# Patient Record
Sex: Female | Born: 1935 | Race: Black or African American | Hispanic: No | Smoking: Never smoker
Health system: Southern US, Community
[De-identification: ages and names within clinical notes are randomized; demographics above are authoritative.]

## PROBLEM LIST (undated history)

## (undated) DIAGNOSIS — I10 Essential (primary) hypertension: Secondary | ICD-10-CM

## (undated) DIAGNOSIS — C169 Malignant neoplasm of stomach, unspecified: Secondary | ICD-10-CM

## (undated) DIAGNOSIS — I639 Cerebral infarction, unspecified: Secondary | ICD-10-CM

## (undated) DIAGNOSIS — I4891 Unspecified atrial fibrillation: Secondary | ICD-10-CM

## (undated) DIAGNOSIS — E785 Hyperlipidemia, unspecified: Secondary | ICD-10-CM

## (undated) DIAGNOSIS — I2510 Atherosclerotic heart disease of native coronary artery without angina pectoris: Secondary | ICD-10-CM

## (undated) DIAGNOSIS — N183 Chronic kidney disease, stage 3 unspecified: Secondary | ICD-10-CM

---

## 2019-08-02 ENCOUNTER — Encounter (HOSPITAL_COMMUNITY)
Admission: EM | Disposition: A | Discharge status: Rehabilitation Facility | Payer: Self-pay | Source: Home / Self Care | Attending: Neurology

## 2019-08-02 ENCOUNTER — Emergency Department (HOSPITAL_COMMUNITY): Payer: MEDICARE | Admitting: Certified Registered Nurse Anesthetist

## 2019-08-02 ENCOUNTER — Inpatient Hospital Stay (HOSPITAL_COMMUNITY): Payer: MEDICARE

## 2019-08-02 ENCOUNTER — Emergency Department (HOSPITAL_COMMUNITY): Payer: MEDICARE

## 2019-08-02 ENCOUNTER — Inpatient Hospital Stay (HOSPITAL_COMMUNITY)
Admission: EM | Admit: 2019-08-02 | Discharge: 2019-08-07 | DRG: 024 | Disposition: A | Discharge status: Rehabilitation Facility | Payer: MEDICARE | Attending: Neurology | Admitting: Neurology

## 2019-08-02 ENCOUNTER — Encounter (HOSPITAL_COMMUNITY): Payer: Self-pay

## 2019-08-02 ENCOUNTER — Other Ambulatory Visit: Payer: Self-pay

## 2019-08-02 DIAGNOSIS — R471 Dysarthria and anarthria: Secondary | ICD-10-CM | POA: Diagnosis present

## 2019-08-02 DIAGNOSIS — R29705 NIHSS score 5: Secondary | ICD-10-CM | POA: Diagnosis present

## 2019-08-02 DIAGNOSIS — Z79899 Other long term (current) drug therapy: Secondary | ICD-10-CM

## 2019-08-02 DIAGNOSIS — R270 Ataxia, unspecified: Secondary | ICD-10-CM | POA: Diagnosis present

## 2019-08-02 DIAGNOSIS — I444 Left anterior fascicular block: Secondary | ICD-10-CM | POA: Diagnosis present

## 2019-08-02 DIAGNOSIS — Z8042 Family history of malignant neoplasm of prostate: Secondary | ICD-10-CM

## 2019-08-02 DIAGNOSIS — I63411 Cerebral infarction due to embolism of right middle cerebral artery: Secondary | ICD-10-CM | POA: Diagnosis present

## 2019-08-02 DIAGNOSIS — R4701 Aphasia: Secondary | ICD-10-CM | POA: Diagnosis present

## 2019-08-02 DIAGNOSIS — I480 Paroxysmal atrial fibrillation: Secondary | ICD-10-CM | POA: Diagnosis present

## 2019-08-02 DIAGNOSIS — K5900 Constipation, unspecified: Secondary | ICD-10-CM

## 2019-08-02 DIAGNOSIS — R414 Neurologic neglect syndrome: Secondary | ICD-10-CM | POA: Diagnosis present

## 2019-08-02 DIAGNOSIS — Z6828 Body mass index (BMI) 28.0-28.9, adult: Secondary | ICD-10-CM | POA: Diagnosis not present

## 2019-08-02 DIAGNOSIS — I63311 Cerebral infarction due to thrombosis of right middle cerebral artery: Secondary | ICD-10-CM

## 2019-08-02 DIAGNOSIS — I161 Hypertensive emergency: Secondary | ICD-10-CM | POA: Diagnosis not present

## 2019-08-02 DIAGNOSIS — I129 Hypertensive chronic kidney disease with stage 1 through stage 4 chronic kidney disease, or unspecified chronic kidney disease: Secondary | ICD-10-CM | POA: Diagnosis present

## 2019-08-02 DIAGNOSIS — E8809 Other disorders of plasma-protein metabolism, not elsewhere classified: Secondary | ICD-10-CM | POA: Diagnosis present

## 2019-08-02 DIAGNOSIS — I69398 Other sequelae of cerebral infarction: Secondary | ICD-10-CM | POA: Diagnosis present

## 2019-08-02 DIAGNOSIS — I639 Cerebral infarction, unspecified: Secondary | ICD-10-CM | POA: Diagnosis present

## 2019-08-02 DIAGNOSIS — I4891 Unspecified atrial fibrillation: Secondary | ICD-10-CM | POA: Diagnosis present

## 2019-08-02 DIAGNOSIS — R0989 Other specified symptoms and signs involving the circulatory and respiratory systems: Secondary | ICD-10-CM | POA: Diagnosis not present

## 2019-08-02 DIAGNOSIS — Z85028 Personal history of other malignant neoplasm of stomach: Secondary | ICD-10-CM | POA: Diagnosis not present

## 2019-08-02 DIAGNOSIS — R4702 Dysphasia: Secondary | ICD-10-CM | POA: Diagnosis present

## 2019-08-02 DIAGNOSIS — Z20822 Contact with and (suspected) exposure to covid-19: Secondary | ICD-10-CM | POA: Diagnosis present

## 2019-08-02 DIAGNOSIS — I2510 Atherosclerotic heart disease of native coronary artery without angina pectoris: Secondary | ICD-10-CM | POA: Diagnosis present

## 2019-08-02 DIAGNOSIS — I10 Essential (primary) hypertension: Secondary | ICD-10-CM | POA: Diagnosis not present

## 2019-08-02 DIAGNOSIS — Z7901 Long term (current) use of anticoagulants: Secondary | ICD-10-CM | POA: Diagnosis not present

## 2019-08-02 DIAGNOSIS — H5340 Unspecified visual field defects: Secondary | ICD-10-CM | POA: Diagnosis present

## 2019-08-02 DIAGNOSIS — N183 Chronic kidney disease, stage 3 unspecified: Secondary | ICD-10-CM | POA: Diagnosis present

## 2019-08-02 DIAGNOSIS — Z888 Allergy status to other drugs, medicaments and biological substances status: Secondary | ICD-10-CM | POA: Diagnosis not present

## 2019-08-02 DIAGNOSIS — Z886 Allergy status to analgesic agent status: Secondary | ICD-10-CM | POA: Diagnosis not present

## 2019-08-02 DIAGNOSIS — E785 Hyperlipidemia, unspecified: Secondary | ICD-10-CM | POA: Diagnosis present

## 2019-08-02 DIAGNOSIS — E7800 Pure hypercholesterolemia, unspecified: Secondary | ICD-10-CM | POA: Diagnosis not present

## 2019-08-02 DIAGNOSIS — R29810 Facial weakness: Secondary | ICD-10-CM | POA: Diagnosis present

## 2019-08-02 DIAGNOSIS — Z7902 Long term (current) use of antithrombotics/antiplatelets: Secondary | ICD-10-CM | POA: Diagnosis not present

## 2019-08-02 DIAGNOSIS — N1832 Chronic kidney disease, stage 3b: Secondary | ICD-10-CM | POA: Diagnosis present

## 2019-08-02 DIAGNOSIS — R2689 Other abnormalities of gait and mobility: Secondary | ICD-10-CM | POA: Diagnosis present

## 2019-08-02 DIAGNOSIS — Z8673 Personal history of transient ischemic attack (TIA), and cerebral infarction without residual deficits: Secondary | ICD-10-CM

## 2019-08-02 DIAGNOSIS — E440 Moderate protein-calorie malnutrition: Secondary | ICD-10-CM | POA: Diagnosis present

## 2019-08-02 DIAGNOSIS — D72829 Elevated white blood cell count, unspecified: Secondary | ICD-10-CM | POA: Diagnosis present

## 2019-08-02 DIAGNOSIS — R413 Other amnesia: Secondary | ICD-10-CM | POA: Diagnosis present

## 2019-08-02 DIAGNOSIS — I4892 Unspecified atrial flutter: Secondary | ICD-10-CM | POA: Diagnosis present

## 2019-08-02 DIAGNOSIS — Z885 Allergy status to narcotic agent status: Secondary | ICD-10-CM | POA: Diagnosis not present

## 2019-08-02 DIAGNOSIS — I6389 Other cerebral infarction: Secondary | ICD-10-CM | POA: Diagnosis not present

## 2019-08-02 DIAGNOSIS — E46 Unspecified protein-calorie malnutrition: Secondary | ICD-10-CM | POA: Diagnosis not present

## 2019-08-02 DIAGNOSIS — I63511 Cerebral infarction due to unspecified occlusion or stenosis of right middle cerebral artery: Secondary | ICD-10-CM | POA: Diagnosis not present

## 2019-08-02 DIAGNOSIS — D62 Acute posthemorrhagic anemia: Secondary | ICD-10-CM | POA: Diagnosis present

## 2019-08-02 DIAGNOSIS — K5901 Slow transit constipation: Secondary | ICD-10-CM | POA: Diagnosis not present

## 2019-08-02 HISTORY — PX: IR CT HEAD LTD: IMG2386

## 2019-08-02 HISTORY — DX: Malignant neoplasm of stomach, unspecified: C16.9

## 2019-08-02 HISTORY — PX: IR PERCUTANEOUS ART THROMBECTOMY/INFUSION INTRACRANIAL INC DIAG ANGIO: IMG6087

## 2019-08-02 HISTORY — DX: Essential (primary) hypertension: I10

## 2019-08-02 HISTORY — DX: Chronic kidney disease, stage 3 unspecified: N18.30

## 2019-08-02 HISTORY — PX: RADIOLOGY WITH ANESTHESIA: SHX6223

## 2019-08-02 HISTORY — DX: Cerebral infarction, unspecified: I63.9

## 2019-08-02 HISTORY — DX: Hyperlipidemia, unspecified: E78.5

## 2019-08-02 HISTORY — DX: Unspecified atrial fibrillation: I48.91

## 2019-08-02 HISTORY — DX: Atherosclerotic heart disease of native coronary artery without angina pectoris: I25.10

## 2019-08-02 LAB — I-STAT CHEM 8, ED
BUN: 8 mg/dL (ref 8–23)
Calcium, Ion: 1.18 mmol/L (ref 1.15–1.40)
Chloride: 101 mmol/L (ref 98–111)
Creatinine, Ser: 1.2 mg/dL — ABNORMAL HIGH (ref 0.44–1.00)
Glucose, Bld: 120 mg/dL — ABNORMAL HIGH (ref 70–99)
HCT: 37 % (ref 36.0–46.0)
Hemoglobin: 12.6 g/dL (ref 12.0–15.0)
Potassium: 3.8 mmol/L (ref 3.5–5.1)
Sodium: 142 mmol/L (ref 135–145)
TCO2: 28 mmol/L (ref 22–32)

## 2019-08-02 LAB — DIFFERENTIAL
Abs Immature Granulocytes: 0.02 10*3/uL (ref 0.00–0.07)
Basophils Absolute: 0.1 10*3/uL (ref 0.0–0.1)
Basophils Relative: 1 %
Eosinophils Absolute: 0.1 10*3/uL (ref 0.0–0.5)
Eosinophils Relative: 1 %
Immature Granulocytes: 0 %
Lymphocytes Relative: 22 %
Lymphs Abs: 1.5 10*3/uL (ref 0.7–4.0)
Monocytes Absolute: 0.7 10*3/uL (ref 0.1–1.0)
Monocytes Relative: 11 %
Neutro Abs: 4.4 10*3/uL (ref 1.7–7.7)
Neutrophils Relative %: 65 %

## 2019-08-02 LAB — URINALYSIS, ROUTINE W REFLEX MICROSCOPIC
Bilirubin Urine: NEGATIVE
Glucose, UA: NEGATIVE mg/dL
Hgb urine dipstick: NEGATIVE
Ketones, ur: NEGATIVE mg/dL
Leukocytes,Ua: NEGATIVE
Nitrite: NEGATIVE
Protein, ur: NEGATIVE mg/dL
Specific Gravity, Urine: 1.009 (ref 1.005–1.030)
pH: 8 (ref 5.0–8.0)

## 2019-08-02 LAB — CBC
HCT: 38.7 % (ref 36.0–46.0)
Hemoglobin: 12.3 g/dL (ref 12.0–15.0)
MCH: 28.6 pg (ref 26.0–34.0)
MCHC: 31.8 g/dL (ref 30.0–36.0)
MCV: 90 fL (ref 80.0–100.0)
Platelets: 249 10*3/uL (ref 150–400)
RBC: 4.3 MIL/uL (ref 3.87–5.11)
RDW: 14 % (ref 11.5–15.5)
WBC: 6.7 10*3/uL (ref 4.0–10.5)
nRBC: 0 % (ref 0.0–0.2)

## 2019-08-02 LAB — COMPREHENSIVE METABOLIC PANEL
ALT: 18 U/L (ref 0–44)
AST: 28 U/L (ref 15–41)
Albumin: 4.1 g/dL (ref 3.5–5.0)
Alkaline Phosphatase: 85 U/L (ref 38–126)
Anion gap: 11 (ref 5–15)
BUN: 9 mg/dL (ref 8–23)
CO2: 26 mmol/L (ref 22–32)
Calcium: 9.8 mg/dL (ref 8.9–10.3)
Chloride: 104 mmol/L (ref 98–111)
Creatinine, Ser: 1.2 mg/dL — ABNORMAL HIGH (ref 0.44–1.00)
GFR calc Af Amer: 48 mL/min — ABNORMAL LOW (ref >60)
GFR calc non Af Amer: 41 mL/min — ABNORMAL LOW (ref >60)
Glucose, Bld: 126 mg/dL — ABNORMAL HIGH (ref 70–99)
Potassium: 4.2 mmol/L (ref 3.5–5.1)
Sodium: 141 mmol/L (ref 135–145)
Total Bilirubin: 0.5 mg/dL (ref 0.3–1.2)
Total Protein: 7.4 g/dL (ref 6.5–8.1)

## 2019-08-02 LAB — PROTIME-INR
INR: 1 (ref 0.8–1.2)
Prothrombin Time: 13.1 seconds (ref 11.4–15.2)

## 2019-08-02 LAB — CBG MONITORING, ED: Glucose-Capillary: 125 mg/dL — ABNORMAL HIGH (ref 70–99)

## 2019-08-02 LAB — APTT: aPTT: 30 seconds (ref 24–36)

## 2019-08-02 LAB — SARS CORONAVIRUS 2 BY RT PCR (HOSPITAL ORDER, PERFORMED IN ~~LOC~~ HOSPITAL LAB): SARS Coronavirus 2: NEGATIVE

## 2019-08-02 IMAGING — CT CT HEAD CODE STROKE
3 series · 16 of 47 positions shown, 19 images · non-contrast
Comparison: None.

CLINICAL DATA: Code stroke. Worsening confusion. Recent stroke
treated at outside institution.

EXAM:
CT HEAD WITHOUT CONTRAST
TECHNIQUE: Contiguous axial images were obtained from the base of the skull
through the vertex without intravenous contrast.

[Series 2: head wo · axial · 0.47mm/px · z∈[-177,-52]mm · 10 of 30 slices shown, 13 images]
[im 3/30  brain]
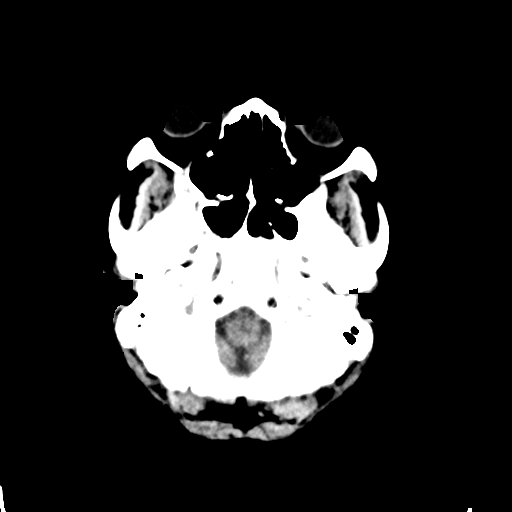
[im 3/30  bone]
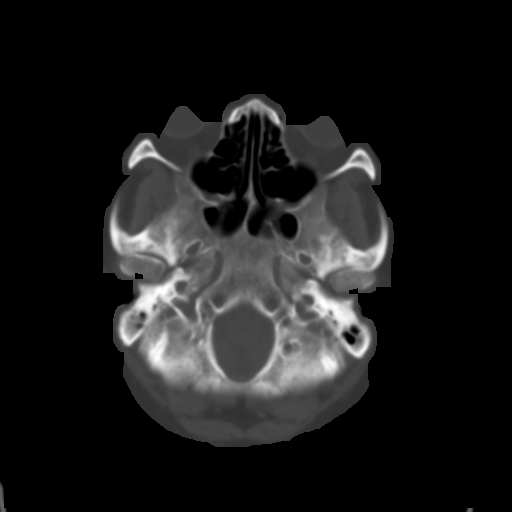
[im 6/30  brain]
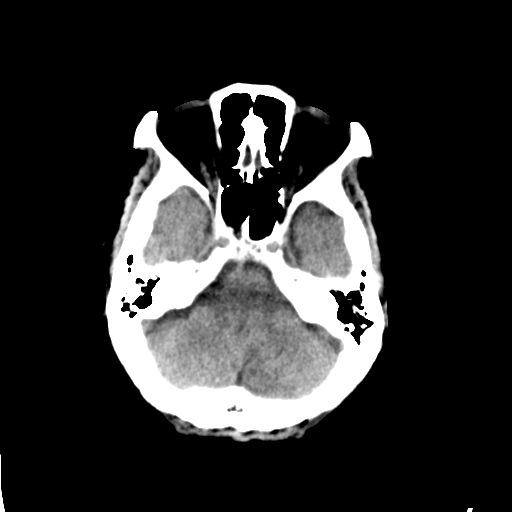
[im 9/30  brain]
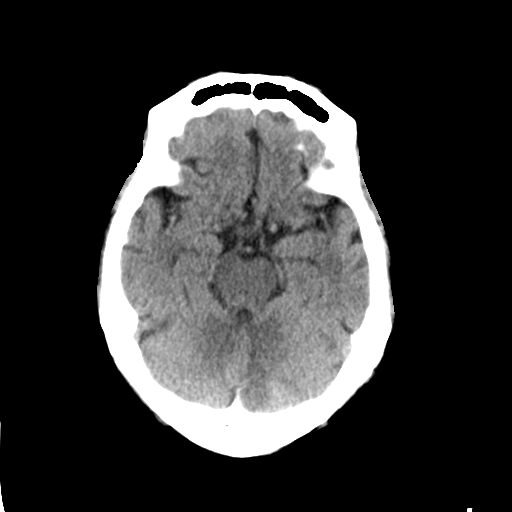
[im 11/30  brain]
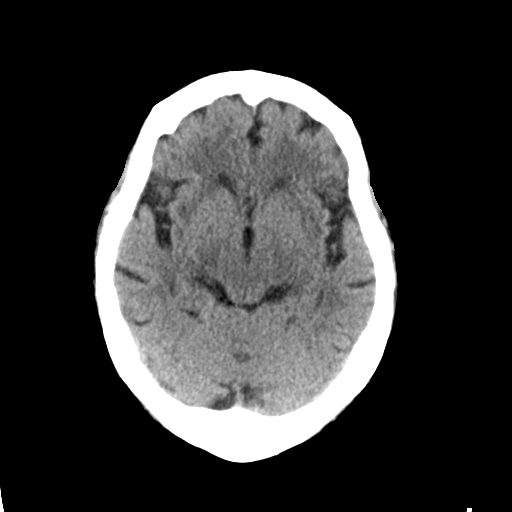
[im 14/30  brain]
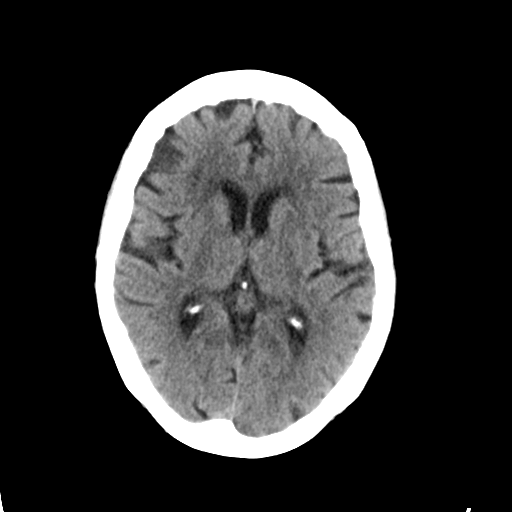
[im 14/30  bone]
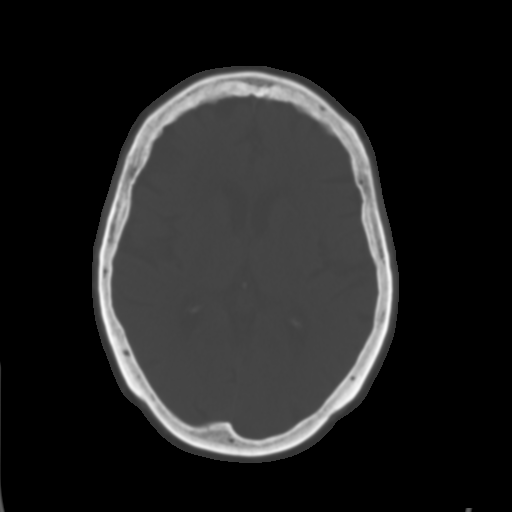
[im 17/30  brain]
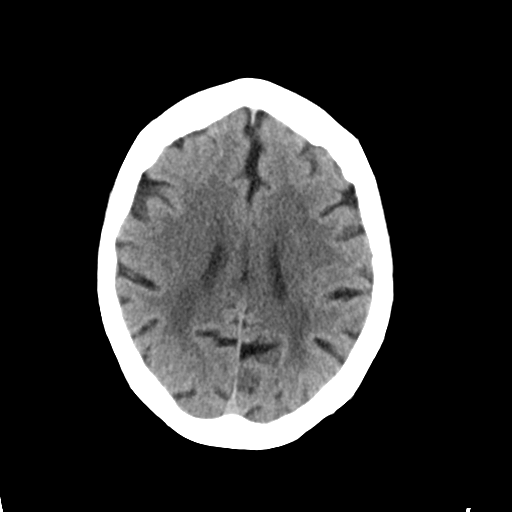
[im 20/30  brain]
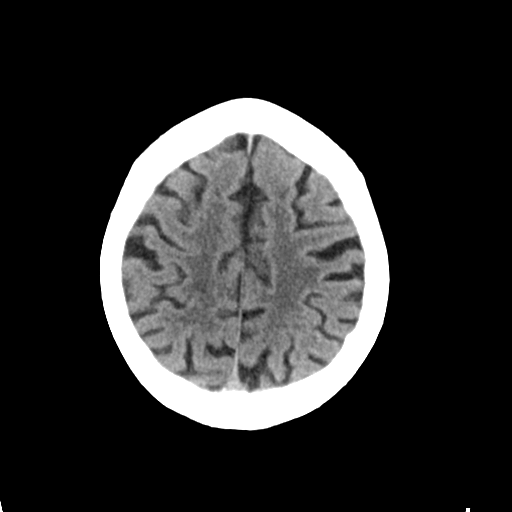
[im 23/30  brain]
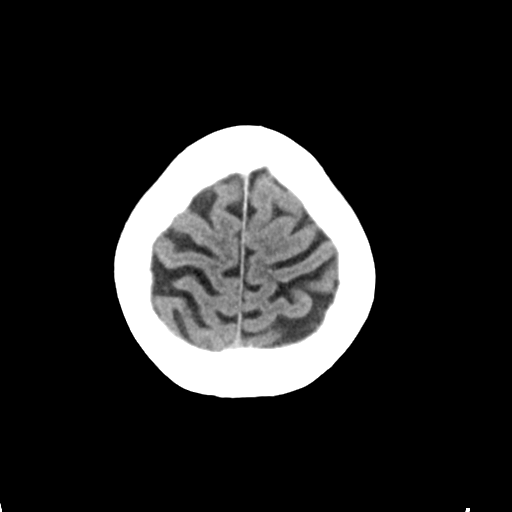
[im 25/30  brain]
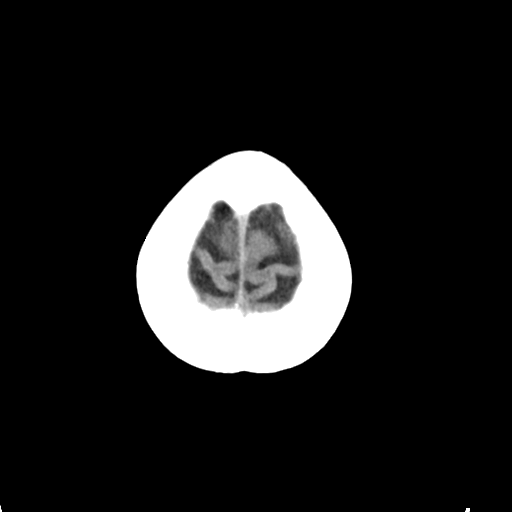
[im 25/30  bone]
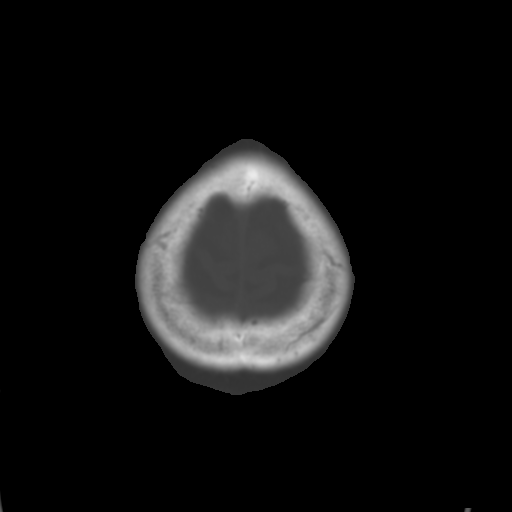
[im 28/30  brain]
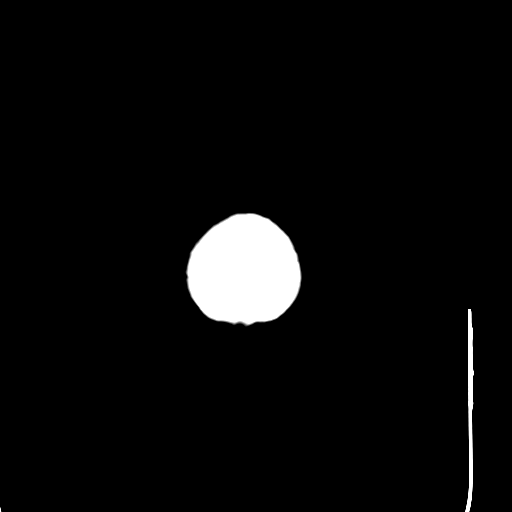

[Series 5: coronal soft tissue · coronal · 0.31mm/px · 3 of 67 slices shown]
[im 23/67  brain]
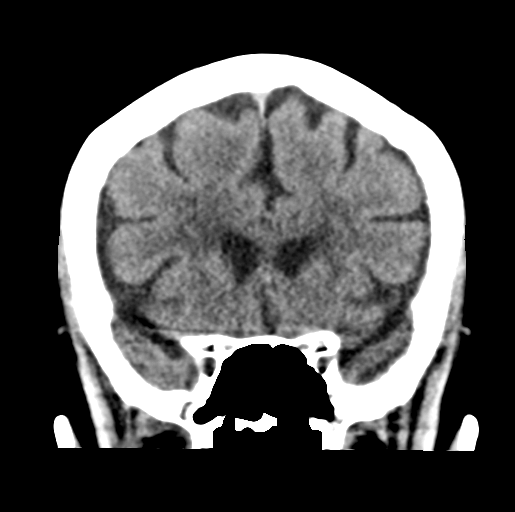
[im 30/67  brain]
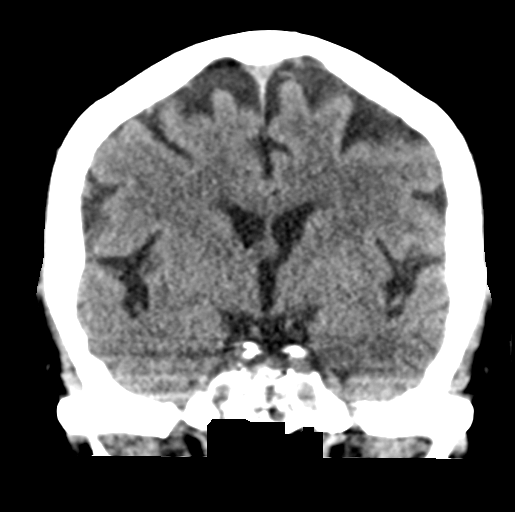
[im 37/67  brain]
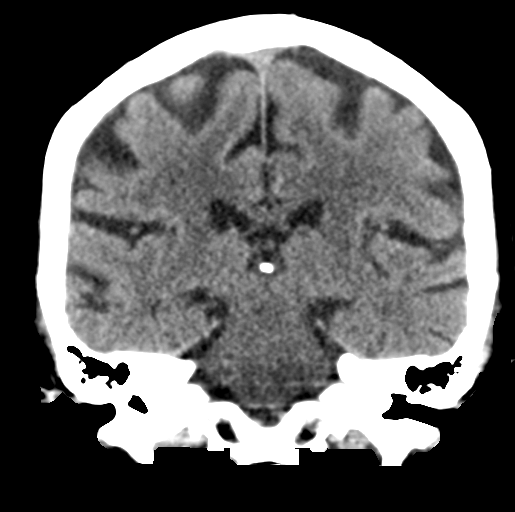

[Series 6: sagittal soft tissue · sagittal · 0.32mm/px · 3 of 54 slices shown]
[im 18/54  brain]
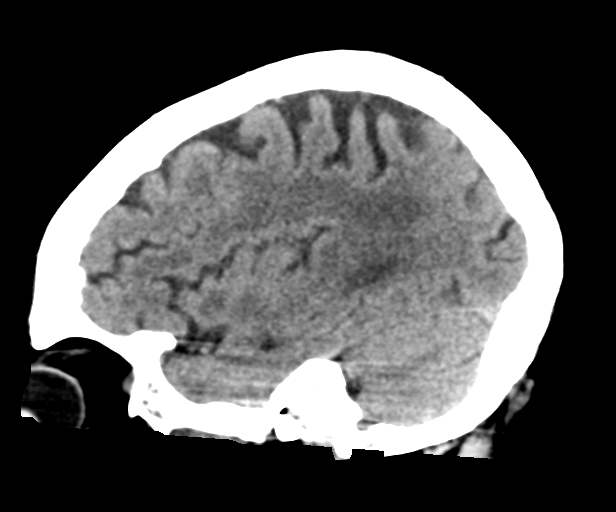
[im 27/54  brain]
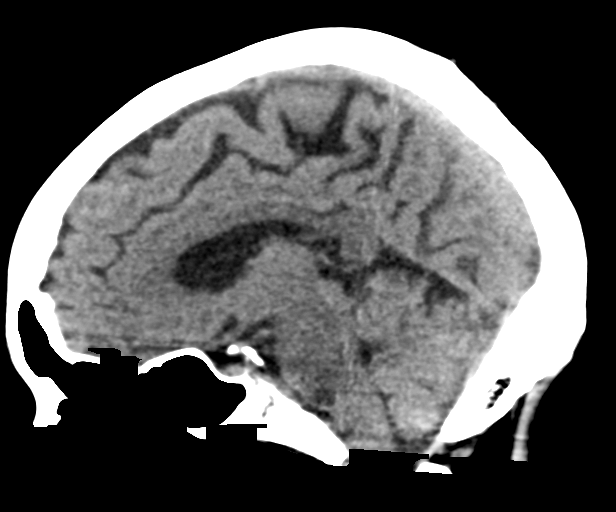
[im 36/54  brain]
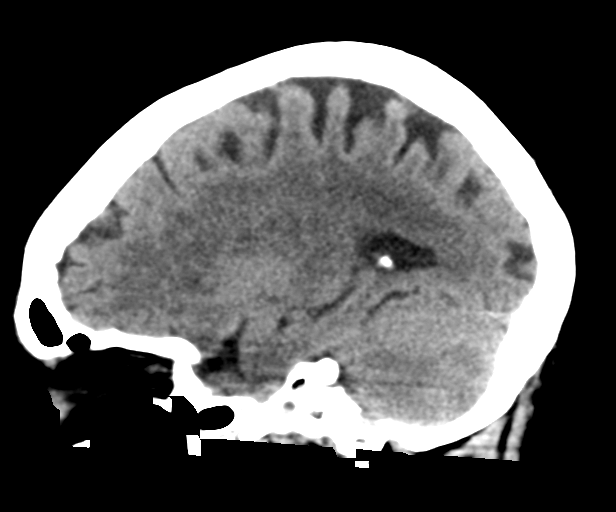

[16 of 47 positions shown; findings below may reference images not displayed]

FINDINGS: Brain: Age related volume loss. Chronic small-vessel ischemic
changes of the cerebral hemispheric white matter. Probable
low-density in the left cerebellum that could be an acute or
subacute infarction. No sign of hemorrhage, mass, hydrocephalus or
extra-axial collection.

Vascular: There is atherosclerotic calcification of the major
vessels at the base of the brain.

Skull: Negative

Sinuses/Orbits: Clear/normal

Other: None

ASPECTS (Alberta Stroke Program Early CT Score)

- Ganglionic level infarction (caudate, lentiform nuclei, internal
capsule, insula, M1-M3 cortex): 7

- Supraganglionic infarction (M4-M6 cortex): 3

Total score (0-10 with 10 being normal): 10
IMPRESSION: 1. Chronic small-vessel ischemic changes of the cerebral hemispheric
white matter. Probable acute or subacute infarction in the left
cerebellar hemisphere. No mass effect or hemorrhage.
2. These results were called by telephone at the time of
interpretation on [DATE] at [DATE] to provider Dr. CONDO, who
verbally acknowledged these results.
3. ASPECTS is

## 2019-08-02 IMAGING — CT CT ANGIO NECK
2 of 7 series · 8 of 33 positions shown · IV contrast (omnipaque)
Comparison: Head CT earlier same day

CLINICAL DATA: Confusion.  Abnormal head CT.

EXAM:
CT ANGIOGRAPHY HEAD AND NECK
TECHNIQUE: Multidetector CT imaging of the head and neck was performed using
the standard protocol during bolus administration of intravenous
contrast. Multiplanar CT image reconstructions and MIPs were
obtained to evaluate the vascular anatomy. Carotid stenosis
measurements (when applicable) are obtained utilizing NASCET
criteria, using the distal internal carotid diameter as the
denominator.
CONTRAST:  100mL OMNIPAQUE IOHEXOL 350 MG/ML SOLN

[Series 5: cta head neck · axial · 0.48mm/px · z∈[-240,-122]mm · 2 of 177 slices shown]
[im 59/177  soft-tissue]
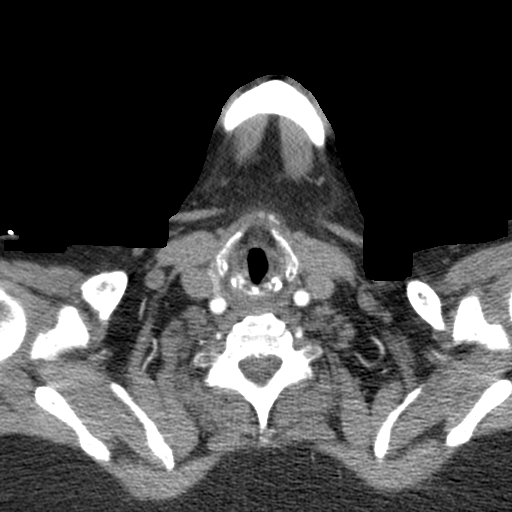
[im 118/177  soft-tissue]
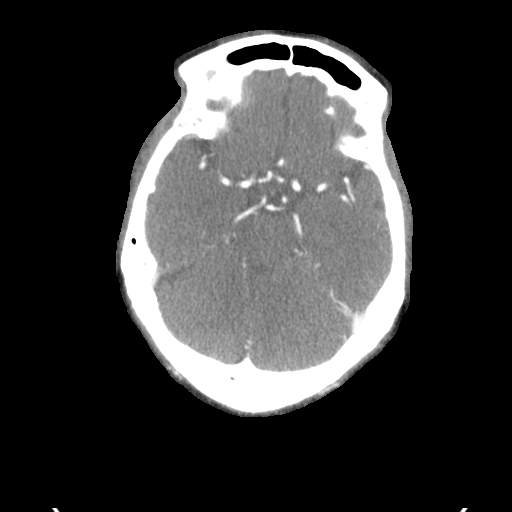

[Series 7: ax thin · axial · 0.40mm/px · z∈[-305,-55]mm · 6 of 351 slices shown]
[im 51/351  soft-tissue]
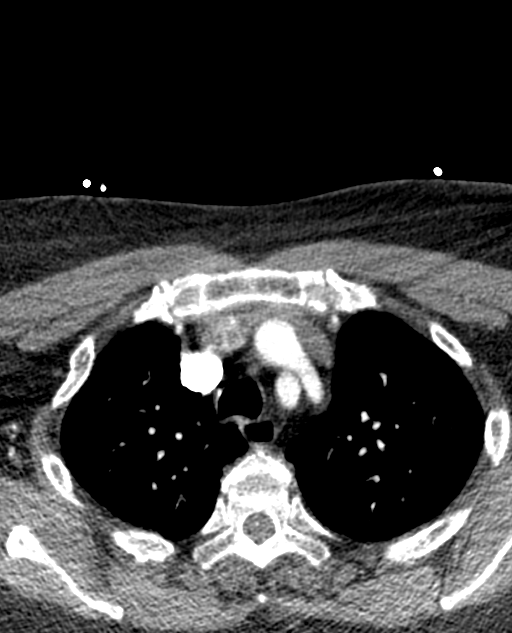
[im 101/351  bone]
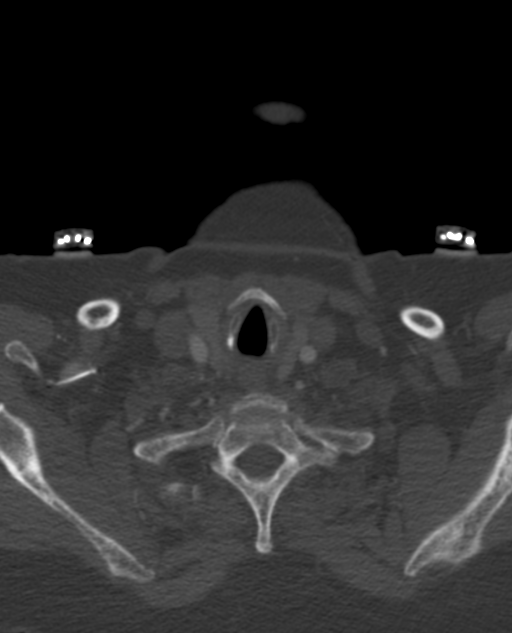
[im 151/351  soft-tissue]
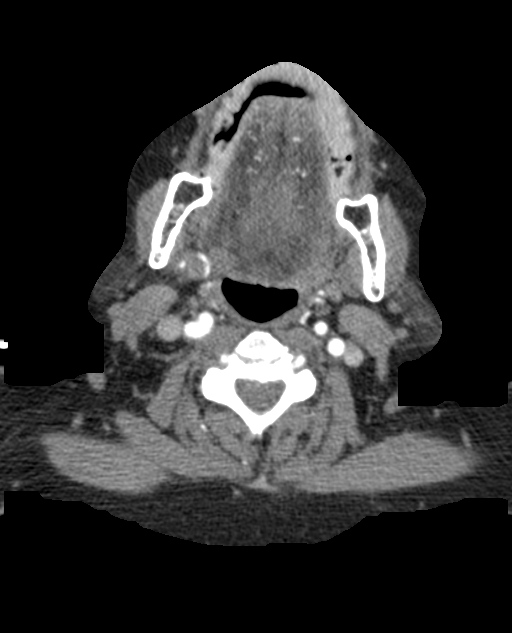
[im 201/351  bone]
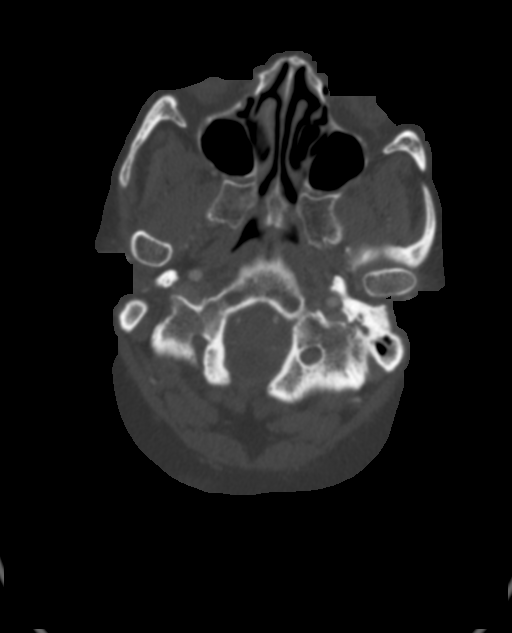
[im 251/351  soft-tissue]
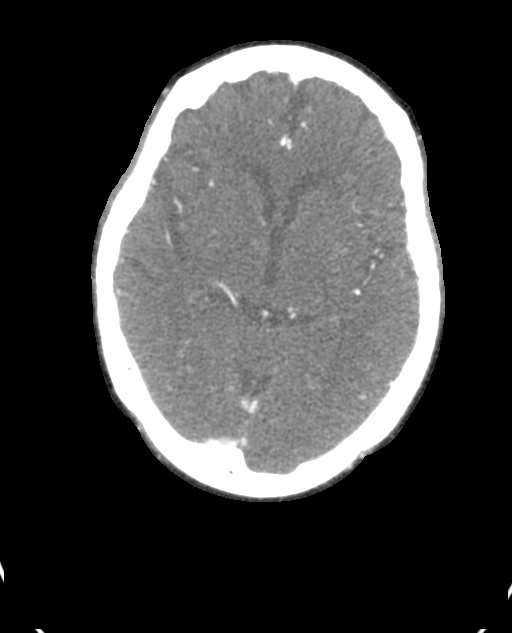
[im 301/351  bone]
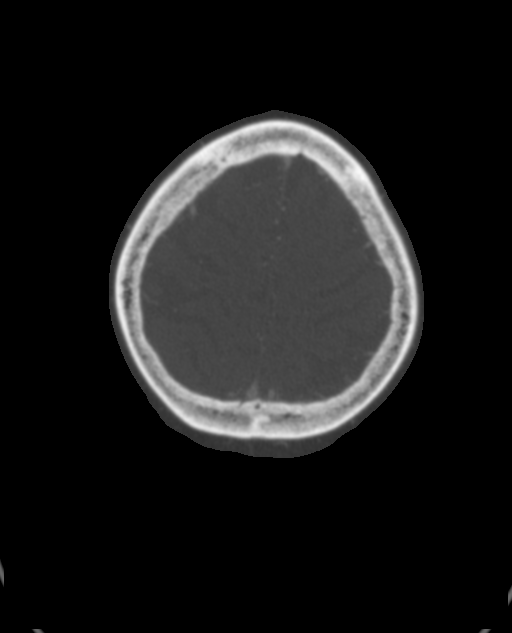

[8 of 33 positions shown; findings below may reference images not displayed]

FINDINGS: CTA NECK FINDINGS

Aortic arch: Aortic atherosclerosis. No aneurysm or dissection.
Branching pattern is normal without origin stenosis.

Right carotid system: Common carotid artery widely patent to the
bifurcation. Carotid bifurcation is normal without atherosclerotic
disease or stenosis. Cervical ICA is widely patent.

Left carotid system: Common carotid artery widely patent to the
bifurcation. Carotid bifurcation is normal. Cervical ICA is normal.

Vertebral arteries: Both vertebral artery origins are widely patent.
The left is dominant. Both vertebral arteries are patent through the
cervical region to the foramen magnum.

Skeleton: Ordinary cervical spondylosis.

Other neck: No mass or lymphadenopathy.

Upper chest: Normal

Review of the MIP images confirms the above findings

CTA HEAD FINDINGS

Anterior circulation: Both internal carotid arteries widely patent
through the skull base and siphon regions. The anterior and middle
cerebral vessels are patent proximally. There is occlusion of a
proximal right M2 branch vessel. No other anterior circulation
finding. No aneurysm or vascular malformation.

Posterior circulation: Both vertebral arteries are patent through
the foramen magnum to the basilar. Patent posterior communicating
arteries on each side. No basilar stenosis. Superior cerebellar and
posterior cerebral arteries show flow. Primary anterior circulation
supply of the posterior cerebral arteries.

Venous sinuses: Patent and normal.

Anatomic variants: None significant.

Review of the MIP images confirms the above findings
IMPRESSION: Occlusion of a right proximal M2 branch. No other abnormal anterior
circulation finding.

Posterior circulation intracranial branch vessels are intact.

No carotid bifurcation disease.  No vertebral artery origin disease.

Minimal aortic atherosclerosis.

These results were called by telephone at the time of interpretation
on [DATE] at [DATE] to Dr. HESSAM,who verbally acknowledged these
results.

## 2019-08-02 IMAGING — XA IR PERCUTANEOUS ART THORMBECTOMY/INFUSION INTRACRANIAL INCLUDE D
8 series · 12 of 24 positions shown · non-contrast
Comparison: CT/CT angiogram of the head and neck [DATE]

INDICATION: 87-year-old female with past medical history significant for recent
cerebellar stroke, atrial fibrillation, hypertension and GI cancer.
She presented to an outside hospital with confusion and aphasia,
NIHSS 5. Head CT showed no acute right MCA territory infarct with
known left cerebellar stroke. CT angiogram of the head and neck
showed a right M2/MCA occlusion. She was taken to our service for an
emergency diagnostic cerebral angiogram and mechanical thrombectomy.

EXAM:
Diagnostic cerebral angiogram and mechanical thrombectomy.
TECHNIQUE: Informed written consent was obtained from the patient's daughter
after a thorough discussion of the procedural risks, benefits and
alternatives. All questions were addressed. Maximal Sterile Barrier
Technique was utilized including caps, mask, sterile gowns, sterile
gloves, sterile drape, hand hygiene and skin antiseptic. A timeout
was performed prior to the initiation of the procedure.

[Series 1: cerebral care 2 · 2 acquisitions, 1 frame shown (1 of 3)]
[im 1/2]
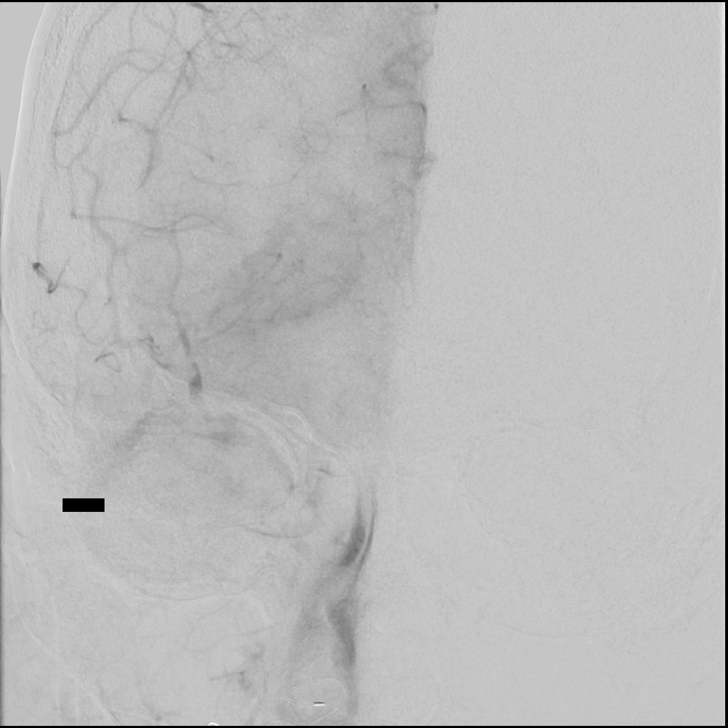

[Series 2: single · 1 of 2 slices shown]
[im 1/2]
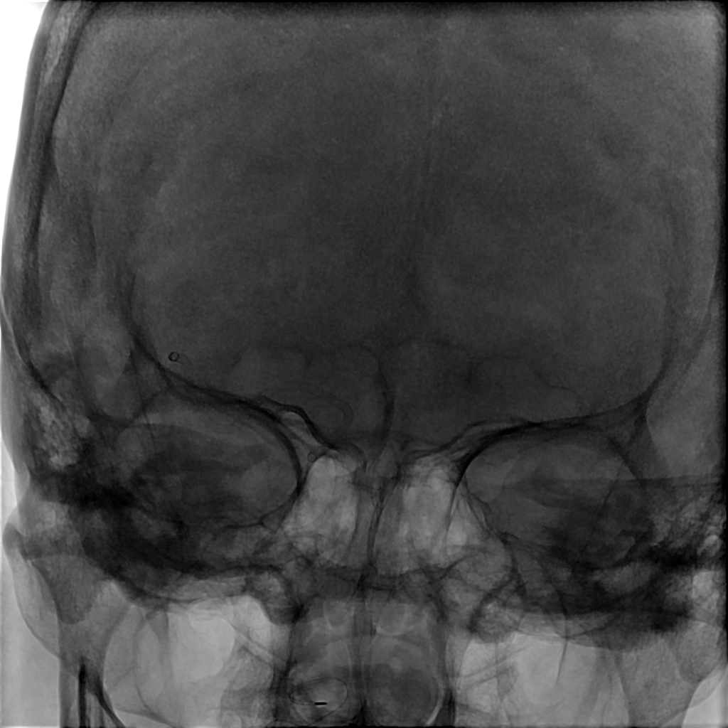

[Series 3: cerebral care 2 · 2 acquisitions, 1 frame shown (2 of 3)]
[im 1/2]
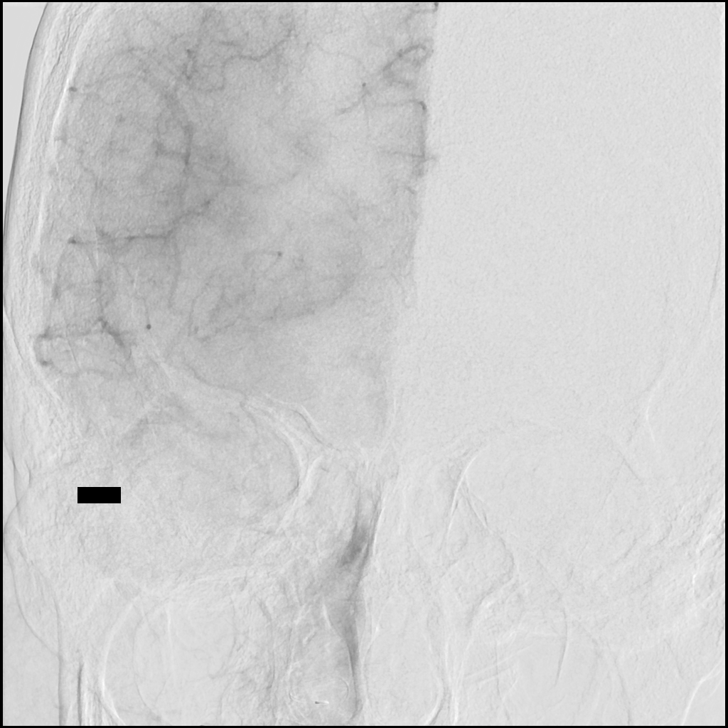

[Series 4: cerebral care 2 · 2 acquisitions, 1 frame shown (3 of 3)]
[im 1/2]
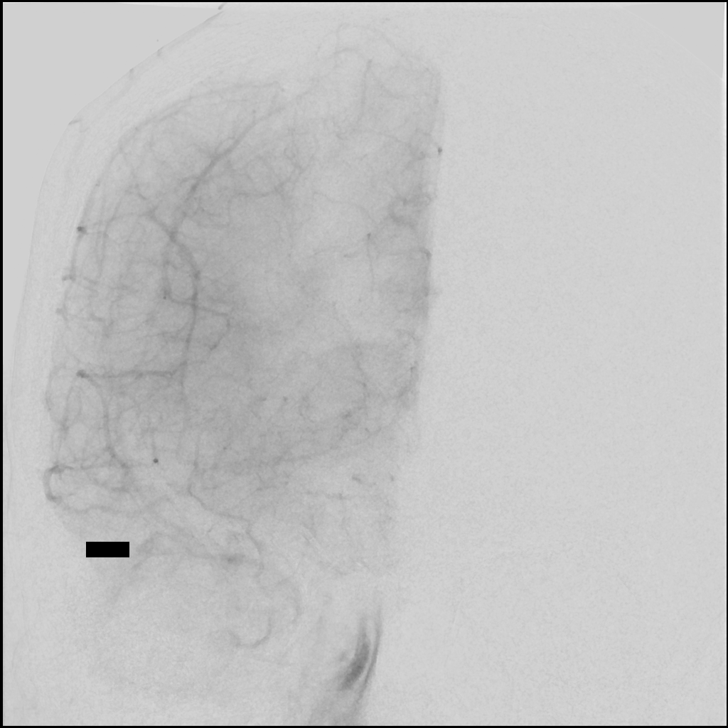

[Series 5: <mpr range> · axial · 5.0mm · 0.37mm/px · z∈[-244,-121]mm · 4 of 33 slices shown]
[im 3/33]
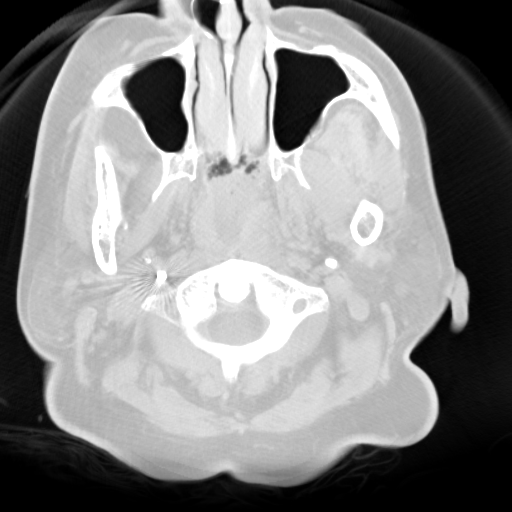
[im 13/33]
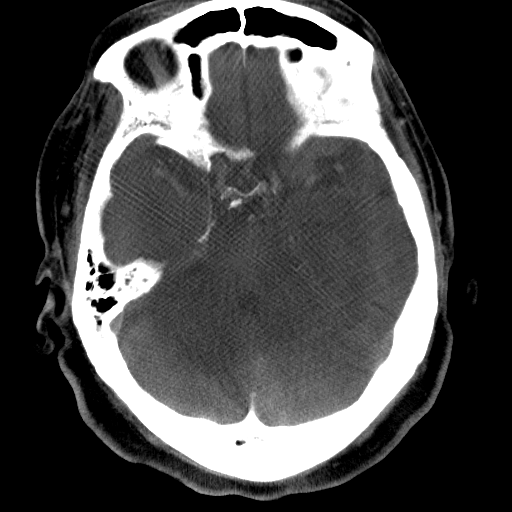
[im 20/33]
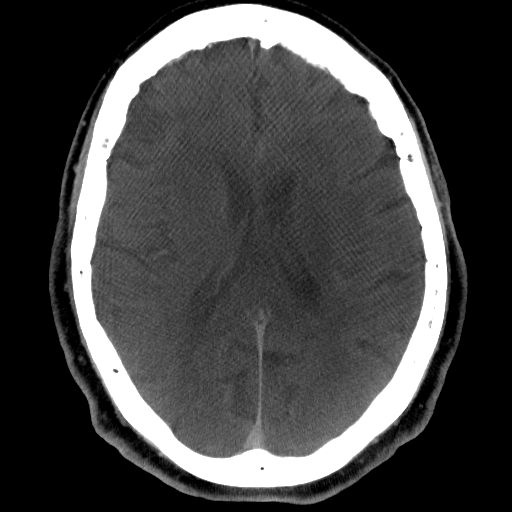
[im 28/33]
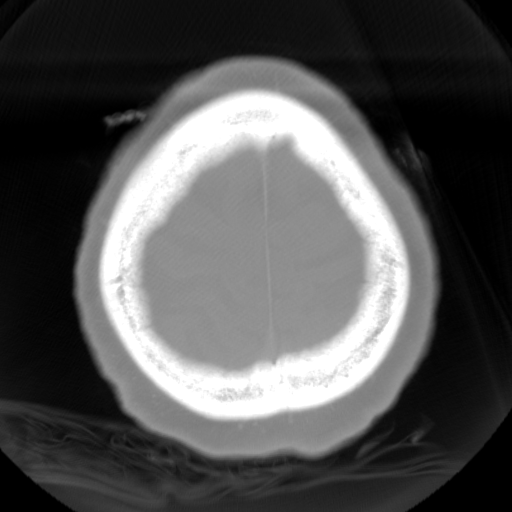

[Series 5: (id) bleed · 1 of 496 frames shown]
[frame 1/496]
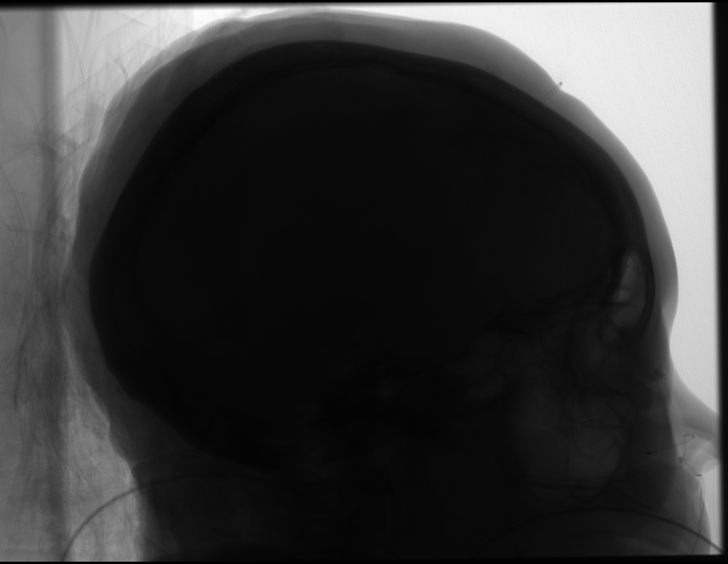

[Series 6: fl neuro n · 2 acquisitions, 2 frames shown]
[im 1/2]
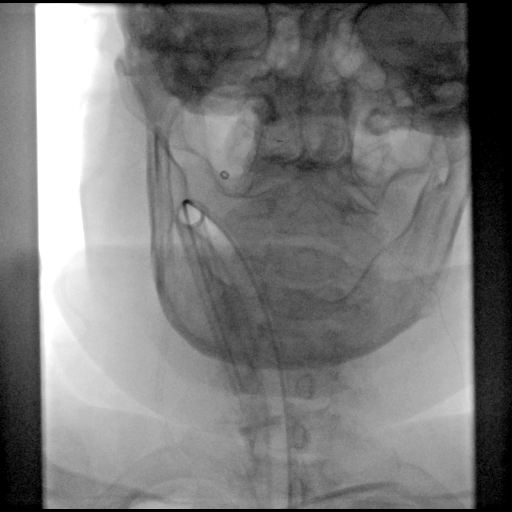
[im 1/2]
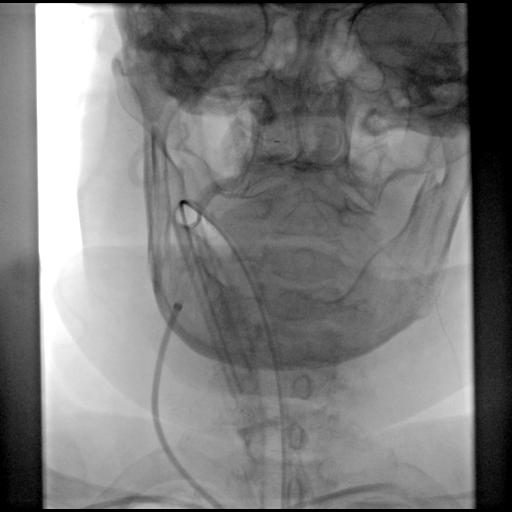

[Series 300: dr. (person_name) · 1 of 6 slices shown]
[im 6/6]
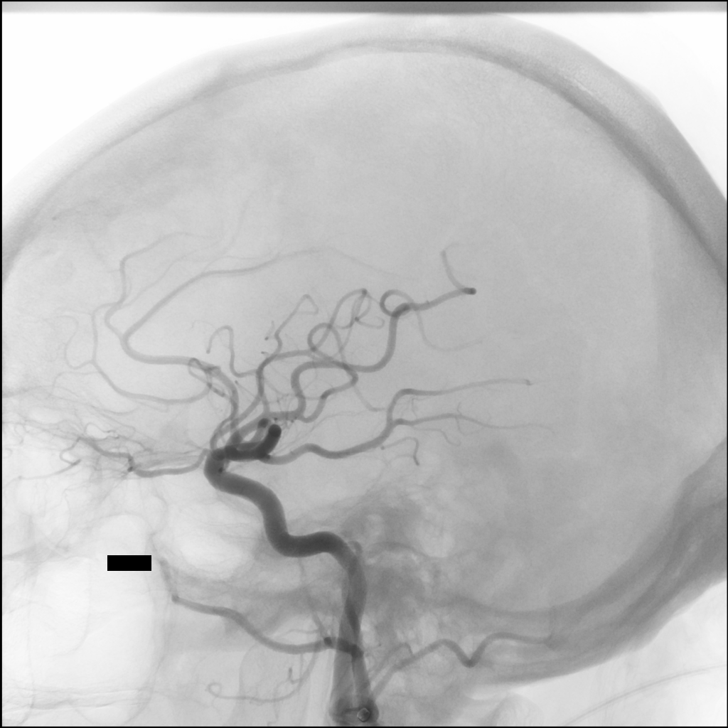

[12 of 24 positions shown; findings below may reference images not displayed]

MEDICATIONS:
Ancef 2 gm IV. The antibiotic was administered within 1 hour of the
procedure

ANESTHESIA/SEDATION:
The procedure was performed in the general anesthesia.

FLUOROSCOPY TIME:  Fluoroscopy Time: 11 minutes (360.9 mGy).

COMPLICATIONS:
None immediate.
Real-time ultrasound guidance was utilized for vascular access
including the acquisition of a permanent ultrasound image
documenting patency of the accessed vessel.

Using the modified Seldinger technique and a micropuncture kit,
access was gained to the right radial artery at the wrist and a 7
French sheath was placed. Slow intra arterial infusion of 300 mcg
nitroglicerin diluted in patient's own blood was performed. No
significant fluctuation in patient's blood pressure seen. Then, a
right radial artery roadmap was obtained via sheath side port.
Normal brachial artery branching pattern seen. No significant
anatomical variation. The right radial artery caliber is adequate
for vascular access.

Then, a 6 CARMA 2 catheter was navigated over a
inch a Terumo Glidewire into the right subclavian artery and then
placed into the right common carotid artery. The wire was removed
and a roadmap of the head and neck was obtained. Then, the catheter
was exchanged over the wire for an infinity long sheath which was
placed the cervical segment of the right ICA. Frontal and lateral
angiograms of the head were obtained.
FINDINGS: A subocclusive clot is seen within the proximal right M2/MCA
posterior division branch with an occlusive clot seen in in the mid
M2 segment extending to the M3.

PROCEDURE:
Under biplane roadmap, coaxial navigation of a Zoom 55 aspiration
catheter over a synchro support microguidewire into the right M2/MCA
posterior division branch. The wire was removed and aspiration
catheter was connected to an aspiration pump. Continuous aspiration
was performed for 4 minutes. The aspiration catheter was then
retracted under continuous aspiration. The guiding catheter was
aspirated and brisk flow was noted.

Right ICA angiogram was obtained with magnified frontal and lateral
view of the head showing complete recanalization of the right M2/MCA
([9F]). Frontal and lateral angiograms with views of the entire
head showed no evidence of thromboembolic complication.

Flat panel CT of the head was obtained and post processed in a
separate workstation with concurrent attending physician
supervision. Selected images were sent to PACS. No hemorrhagic
complication noted. Hyperdensity seen in the medial aspect of the
left inferior cerebellar hemisphere, in the location of known
stroke, likely representing contrast staining.

The catheter was subsequently withdrawn.
IMPRESSION: 1. Successful mechanical thrombectomy for treatment of a right
M2/MCA posterior division branch occlusion via right radial
approach. A total of 1 pass with direct contact aspiration with
complete recanalization ([9F]).
2. No embolus to new or distal territory.
3. No hemorrhagic complication.

PLAN:
1. ICU level of care.
2. SBP 120-140 mmHg.
3. Follow-up imaging within 24 hours.

## 2019-08-02 SURGERY — IR WITH ANESTHESIA
Anesthesia: General

## 2019-08-02 MED ORDER — TIROFIBAN HCL IN NACL 5-0.9 MG/100ML-% IV SOLN
INTRAVENOUS | Status: AC
  Filled 2019-08-02: qty 100

## 2019-08-02 MED ORDER — IOHEXOL 240 MG/ML SOLN
INTRAMUSCULAR | Status: AC
  Filled 2019-08-02: qty 200

## 2019-08-02 MED ORDER — ACETAMINOPHEN 650 MG RE SUPP: 650.0000 mg | RECTAL | Status: DC | PRN

## 2019-08-02 MED ORDER — ACETAMINOPHEN 160 MG/5ML PO SOLN: 650.0000 mg | ORAL | Status: DC | PRN

## 2019-08-02 MED ORDER — ASPIRIN 81 MG PO CHEW
CHEWABLE_TABLET | ORAL | Status: AC
  Filled 2019-08-02: qty 1

## 2019-08-02 MED ORDER — IOHEXOL 350 MG/ML SOLN
100.0000 mL | Freq: Once | INTRAVENOUS | Status: AC | PRN
  Administered 2019-08-02: 100 mL via INTRAVENOUS

## 2019-08-02 MED ORDER — LACTATED RINGERS IV SOLN: INTRAVENOUS | Status: DC | PRN

## 2019-08-02 MED ORDER — ONDANSETRON HCL 4 MG/2ML IJ SOLN: 4.0000 mg | Freq: Four times a day (QID) | INTRAMUSCULAR | Status: DC | PRN

## 2019-08-02 MED ORDER — PHENOL 1.4 % MT LIQD: 1.0000 | OROMUCOSAL | Status: DC | PRN

## 2019-08-02 MED ORDER — ACETAMINOPHEN 325 MG PO TABS
650.0000 mg | ORAL_TABLET | ORAL | Status: DC | PRN
  Administered 2019-08-04: 650 mg via ORAL
  Filled 2019-08-02: qty 2

## 2019-08-02 MED ORDER — SODIUM CHLORIDE 0.9% FLUSH: 3.0000 mL | Freq: Once | INTRAVENOUS | Status: DC

## 2019-08-02 MED ORDER — FUROSEMIDE 20 MG PO TABS: 20.0000 mg | ORAL_TABLET | Freq: Every day | ORAL | Status: DC

## 2019-08-02 MED ORDER — EPTIFIBATIDE 20 MG/10ML IV SOLN
INTRAVENOUS | Status: AC
  Filled 2019-08-02: qty 10

## 2019-08-02 MED ORDER — ROCURONIUM BROMIDE 100 MG/10ML IV SOLN
INTRAVENOUS | Status: DC | PRN
  Administered 2019-08-02: 50 mg via INTRAVENOUS

## 2019-08-02 MED ORDER — SODIUM CHLORIDE (PF) 0.9 % IJ SOLN
INTRAMUSCULAR | Status: AC
  Filled 2019-08-02: qty 50

## 2019-08-02 MED ORDER — ATORVASTATIN CALCIUM 40 MG PO TABS
40.0000 mg | ORAL_TABLET | Freq: Every day | ORAL | Status: DC
  Administered 2019-08-02 – 2019-08-06 (×5): 40 mg via ORAL
  Filled 2019-08-02 (×5): qty 1

## 2019-08-02 MED ORDER — VERAPAMIL HCL 2.5 MG/ML IV SOLN
INTRAVENOUS | Status: AC
  Filled 2019-08-02: qty 2

## 2019-08-02 MED ORDER — METOPROLOL TARTRATE 50 MG PO TABS
50.0000 mg | ORAL_TABLET | Freq: Two times a day (BID) | ORAL | Status: DC
  Administered 2019-08-02: 50 mg via ORAL
  Filled 2019-08-02: qty 1

## 2019-08-02 MED ORDER — CLEVIDIPINE BUTYRATE 0.5 MG/ML IV EMUL
INTRAVENOUS | Status: DC | PRN
  Administered 2019-08-02: 1 mg/h via INTRAVENOUS

## 2019-08-02 MED ORDER — FENTANYL CITRATE (PF) 100 MCG/2ML IJ SOLN
INTRAMUSCULAR | Status: DC | PRN
  Administered 2019-08-02 (×2): 50 ug via INTRAVENOUS

## 2019-08-02 MED ORDER — SUCCINYLCHOLINE CHLORIDE 20 MG/ML IJ SOLN
INTRAMUSCULAR | Status: DC | PRN
Start: 2019-08-02 — End: 2019-08-02
  Administered 2019-08-02: 100 mg via INTRAVENOUS

## 2019-08-02 MED ORDER — PROPOFOL 10 MG/ML IV BOLUS
INTRAVENOUS | Status: DC | PRN
  Administered 2019-08-02: 80 mg via INTRAVENOUS

## 2019-08-02 MED ORDER — STROKE: EARLY STAGES OF RECOVERY BOOK
Freq: Once | Status: AC
  Filled 2019-08-02 (×2): qty 1

## 2019-08-02 MED ORDER — SUGAMMADEX SODIUM 200 MG/2ML IV SOLN
INTRAVENOUS | Status: DC | PRN
  Administered 2019-08-02: 200 mg via INTRAVENOUS

## 2019-08-02 MED ORDER — LIDOCAINE 2% (20 MG/ML) 5 ML SYRINGE
INTRAMUSCULAR | Status: DC | PRN
  Administered 2019-08-02: 40 mg via INTRAVENOUS

## 2019-08-02 MED ORDER — ONDANSETRON HCL 4 MG/2ML IJ SOLN
INTRAMUSCULAR | Status: DC | PRN
  Administered 2019-08-02: 4 mg via INTRAVENOUS

## 2019-08-02 MED ORDER — SENNOSIDES-DOCUSATE SODIUM 8.6-50 MG PO TABS
1.0000 | ORAL_TABLET | Freq: Every evening | ORAL | Status: DC | PRN
  Administered 2019-08-04: 1 via ORAL

## 2019-08-02 MED ORDER — VERAPAMIL HCL 80 MG PO TABS
160.0000 mg | ORAL_TABLET | Freq: Two times a day (BID) | ORAL | Status: DC
  Administered 2019-08-02 – 2019-08-07 (×10): 160 mg via ORAL
  Filled 2019-08-02 (×12): qty 2

## 2019-08-02 MED ORDER — CLEVIDIPINE BUTYRATE 0.5 MG/ML IV EMUL
0.0000 mg/h | INTRAVENOUS | Status: DC
  Administered 2019-08-03: 5 mg/h via INTRAVENOUS
  Administered 2019-08-03: 17 mg/h via INTRAVENOUS
  Administered 2019-08-03: 10 mg/h via INTRAVENOUS
  Administered 2019-08-03: 9 mg/h via INTRAVENOUS
  Administered 2019-08-03: 15 mg/h via INTRAVENOUS
  Administered 2019-08-03: 5 mg/h via INTRAVENOUS
  Administered 2019-08-03: 4 mg/h via INTRAVENOUS
  Administered 2019-08-03: 11 mg/h via INTRAVENOUS
  Filled 2019-08-02: qty 50
  Filled 2019-08-02 (×3): qty 100
  Filled 2019-08-02 (×2): qty 50

## 2019-08-02 MED ORDER — CHLORHEXIDINE GLUCONATE CLOTH 2 % EX PADS
6.0000 | MEDICATED_PAD | Freq: Every day | CUTANEOUS | Status: DC
  Administered 2019-08-02 – 2019-08-06 (×4): 6 via TOPICAL

## 2019-08-02 MED ORDER — CLOPIDOGREL BISULFATE 300 MG PO TABS
ORAL_TABLET | ORAL | Status: AC
  Filled 2019-08-02: qty 1

## 2019-08-02 MED ORDER — NITROGLYCERIN 1 MG/10 ML FOR IR/CATH LAB
INTRA_ARTERIAL | Status: AC
  Administered 2019-08-02: 300 ug via INTRA_ARTERIAL
  Administered 2019-08-02: 100 ug via INTRA_ARTERIAL
  Administered 2019-08-02: 500 ug via INTRA_ARTERIAL
  Filled 2019-08-02: qty 10

## 2019-08-02 MED ORDER — LISINOPRIL 20 MG PO TABS: 20.0000 mg | ORAL_TABLET | Freq: Every day | ORAL | Status: DC

## 2019-08-02 MED ORDER — IOHEXOL 240 MG/ML SOLN
150.0000 mL | Freq: Once | INTRAMUSCULAR | Status: AC | PRN
  Administered 2019-08-02: 45 mL via INTRA_ARTERIAL

## 2019-08-02 MED ORDER — CEFAZOLIN SODIUM-DEXTROSE 2-4 GM/100ML-% IV SOLN
INTRAVENOUS | Status: AC
  Filled 2019-08-02: qty 100

## 2019-08-02 MED ORDER — DEXAMETHASONE SODIUM PHOSPHATE 10 MG/ML IJ SOLN
INTRAMUSCULAR | Status: DC | PRN
Start: 2019-08-02 — End: 2019-08-02
  Administered 2019-08-02: 4 mg via INTRAVENOUS

## 2019-08-02 MED ORDER — LIDOCAINE HCL (PF) 1 % IJ SOLN
INTRAMUSCULAR | Status: AC
  Filled 2019-08-02: qty 5

## 2019-08-02 MED ORDER — CEFAZOLIN SODIUM-DEXTROSE 2-3 GM-%(50ML) IV SOLR
INTRAVENOUS | Status: DC | PRN
  Administered 2019-08-02: 2 g via INTRAVENOUS

## 2019-08-02 MED ORDER — SODIUM CHLORIDE 0.9 % IV SOLN: INTRAVENOUS | Status: AC

## 2019-08-02 MED ORDER — CLEVIDIPINE BUTYRATE 0.5 MG/ML IV EMUL
INTRAVENOUS | Status: AC
  Administered 2019-08-02: 13 mg/h via INTRAVENOUS
  Filled 2019-08-02: qty 50

## 2019-08-02 MED ORDER — TICAGRELOR 90 MG PO TABS
ORAL_TABLET | ORAL | Status: AC
  Filled 2019-08-02: qty 2

## 2019-08-02 NOTE — ED Provider Notes (Signed)
[facility] DEPT Provider Note   CSN: [id number] Arrival date & time: 08/02/19  1543     History Chief Complaint  Patient presents with  . Code Stroke     Jodi [name] is a 86 y.o. female. I was not informed of the patient's arrival, and realized at 4:15 PM that she needed a physician evaluation.  As I entered the room she was being evaluated by the Neuro hospitalist by video conference.  At this time the patient is sitting, listening, and in no apparent distress.  Her daughter is talking to the consultant on the monitor. HPI Patient came by private vehicle for evaluation of possible stroke.  Daughter reports patient has been confused.  Earlier today the patient had gone to [place] to see her PCP and had an uneventful visit there.  After returning to [place], they went shopping at [facility] and decided to take the patient to her home.  While at home, the daughter was preparing the patient to take her evening medicines when she noticed that the patient was confused.  Therefore she decided to bring the patient to the ED.  There have been no other illnesses in the last few days.  The patient lives alone and the daughter checks on her frequently.  Patient saw her PCP this morning at 10:30 AM, for a checkup.  At that time she noticed that she had had persistent dizziness since her stroke, but that is improving.  It is noted the patient uses a walker for ambulation aid.  She was recently started on hydralazine for hypertension, apparently at the time of the stroke.    History reviewed. No pertinent past medical history.  Patient Active Problem List   Diagnosis Date Noted  . Stroke ([place]) 08/02/2019       OB History   No obstetric history on file.     No family history on file.  Social History   Tobacco Use  . Smoking status: Not on file  Substance Use Topics  . Alcohol use: Not on file  . Drug use: Not on file    Home Medications Prior to  Admission medications   Medication Sig Start Date End Date Taking? Authorizing Provider  albuterol (VENTOLIN HFA) 108 (90 Base) MCG/ACT inhaler Inhale 2 puffs into the lungs every 4 (four) hours as needed for wheezing or shortness of breath.  07/13/19  Yes [provider]  atorvastatin (LIPITOR) 40 MG tablet Take 40 mg by mouth at bedtime. 07/27/19  Yes [provider]  Cholecalciferol (VITAMIN D) 50 MCG (2000 UT) CAPS Take 1 capsule by mouth daily.   Yes [provider]  clopidogrel (PLAVIX) 75 MG tablet Take 75 mg by mouth daily. 07/27/19  Yes [provider]  furosemide (LASIX) 20 MG tablet Take 20 mg by mouth daily. 07/04/19  Yes [provider]  hydrALAZINE (APRESOLINE) 25 MG tablet Take 25 mg by mouth 2 (two) times daily. 06/18/19  Yes [provider]  lisinopril (ZESTRIL) 20 MG tablet Take 20 mg by mouth daily. 07/13/19  Yes [provider]  meclizine (ANTIVERT) 12.5 MG tablet Take 12.5 mg by mouth 3 (three) times daily as needed for dizziness.  07/27/19  Yes [provider]  metoprolol tartrate (LOPRESSOR) 50 MG tablet Take 50 mg by mouth 2 (two) times daily. 07/04/19  Yes [provider]  senna (SENOKOT) 8.6 MG tablet Take 1 tablet by mouth daily as needed for constipation.   Yes [provider]  triamcinolone (KENALOG) 0.1 % paste Use as directed 1 application in the mouth or throat 2 (two) times daily.   Yes [provider]  verapamil (CALAN) 80 MG tablet Take 160 mg by mouth 2 (two) times daily.  08/02/19  Yes [provider]    Allergies    Aciphex [rabeprazole], Aspirin, Hydralazine, and Morphine and related  Review of Systems   Review of Systems  All other systems reviewed and are negative.   Physical Exam Updated Vital Signs BP (!) 174/77 (BP Location: Right Arm)   Pulse 63   Temp 98.4 F (36.9 C) (Oral)   Resp 18   Wt 73.4 kg   SpO2 97%   Physical Exam Vitals and  nursing note reviewed.  Constitutional:      General: She is not in acute distress.    Appearance: She is well-developed. She is not ill-appearing, toxic-appearing or diaphoretic.  HENT:     Head: Normocephalic and atraumatic.     Mouth/Throat:     Mouth: Mucous membranes are moist.     Pharynx: No oropharyngeal exudate.  Eyes:     Conjunctiva/sclera: Conjunctivae normal.     Pupils: Pupils are equal, round, and reactive to light.  Neck:     Trachea: Phonation normal.  Cardiovascular:     Rate and Rhythm: Normal rate and regular rhythm.  Pulmonary:     Effort: Pulmonary effort is normal.     Breath sounds: Normal breath sounds.  Chest:     Chest wall: No tenderness.  Abdominal:     General: There is no distension.     Palpations: Abdomen is soft.     Tenderness: There is no abdominal tenderness. There is no guarding.  Musculoskeletal:        General: Normal range of motion.     Cervical back: Normal range of motion and neck supple.  Skin:    General: Skin is warm and dry.  Neurological:     Mental Status: She is alert.     Motor: No abnormal muscle tone.     Comments: No dysarthria or aphasia.  Possible left-sided neglect which is mild and correctable.  External ocular muscles move laterally, bilaterally, normally.  She is unable to specify what she ate today or what her activities were prior to coming here.  She is conversant.  Psychiatric:        Mood and Affect: Mood normal.        Behavior: Behavior normal.     Comments: Alert and responsive.  She is confused.     ED Results / Procedures / Treatments   Labs (all labs ordered are listed, but only abnormal results are displayed) Labs Reviewed  COMPREHENSIVE METABOLIC PANEL - Abnormal; Notable for the following components:      Result Value   Glucose, Bld 126 (*)    Creatinine, Ser 1.20 (*)    GFR calc non Af Amer 41 (*)    GFR calc Af Amer 48 (*)    All other components within normal limits  URINALYSIS, ROUTINE W  REFLEX MICROSCOPIC - Abnormal; Notable for the following components:   Color, Urine STRAW (*)    All other components within normal limits  CBG MONITORING, ED - Abnormal; Notable for the following components:   Glucose-Capillary 125 (*)    All other components within normal limits  I-STAT CHEM 8, ED - Abnormal; Notable for the following components:   Creatinine, Ser 1.20 (*)    Glucose,  Bld 120 (*)    All other components within normal limits  SARS CORONAVIRUS 2 BY RT PCR [facility] ORDER, [place] LAB)  PROTIME-INR  APTT  CBC  DIFFERENTIAL  CBG MONITORING, ED    EKG EKG Interpretation  Date/Time:  Thursday August 02 2019 15:58:06 EDT Ventricular Rate:  63 PR Interval:    QRS Duration: 91 QT Interval:  438 QTC Calculation: 449 R Axis:   -56 Text Interpretation: Sinus or ectopic atrial rhythm Prolonged PR interval Left anterior fascicular block Abnormal R-wave progression, early transition No old tracing to compare Confirmed by Daleen Bo [phone]) on 08/02/2019 4:26:23 PM   Radiology CT Angio Head W or Wo Contrast  Result Date: 08/02/2019 CLINICAL DATA:  Confusion.  Abnormal head CT. EXAM: CT ANGIOGRAPHY HEAD AND NECK TECHNIQUE: Multidetector CT imaging of the head and neck was performed using the standard protocol during bolus administration of intravenous contrast. Multiplanar CT image reconstructions and MIPs were obtained to evaluate the vascular anatomy. Carotid stenosis measurements (when applicable) are obtained utilizing NASCET criteria, using the distal internal carotid diameter as the denominator. CONTRAST:  119mL OMNIPAQUE IOHEXOL 350 MG/ML SOLN COMPARISON:  Head CT earlier same day FINDINGS: CTA NECK FINDINGS Aortic arch: Aortic atherosclerosis. No aneurysm or dissection. Branching pattern is normal without origin stenosis. Right carotid system: Common carotid artery widely patent to the bifurcation. Carotid bifurcation is normal without  atherosclerotic disease or stenosis. Cervical ICA is widely patent. Left carotid system: Common carotid artery widely patent to the bifurcation. Carotid bifurcation is normal. Cervical ICA is normal. Vertebral arteries: Both vertebral artery origins are widely patent. The left is dominant. Both vertebral arteries are patent through the cervical region to the foramen magnum. Skeleton: Ordinary cervical spondylosis. Other neck: No mass or lymphadenopathy. Upper chest: Normal Review of the MIP images confirms the above findings CTA HEAD FINDINGS Anterior circulation: Both internal carotid arteries widely patent through the skull base and siphon regions. The anterior and middle cerebral vessels are patent proximally. There is occlusion of a proximal right M2 branch vessel. No other anterior circulation finding. No aneurysm or vascular malformation. Posterior circulation: Both vertebral arteries are patent through the foramen magnum to the basilar. Patent posterior communicating arteries on each side. No basilar stenosis. Superior cerebellar and posterior cerebral arteries show flow. Primary anterior circulation supply of the posterior cerebral arteries. Venous sinuses: Patent and normal. Anatomic variants: None significant. Review of the MIP images confirms the above findings IMPRESSION: Occlusion of a right proximal M2 branch. No other abnormal anterior circulation finding. Posterior circulation intracranial branch vessels are intact. No carotid bifurcation disease.  No vertebral artery origin disease. Minimal aortic atherosclerosis. These results were called by telephone at the time of interpretation on 08/02/2019 at 6:24 pm to Dr. Marcy Salvo verbally acknowledged these results. Electronically Signed   By: Nelson Chimes M.D.   On: 08/02/2019 18:28   CT Angio Neck W and/or Wo Contrast  Result Date: 08/02/2019 CLINICAL DATA:  Confusion.  Abnormal head CT. EXAM: CT ANGIOGRAPHY HEAD AND NECK TECHNIQUE: Multidetector CT  imaging of the head and neck was performed using the standard protocol during bolus administration of intravenous contrast. Multiplanar CT image reconstructions and MIPs were obtained to evaluate the vascular anatomy. Carotid stenosis measurements (when applicable) are obtained utilizing NASCET criteria, using the distal internal carotid diameter as the denominator. CONTRAST:  128mL OMNIPAQUE IOHEXOL 350 MG/ML SOLN COMPARISON:  Head CT earlier same day FINDINGS: CTA NECK FINDINGS Aortic arch: Aortic atherosclerosis.  No aneurysm or dissection. Branching pattern is normal without origin stenosis. Right carotid system: Common carotid artery widely patent to the bifurcation. Carotid bifurcation is normal without atherosclerotic disease or stenosis. Cervical ICA is widely patent. Left carotid system: Common carotid artery widely patent to the bifurcation. Carotid bifurcation is normal. Cervical ICA is normal. Vertebral arteries: Both vertebral artery origins are widely patent. The left is dominant. Both vertebral arteries are patent through the cervical region to the foramen magnum. Skeleton: Ordinary cervical spondylosis. Other neck: No mass or lymphadenopathy. Upper chest: Normal Review of the MIP images confirms the above findings CTA HEAD FINDINGS Anterior circulation: Both internal carotid arteries widely patent through the skull base and siphon regions. The anterior and middle cerebral vessels are patent proximally. There is occlusion of a proximal right M2 branch vessel. No other anterior circulation finding. No aneurysm or vascular malformation. Posterior circulation: Both vertebral arteries are patent through the foramen magnum to the basilar. Patent posterior communicating arteries on each side. No basilar stenosis. Superior cerebellar and posterior cerebral arteries show flow. Primary anterior circulation supply of the posterior cerebral arteries. Venous sinuses: Patent and normal. Anatomic variants: None  significant. Review of the MIP images confirms the above findings IMPRESSION: Occlusion of a right proximal M2 branch. No other abnormal anterior circulation finding. Posterior circulation intracranial branch vessels are intact. No carotid bifurcation disease.  No vertebral artery origin disease. Minimal aortic atherosclerosis. These results were called by telephone at the time of interpretation on 08/02/2019 at 6:24 pm to Dr. Marcy Salvo verbally acknowledged these results. Electronically Signed   By: Nelson Chimes M.D.   On: 08/02/2019 18:28   CT HEAD CODE STROKE WO CONTRAST  Result Date: 08/02/2019 CLINICAL DATA:  Code stroke. Worsening confusion. Recent stroke treated at outside institution. EXAM: CT HEAD WITHOUT CONTRAST TECHNIQUE: Contiguous axial images were obtained from the base of the skull through the vertex without intravenous contrast. COMPARISON:  None. FINDINGS: Brain: Age related volume loss. Chronic small-vessel ischemic changes of the cerebral hemispheric white matter. Probable low-density in the left cerebellum that could be an acute or subacute infarction. No sign of hemorrhage, mass, hydrocephalus or extra-axial collection. Vascular: There is atherosclerotic calcification of the major vessels at the base of the brain. Skull: Negative Sinuses/Orbits: Clear/normal Other: None ASPECTS ([place] Stroke Program Early CT Score) - Ganglionic level infarction (caudate, lentiform nuclei, internal capsule, insula, M1-M3 cortex): 7 - Supraganglionic infarction (M4-M6 cortex): 3 Total score (0-10 with 10 being normal): 10 IMPRESSION: 1. Chronic small-vessel ischemic changes of the cerebral hemispheric white matter. Probable acute or subacute infarction in the left cerebellar hemisphere. No mass effect or hemorrhage. 2. These results were called by telephone at the time of interpretation on 08/02/2019 at 4:17 pm to provider Dr. Melina Copa, who verbally acknowledged these results. 3. ASPECTS is Electronically Signed    By: Nelson Chimes M.D.   On: 08/02/2019 16:19    Procedures .Critical Care Performed by: Daleen Bo, MD Authorized by: Daleen Bo, MD   Critical care provider statement:    Critical care time (minutes):  75   Critical care start time:  08/02/2019 4:00 PM   Critical care end time:  08/02/2019 7:49 PM   Critical care time was exclusive of:  Separately billable procedures and treating other patients   Critical care was necessary to treat or prevent imminent or life-threatening deterioration of the following conditions:  CNS failure or compromise   Critical care was time spent personally by me on the following  activities:  Blood draw for specimens, development of treatment plan with patient or surrogate, discussions with consultants, evaluation of patient's response to treatment, examination of patient, obtaining history from patient or surrogate, ordering and performing treatments and interventions, ordering and review of laboratory studies, pulse oximetry, re-evaluation of patient's condition, review of old charts and ordering and review of radiographic studies   (including critical care time)  Medications Ordered in ED Medications  sodium chloride flush (NS) 0.9 % injection 3 mL (has no administration in time range)  sodium chloride (PF) 0.9 % injection (has no administration in time range)  iohexol (OMNIPAQUE) 350 MG/ML injection 100 mL (100 mLs Intravenous Contrast Given 08/02/19 1759)    ED Course  I have reviewed the triage vital signs and the nursing notes.  Pertinent labs & imaging results that were available during my care of the patient were reviewed by me and considered in my medical decision making (see chart for details).  Clinical Course as of Aug 02 1947  Thu Aug 02, 2019  1621 Report from radiology, no clear acute stroke.  There is a left cerebellar abnormality consistent with her prior stroke, diagnosed on 12/22/2019.   [EW]  1610 After evaluation by teleneurology,  the consultant ordered a CT angiogram looking for large vessel occlusion.   [EW]  1657 Normal  Protime-INR [EW]  1839 Normal  CBC [EW]  9604 Normal except creatinine high, glucose high  I-stat chem 8, ED(!) [EW]  1839 Elevated  CBG monitoring, ED(!) [EW]  1839 Normal  APTT [EW]  1839 Normal  Differential [EW]  5409 Normal except glucose high, creatinine high, GFR low  Comprehensive metabolic panel(!) [EW]  8119 Per radiologist interpretation, acute occlusion of right M2 branch.  CT Angio Head W or Wo Contrast [EW]  1840 I had multiple conversations with our neuro hospitalist, Dr. Rory Percy.  He has arranged for intra-arterial thrombectomy, with the interventional radiologist.  Patient's daughter is agreeable and is currently giving consent by telephone.  Plan emergent transfer via [facility].   [EW]    Clinical Course User Index [EW] Daleen Bo, MD   MDM Rules/Calculators/A&P                           Patient Vitals for the past 24 hrs:  BP Temp Temp src Pulse Resp SpO2 Weight  08/02/19 1621 -- -- -- -- -- -- 73.4 kg  08/02/19 1602 (!) 174/77 98.4 F (36.9 C) Oral 63 18 97 % --    7:21 PM Reevaluation with update and discussion. After initial assessment and treatment, an updated evaluation reveals no change in clinical status.  Patient's daughter updated on findings and plan.Daleen Bo   Medical Decision Making:  This patient is presenting for evaluation of confusion with neglect, which high require a range of treatment options, and is a complaint that involves a high risk of morbidity and mortality. The differential diagnoses include acute CVA, encephalopathy, residual effects from prior stroke. I decided to review old records, and in summary elderly patient who had a recent cerebellar stroke, presenting now with no symptoms of confusion and neglect.  I obtain additional historical information from her daughter at the bedside.  Clinical Laboratory Tests Ordered,  included CBC, Metabolic panel, Urinalysis and Coagulation parameters, Covid testing. Review indicates initial labs reassuring, with exception of mild renal insufficiency.  Creatinine 1.2. Radiologic Tests Ordered, included CT head without contrast no acute stroke, visualized prior left cerebellar stroke.  CT angiogram neck and head indicates right M2 occlusion likely acute.  I independently Visualized: CT images images, which show findings as above  Cardiac Monitor Tracing which shows normal sinus rhythm    Critical Interventions-clinical evaluation, laboratory testing, coordination consultation with telemetry neuro hospitalist.  Observation.  Discussion with local neuro hospitalist for arrangement of intervention/thrombectomy.  After These Interventions, the Patient was reevaluated and was found with acute intracranial arterial occlusion, causing confusion and neglect, requiring thrombectomy  CRITICAL CARE-yes Performed by: Daleen Bo  Nursing Notes Reviewed/ Care Coordinated Applicable Imaging Reviewed Interpretation of Laboratory Data incorporated into ED treatment  Plan-transfer to [facility] interventional radiology suite.    Final Clinical Impression(s) / ED Diagnoses Final diagnoses:  Cerebral infarction due to other mechanism [facility])    Rx / DC Orders ED Discharge Orders    None       Daleen Bo, MD 08/02/19 2309

## 2019-08-02 NOTE — Consult Note (Addendum)
[facility] Tele[facility]urology Consult Services   Date of Service:   08/02/2019 16:08:09  Impression:     .  I63.00 - Cerebrovascular accident (CVA) due to thrombosis of precerebral artery ([place])  Comments/Sign-Out: On clinical examination patient has some confusion and possible aphasia. Her exam is limited because of her mental status changes. She is not a candidate for alteplase because of recent stroke last week. Plain CT is negative. We will do a CTA to make sure she does not have any evidence of large vessel occlusion. If the CTA, CTP are negative, she would need admission for MRI of the head and metabolic work-up along with neurology follow-up.  Metrics: Last Known Well: 08/02/2019 15:00:00 [facility] Notification Time: 08/02/2019 16:08:08 Arrival Time: 08/02/2019 15:43:00 Stamp Time: 08/02/2019 16:08:09 Time First Login Attempt: 08/02/2019 16:12:28 Symptoms: confusion NIHSS Start Assessment Time: 08/02/2019 16:20:13 Patient is not a candidate for Alteplase/Activase. Alteplase Medical Decision: 08/02/2019 16:28:52 Patient was not deemed candidate for Alteplase/Activase thrombolytics because of following reasons: Stroke in last 3 months.  CT head showed no acute hemorrhage or acute core infarct.  ED Physician notified of diagnostic impression and management plan on 08/02/2019 16:33:16  Advanced Imaging: CTA Head and Neck Completed.  LVO:No, will update the note after official report of CTA comes back, if abnormal   Our recommendations are outlined below.  Recommendations:     .  Activate Stroke Protocol Admission/Order Set     .  Stroke/Telemetry Floor     .  Neuro Checks     .  Bedside Swallow Eval     .  DVT Prophylaxis     .  IV Fluids, Normal Saline     .  Head of Bed 30 Degrees     .  Euglycemia and Avoid Hyperthermia (PRN Acetaminophen)     .  Antiplatelet Therapy Recommended  Routine Consultation with [facility] for Follow up  Care  Sign Out:     .  Discussed with Emergency Department Provider    ------------------------------------------------------------------------------  History of Present Illness: Patient is a 86 year old Female.  Patient was brought by EMS for symptoms of confusion  86 year old female with history of a recent stroke, came to the hospital because of some confusion. She had a stroke last week and was admitted to [facility]. She was discharged last week. The stroke was cerebellar. She was put on Plavix and Lipitor after the stroke. She was with her daughter this afternoon when she suddenly developed some confusion and disorientation and was brought to the hospital. Patient is not sure exactly what is going on   Past Medical History:     . Hypertension     . Hyperlipidemia     . Coronary Artery Disease     . Stroke     . There is NO history of Diabetes Mellitus     . There is NO history of Atrial Fibrillation  Anticoagulant use:  No  Antiplatelet use: Plavix    Examination: BP(174/77), Pulse(62), Blood Glucose(n/a) 1A: Level of Consciousness - Alert; keenly responsive + 0 1B: Ask Month and Age - 1 Question Right + 1 1C: Blink Eyes & Squeeze Hands - Performs Both Tasks + 0 2: Test Horizontal Extraocular Movements - Normal + 0 3: Test Visual Fields - Partial Hemianopia + 1 4: Test Facial Palsy (Use Grimace if Obtunded) - Normal symmetry + 0 5A: Test Left Arm Motor Drift - No Drift for 10 Seconds + 0 5B: Test  Right Arm Motor Drift - No Drift for 10 Seconds + 0 6A: Test Left Leg Motor Drift - Drift, but doesn't hit bed + 1 6B: Test Right Leg Motor Drift - No Drift for 5 Seconds + 0 7: Test Limb Ataxia (FNF/Heel-Shin) - No Ataxia + 0 8: Test Sensation - Normal; No sensory loss + 0 9: Test Language/Aphasia - Mild-Moderate Aphasia: Some Obvious Changes, Without Significant Limitation + 1 10: Test Dysarthria - Normal + 0 11: Test Extinction/Inattention -  Visual/tactile/auditory/spatial/personal inattention + 1  NIHSS Score: 5  Pre-Morbid Modified Ranking Scale: 2 Points = Slight disability; unable to carry out all previous activities, but able to look after own affairs without assistance   Patient/Family was informed the Neurology Consult would occur via TeleHealth consult by way of interactive audio and video telecommunications and consented to receiving care in this manner.   Patient is being evaluated for possible acute neurologic impairment and high probability of imminent or life-threatening deterioration. I spent total of 40 minutes providing care to this patient, including time for face to face visit via telemedicine, review of medical records, imaging studies and discussion of findings with providers, the patient and/or family.   Dr Faustino Congress   [facility] [phone]  Case [id number]  Addendum: CTA showed a right M2 occlusion. ED was called by Radiology and they consulted Dr Rory Percy. Patient has been accepted for thrombectomy and transferred for this via [facility].

## 2019-08-02 NOTE — Plan of Care (Addendum)
Phone call from [facility] re: a code stroke at [facility] by Dr. Eulis Foster at [facility] [place] regarding this patient. Presented as code stroke, seen by telemedicine neurology for confusion and possible aphasia. Limited exam due to mental status according to the note. NIHSS 5 Recommended CTA head and neck which was done later after the consultation was completed. CT angio head and neck shows a right M2 occlusion. Dr. Eulis Foster called me for further recommendations. I spoke with the patient's daughter-Ms. Mohammed over the phone and discussed the option of thrombectomy. Patient's modified Rankin score-2 Recent cerebellar stroke on June 6 with a discharged from [facility] on June 11 precluded TPA by telemedicine neurology. Daughter agreed to thrombectomy. IR code stroke activated via [place]. Patient will be received by the on-call neurologist at the ER bridge and taken directly to thrombectomy. She is less than 6 hours from last seen normal hence CT perfusion not recommended. Aspects 10. Dr. Ladean Raya aware and agreed to take thepatient and activate IR code. Dr. Cheral Marker, oncoming neurohospitalist also aware  Daughter-Jameela Mora Appl [phone]  Risks and benefits including that of bleed explained to the daughter and she agreed verbally to the procedure. Consent obtained over phone from daughter prior to pt arrival to [facility] with Charge RN Copeland witnessing.  -- Amie Portland, MD [facility] Pager: [phone] If 7pm to 7am, please call on call as listed on AMION.

## 2019-08-02 NOTE — ED Notes (Signed)
Pt transported to CT ?

## 2019-08-02 NOTE — ED Triage Notes (Signed)
Patient reports to the ED with complaint of stroke like symptoms. Patient reportedly was acting normal all day with her daughter and then became confused and using inappropriate words and not acting appropriately.

## 2019-08-02 NOTE — Transfer of Care (Signed)
Immediate Anesthesia Transfer of Care Note  Patient: Jodi [name]  Procedure(s) Performed: IR WITH ANESTHESIA (N/A )  Patient Location: ICU  Anesthesia Type:General  Level of Consciousness: awake, alert  and patient cooperative  Airway & Oxygen Therapy: Patient Spontanous Breathing and Patient connected to face mask oxygen  Post-op Assessment: Report given to RN and Post -op Vital signs reviewed and stable  Post vital signs: Reviewed and stable  Last Vitals:  Vitals Value Taken Time  BP 153/77 08/02/19 2134  Temp    Pulse 87 08/02/19 2145  Resp 16 08/02/19 2145  SpO2 100 % 08/02/19 2145  Vitals shown include unvalidated device data.  Last Pain:  Vitals:   08/02/19 1618  TempSrc:   PainSc: 0-No pain         Complications: No complications documented.

## 2019-08-02 NOTE — Procedures (Signed)
INTERVENTIONAL NEURORADIOLOGY BRIEF POSTPROCEDURE NOTE  DIAGNOSTIC CEREBRAL ANGIOGRAM AND MECHANICAL THROMBECTOMY  Attending: Dr. Pedro Earls  Assistant: None  Diagnosis: Rigth M2/MCA occlusion  Access site: Right radial artery  Access closure: Inflatable band  Anesthesia: General  Medication used: refer to anesthesia documentation for sedation medications.  Complications: None  Estimated blood loss: 30 mL  Specimen: None  Findings: Mid right M2/MCA posterior division branch occlusion. Mechanical thrombectomy performed with direct contact aspiration. Total one pass, TICI3. No embolus to new territory. No SAH on post procedural flat panel CT. Hyperdensity in the left cerebellar hemisphere at the site of prior stroke, may be related to contrast staining.   The patient tolerated the procedure well without incident or complication and is in stable condition.

## 2019-08-02 NOTE — H&P (Addendum)
Admission H&P    Chief Complaint: Acute onset of confusion and dysphasia  HPI: Jodi [name] is an 86 y.o. female with a recent cerebellar stroke on June 6, discharged from [facility] on June 11, started on Plavix and Lipitor, who presented as code stroke to the [facility] ED this afternoon. She had reportedly been acting normal all day with her daughter and then became confused, using inappropriate words and not acting normally. After arrival to the [facility] ED, she was seen by Teleneurology for confusion and possible aphasia. Exam was limited due to mental status according to the note. Her NIHSS was 5. CT head showed no acute hypodensity or hemorrhage. She was not a candidate for tPA due to her recent stroke. Teleneurology recommended CTA head and neck which revealed an acute right M2 occlusion. Dr. Rory Percy spoke with the patient's daughter, Ms. Mohammed, over the phone and discussed the option of thrombectomy. It was noted that the patient's modified Rankin score is 2. Daughter agreed to thrombectomy after risks/benefits were discussed. She was emergently transported to the [facility] ED to be signed in and then emergently transported to [place].  Per her daughter, the patient has no metal implants, pacemaker or other implantable electronic devices.    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 Aciphex [Rabeprazole]     Hives  . Aspirin     Anaphylaxis  . Hydralazine     Nausea  . Morphine And Related     Emesis    Medications Prior to Admission  Medication Sig Dispense Refill  . albuterol (VENTOLIN HFA) 108 (90 Base) MCG/ACT inhaler  Inhale 2 puffs into the lungs every 4 (four) hours as needed for wheezing or shortness of breath.     Marland Kitchen atorvastatin (LIPITOR) 40 MG tablet Take 40 mg by mouth at bedtime.    . Cholecalciferol (VITAMIN D) 50 MCG (2000 UT) CAPS Take 1 capsule by mouth daily.    . clopidogrel (PLAVIX) 75 MG tablet Take 75 mg by mouth daily.    . furosemide (LASIX) 20 MG tablet Take 20 mg by mouth daily.    . hydrALAZINE (APRESOLINE) 25 MG tablet Take 25 mg by mouth 2 (two) times daily.    Marland Kitchen lisinopril (ZESTRIL) 20 MG tablet Take 20 mg by mouth daily.    . meclizine (ANTIVERT) 12.5 MG tablet Take 12.5 mg by mouth 3 (three) times daily as needed for dizziness.     . metoprolol tartrate (LOPRESSOR) 50 MG tablet Take 50 mg by mouth 2 (two) times daily.    Marland Kitchen senna (SENOKOT) 8.6 MG tablet Take 1 tablet by mouth daily as needed for constipation.    . triamcinolone (KENALOG) 0.1 % paste Use as directed 1 application in the mouth or throat 2 (two) times daily.    . verapamil (CALAN) 80 MG tablet Take 160 mg by mouth 2 (two) times daily.       ROS: Deferred in the context of acuity of presentation and anosognosia.   Physical Examination: Blood pressure (!) 174/77, pulse 63, temperature 98.4 F (36.9 C), temperature source Oral, resp. rate 18, weight 73.4 kg, SpO2 97 %.  HEENT-  [place]/AT  Lungs - Respirations unlabored Extremities - Warm and well-perfused  Neurologic Examination: Mental Status: Awake with mildly decreased level of alertness. Anosognosia with mild left sided neglect. Speech non-dysarthric and fluent. Able to follow simple commands. Has difficulty with more complex questions. Oriented to self, and partially oriented to geographical location and time, but not to the current situation and does not know that she is in the hospital, nor that she is speaking with a doctor.  Cranial Nerves: II:  Left visual field cut. PERRL.  III,IV, VI: No ptosis. EOM are full, but has rightward gaze preference and difficulty  crossing the midline to left while tracking.  V,VII: No facial droop. Temp sensation equal bilaterally.  VIII: hearing intact to voice IX,X: No hypophonia XI: Head preferentially rotated to the right XII: No lingual dysarthria Motor: 5/5 RUE 4+/5 LUE with decreased spontaneous movement Withdraws BLE to plantar stimulation.  Sensory: Temp intact BUE. Reacts to plantar stimulation BLE Deep Tendon Reflexes:  Grossly unremarkable Plantars: Right: downgoing   Left: downgoing Cerebellar: No gross ataxia noted Gait: Unable to assess  Results for orders placed or performed during the hospital encounter of 08/02/19 (from the past 48 hour(s))  CBG monitoring, ED     Status: Abnormal   Collection Time: 08/02/19  3:57 PM  Result Value Ref Range   Glucose-Capillary 125 (H) 70 - 99 mg/dL    Comment: Glucose reference range applies only to samples taken after fasting for at least 8 hours.   Comment 1 Notify RN   Comprehensive metabolic panel     Status: Abnormal   Collection Time: 08/02/19  4:02 PM  Result Value Ref Range   Sodium 141 135 - 145 mmol/L   Potassium 4.2 3.5 - 5.1 mmol/L   Chloride 104 98 - 111 mmol/L   CO2 26 22 - 32 mmol/L   Glucose, Bld 126 (H) 70 - 99 mg/dL    Comment: Glucose reference range applies only to samples taken after fasting for at least 8 hours.   BUN 9 8 - 23 mg/dL   Creatinine, Ser 1.20 (H) 0.44 - 1.00 mg/dL   Calcium 9.8 8.9 - 10.3 mg/dL   Total Protein 7.4 6.5 - 8.1 g/dL   Albumin 4.1 3.5 - 5.0 g/dL   AST 28 15 - 41 U/L   ALT 18 0 - 44 U/L   Alkaline Phosphatase 85 38 - 126 U/L   Total Bilirubin 0.5 0.3 - 1.2 mg/dL   GFR calc non Af Amer 41 (L) >60 mL/min   GFR calc Af Amer 48 (L) >60 mL/min   Anion gap 11 5 - 15    Comment: Performed at [facility], [place] [address], [place], [place] [address]  Ginger Carne 8, ED     Status: Abnormal   Collection Time: 08/02/19  4:40 PM  Result Value Ref Range   Sodium 142 135 - 145 mmol/L    Potassium 3.8 3.5 - 5.1 mmol/L   Chloride 101 98 - 111 mmol/L   BUN 8 8 - 23 mg/dL   Creatinine, Ser 1.20 (H) 0.44 - 1.00 mg/dL   Glucose, Bld 120 (H) 70 - 99 mg/dL    Comment: Glucose reference range applies only to samples taken after fasting for at least 8 hours.   Calcium, Ion 1.18 1.15 - 1.40 mmol/L   TCO2 28 22 - 32 mmol/L   Hemoglobin 12.6 12.0 - 15.0 g/dL   HCT 37.0 36 - 46 %  Protime-INR  Status: None   Collection Time: 08/02/19  5:30 PM  Result Value Ref Range   Prothrombin Time 13.1 11.4 - 15.2 seconds   INR 1.0 0.8 - 1.2    Comment: (NOTE) INR goal varies based on device and disease states. Performed at [facility], [place] [address], [place], [place] [address]   APTT     Status: None   Collection Time: 08/02/19  5:30 PM  Result Value Ref Range   aPTT 30 24 - 36 seconds    Comment: Performed at [facility], [place] [address], [place], [place] [address]  CBC     Status: None   Collection Time: 08/02/19  5:30 PM  Result Value Ref Range   WBC 6.7 4.0 - 10.5 K/uL   RBC 4.30 3.87 - 5.11 MIL/uL   Hemoglobin 12.3 12.0 - 15.0 g/dL   HCT 38.7 36 - 46 %   MCV 90.0 80.0 - 100.0 fL   MCH 28.6 26.0 - 34.0 pg   MCHC 31.8 30.0 - 36.0 g/dL   RDW 14.0 11.5 - 15.5 %   Platelets 249 150 - 400 K/uL   nRBC 0.0 0.0 - 0.2 %    Comment: Performed at [facility], [place] [address], [place], [place] [address]  Differential     Status: None   Collection Time: 08/02/19  5:30 PM  Result Value Ref Range   Neutrophils Relative % 65 %   Neutro Abs 4.4 1.7 - 7.7 K/uL   Lymphocytes Relative 22 %   Lymphs Abs 1.5 0.7 - 4.0 K/uL   Monocytes Relative 11 %   Monocytes Absolute 0.7 0 - 1 K/uL   Eosinophils Relative 1 %   Eosinophils Absolute 0.1 0 - 0 K/uL   Basophils Relative 1 %   Basophils Absolute 0.1 0 - 0 K/uL   Immature Granulocytes 0 %   Abs Immature Granulocytes 0.02 0.00 - 0.07 K/uL    Comment: Performed at [facility], [place] [address], [place], [place] [address]  Urinalysis, Routine w reflex microscopic     Status: Abnormal   Collection Time: 08/02/19  6:18 PM  Result Value Ref Range   Color, Urine STRAW (A) YELLOW   APPearance CLEAR CLEAR   Specific Gravity, Urine 1.009 1.005 - 1.030   pH 8.0 5.0 - 8.0   Glucose, UA NEGATIVE NEGATIVE mg/dL   Hgb urine dipstick NEGATIVE NEGATIVE   Bilirubin Urine NEGATIVE NEGATIVE   Ketones, ur NEGATIVE NEGATIVE mg/dL   Protein, ur NEGATIVE NEGATIVE mg/dL   Nitrite NEGATIVE NEGATIVE   Leukocytes,Ua NEGATIVE NEGATIVE    Comment: Performed at [facility] [address] [place], [place] [address]  SARS Coronavirus 2 by RT PCR (hospital order, performed in [facility] hospital lab) Nasopharyngeal Nasopharyngeal Swab     Status: None   Collection Time: 08/02/19  6:37 PM   Specimen: Nasopharyngeal Swab  Result Value Ref Range   SARS Coronavirus 2 NEGATIVE NEGATIVE    Comment: (NOTE) SARS-CoV-2 target nucleic acids are NOT DETECTED.  The SARS-CoV-2 RNA is generally detectable in upper and lower respiratory specimens during the acute phase of infection. The lowest concentration of SARS-CoV-2 viral copies this assay can detect is 250 copies / mL. A negative result does not preclude SARS-CoV-2 infection and should not be used as the sole basis for treatment or other patient management decisions.  A negative result may occur with improper specimen collection / handling,  submission of specimen other than nasopharyngeal swab, presence of viral mutation(s) within the areas targeted by this assay, and inadequate number of viral copies (<250 copies / mL). A negative result must be combined with clinical observations, patient history, and epidemiological information.  Fact Sheet for Patients:   StrictlyIdeas.no  Fact Sheet for Healthcare Providers: BankingDealers.co.za  This test is not  yet approved or  cleared by the [place] FDA and has been authorized for detection and/or diagnosis of SARS-CoV-2 by FDA under an Emergency Use Authorization (EUA).  This EUA will remain in effect (meaning this test can be used) for the duration of the COVID-19 declaration under Section 564(b)(1) of the Act, 21 U.S.C. section 360bbb-3(b)(1), unless the authorization is terminated or revoked sooner.  Performed at [facility], [place] [address], [place], [place] [address]    CT Angio Head W or [place] Contrast  Result Date: 08/02/2019 CLINICAL DATA:  Confusion.  Abnormal head CT. EXAM: CT ANGIOGRAPHY HEAD AND NECK TECHNIQUE: Multidetector CT imaging of the head and neck was performed using the standard protocol during bolus administration of intravenous contrast. Multiplanar CT image reconstructions and MIPs were obtained to evaluate the vascular anatomy. Carotid stenosis measurements (when applicable) are obtained utilizing NASCET criteria, using the distal internal carotid diameter as the denominator. CONTRAST:  132mL OMNIPAQUE IOHEXOL 350 MG/ML SOLN COMPARISON:  Head CT earlier same day FINDINGS: CTA NECK FINDINGS Aortic arch: Aortic atherosclerosis. No aneurysm or dissection. Branching pattern is normal without origin stenosis. Right carotid system: Common carotid artery widely patent to the bifurcation. Carotid bifurcation is normal without atherosclerotic disease or stenosis. Cervical ICA is widely patent. Left carotid system: Common carotid artery widely patent to the bifurcation. Carotid bifurcation is normal. Cervical ICA is normal. Vertebral arteries: Both vertebral artery origins are widely patent. The left is dominant. Both vertebral arteries are patent through the cervical region to the foramen magnum. Skeleton: Ordinary cervical spondylosis. Other neck: No mass or lymphadenopathy. Upper chest: Normal Review of the MIP images confirms the above findings CTA HEAD FINDINGS  Anterior circulation: Both internal carotid arteries widely patent through the skull base and siphon regions. The anterior and middle cerebral vessels are patent proximally. There is occlusion of a proximal right M2 branch vessel. No other anterior circulation finding. No aneurysm or vascular malformation. Posterior circulation: Both vertebral arteries are patent through the foramen magnum to the basilar. Patent posterior communicating arteries on each side. No basilar stenosis. Superior cerebellar and posterior cerebral arteries show flow. Primary anterior circulation supply of the posterior cerebral arteries. Venous sinuses: Patent and normal. Anatomic variants: None significant. Review of the MIP images confirms the above findings IMPRESSION: Occlusion of a right proximal M2 branch. No other abnormal anterior circulation finding. Posterior circulation intracranial branch vessels are intact. No carotid bifurcation disease.  No vertebral artery origin disease. Minimal aortic atherosclerosis. These results were called by telephone at the time of interpretation on 08/02/2019 at 6:24 pm to Dr. Marcy Salvo verbally acknowledged these results. Electronically Signed   By: Nelson Chimes M.D.   On: 08/02/2019 18:28   CT Angio Neck W and/or Wo Contrast  Result Date: 08/02/2019 CLINICAL DATA:  Confusion.  Abnormal head CT. EXAM: CT ANGIOGRAPHY HEAD AND NECK TECHNIQUE: Multidetector CT imaging of the head and neck was performed using the standard protocol during bolus administration of intravenous contrast. Multiplanar CT image reconstructions and MIPs were obtained to evaluate the vascular anatomy. Carotid stenosis measurements (when applicable) are obtained utilizing NASCET criteria, using  the distal internal carotid diameter as the denominator. CONTRAST:  145mL OMNIPAQUE IOHEXOL 350 MG/ML SOLN COMPARISON:  Head CT earlier same day FINDINGS: CTA NECK FINDINGS Aortic arch: Aortic atherosclerosis. No aneurysm or dissection.  Branching pattern is normal without origin stenosis. Right carotid system: Common carotid artery widely patent to the bifurcation. Carotid bifurcation is normal without atherosclerotic disease or stenosis. Cervical ICA is widely patent. Left carotid system: Common carotid artery widely patent to the bifurcation. Carotid bifurcation is normal. Cervical ICA is normal. Vertebral arteries: Both vertebral artery origins are widely patent. The left is dominant. Both vertebral arteries are patent through the cervical region to the foramen magnum. Skeleton: Ordinary cervical spondylosis. Other neck: No mass or lymphadenopathy. Upper chest: Normal Review of the MIP images confirms the above findings CTA HEAD FINDINGS Anterior circulation: Both internal carotid arteries widely patent through the skull base and siphon regions. The anterior and middle cerebral vessels are patent proximally. There is occlusion of a proximal right M2 branch vessel. No other anterior circulation finding. No aneurysm or vascular malformation. Posterior circulation: Both vertebral arteries are patent through the foramen magnum to the basilar. Patent posterior communicating arteries on each side. No basilar stenosis. Superior cerebellar and posterior cerebral arteries show flow. Primary anterior circulation supply of the posterior cerebral arteries. Venous sinuses: Patent and normal. Anatomic variants: None significant. Review of the MIP images confirms the above findings IMPRESSION: Occlusion of a right proximal M2 branch. No other abnormal anterior circulation finding. Posterior circulation intracranial branch vessels are intact. No carotid bifurcation disease.  No vertebral artery origin disease. Minimal aortic atherosclerosis. These results were called by telephone at the time of interpretation on 08/02/2019 at 6:24 pm to Dr. Marcy Salvo verbally acknowledged these results. Electronically Signed   By: Nelson Chimes M.D.   On: 08/02/2019 18:28   CT  HEAD CODE STROKE WO CONTRAST  Result Date: 08/02/2019 CLINICAL DATA:  Code stroke. Worsening confusion. Recent stroke treated at outside institution. EXAM: CT HEAD WITHOUT CONTRAST TECHNIQUE: Contiguous axial images were obtained from the base of the skull through the vertex without intravenous contrast. COMPARISON:  None. FINDINGS: Brain: Age related volume loss. Chronic small-vessel ischemic changes of the cerebral hemispheric white matter. Probable low-density in the left cerebellum that could be an acute or subacute infarction. No sign of hemorrhage, mass, hydrocephalus or extra-axial collection. Vascular: There is atherosclerotic calcification of the major vessels at the base of the brain. Skull: Negative Sinuses/Orbits: Clear/normal Other: None ASPECTS ([place] Stroke Program Early CT Score) - Ganglionic level infarction (caudate, lentiform nuclei, internal capsule, insula, M1-M3 cortex): 7 - Supraganglionic infarction (M4-M6 cortex): 3 Total score (0-10 with 10 being normal): 10 IMPRESSION: 1. Chronic small-vessel ischemic changes of the cerebral hemispheric white matter. Probable acute or subacute infarction in the left cerebellar hemisphere. No mass effect or hemorrhage. 2. These results were called by telephone at the time of interpretation on 08/02/2019 at 4:17 pm to provider Dr. Melina Copa, who verbally acknowledged these results. 3. ASPECTS is Electronically Signed   By: Nelson Chimes M.D.   On: 08/02/2019 16:19    Assessment: 86 y.o. female presenting with acute right M2 occlusion  1. Exam reveals subtle left sided motor deficits, left visual field cut and left hemineglect.  2. CT head at [facility]: Chronic small-vessel ischemic changes of the cerebral hemispheric white matter. Probable acute or subacute infarction in the left cerebellar hemisphere. No mass effect or hemorrhage.  3. CTA of head and neck: Right M2 occlusion.  4.  VIR findings/procedure: Mid right M2/MCA posterior division branch occlusion.  Mechanical thrombectomy performed with direct contact aspiration. Total one pass, TICI3. No embolus to new territory. No SAH on post procedural flat panel CT. Hyperdensity in the left cerebellar hemisphere at the site of prior stroke, may be related to contrast staining.  5. Stroke Risk Factors - atrial fibrillation, HTN, recent stroke and history of cancer 6. Has an allergy to ASA.   Plan: 1. Admitting to the ICU under the Neurology service 2. BP goal: Per IR recommendations. 3. MRI Brain  4. Echocardiogram 5. No antiplatelet medications or anticoagulants post-IR. Can consider restarting Plavix or switching to an anticoagulant if repeat CT head 24 hours after thrombectomy is negative for hemorrhage 6. HgbA1c, fasting lipid panel 7. PT consult, OT consult, Speech consult 8. Telemetry monitoring 9. Frequent neuro checks 10. Stroke swallow screen   40 minutes spent in the emergent neurological evaluation and management of this critically ill patient. Time spent included coordination of care.   Electronically signed: Dr. Kerney Elbe 08/02/2019, 8:59 PM

## 2019-08-02 NOTE — Anesthesia Preprocedure Evaluation (Signed)
Anesthesia Evaluation  Patient identified by MRN, date of birth, ID band Patient awake    Reviewed: Allergy & Precautions, H&P , NPO status , Patient's Chart, lab work & pertinent test results, reviewed documented beta blocker date and time   Airway Mallampati: II  TM Distance: >3 FB Neck ROM: Full    Dental no notable dental hx. (+) Teeth Intact, Dental Advisory Given   Pulmonary neg pulmonary ROS,    Pulmonary exam normal breath sounds clear to auscultation       Cardiovascular hypertension, Pt. on medications and Pt. on home beta blockers  Rhythm:Regular Rate:Normal     Neuro/Psych CVA, Residual Symptoms negative psych ROS   GI/Hepatic negative GI ROS, Neg liver ROS,   Endo/Other  negative endocrine ROS  Renal/GU Renal InsufficiencyRenal disease  negative genitourinary   Musculoskeletal   Abdominal   Peds  Hematology negative hematology ROS (+)   Anesthesia Other Findings   Reproductive/Obstetrics negative OB ROS                             Anesthesia Physical Anesthesia Plan  ASA: III and emergent  Anesthesia Plan: General   Post-op Pain Management:    Induction: Intravenous, Rapid sequence and Cricoid pressure planned  PONV Risk Score and Plan: 3 and Treatment may vary due to age or medical condition  Airway Management Planned: Oral ETT  Additional Equipment:   Intra-op Plan:   Post-operative Plan: Extubation in OR and Possible Post-op intubation/ventilation  Informed Consent: I have reviewed the patients History and Physical, chart, labs and discussed the procedure including the risks, benefits and alternatives for the proposed anesthesia with the patient or authorized representative who has indicated his/her understanding and acceptance.     Dental advisory given  Plan Discussed with: CRNA  Anesthesia Plan Comments:         Anesthesia Quick Evaluation

## 2019-08-02 NOTE — Anesthesia Procedure Notes (Signed)
Procedure Name: Intubation Date/Time: 08/02/2019 8:07 PM Performed by: Clearnce Sorrel, CRNA Pre-anesthesia Checklist: Patient identified, Emergency Drugs available, Suction available, Patient being monitored and Timeout performed Patient Re-evaluated:Patient Re-evaluated prior to induction Oxygen Delivery Method: Ambu bag Preoxygenation: Pre-oxygenation with 100% oxygen Induction Type: IV induction, Rapid sequence and Cricoid Pressure applied Ventilation: Mask ventilation without difficulty Laryngoscope Size: Glidescope and 3 Grade View: Grade I Tube type: Subglottic suction tube Tube size: 7.0 mm Number of attempts: 1 Airway Equipment and Method: Stylet and Video-laryngoscopy Placement Confirmation: ETT inserted through vocal cords under direct vision,  breath sounds checked- equal and bilateral and CO2 detector Secured at: 22 cm Tube secured with: Tape Dental Injury: Teeth and Oropharynx as per pre-operative assessment

## 2019-08-02 NOTE — Anesthesia Postprocedure Evaluation (Signed)
Anesthesia Post Note  Patient: Jodi [name]  Procedure(s) Performed: IR WITH ANESTHESIA (N/A )     Patient location during evaluation: ICU Anesthesia Type: General Level of consciousness: awake and alert Pain management: pain level controlled Vital Signs Assessment: post-procedure vital signs reviewed and stable Respiratory status: spontaneous breathing, nonlabored ventilation and respiratory function stable Cardiovascular status: blood pressure returned to baseline and stable Postop Assessment: no apparent nausea or vomiting Anesthetic complications: no   No complications documented.  Last Vitals:  Vitals:   08/02/19 2200 08/02/19 2215  BP: (!) 141/55 139/62  Pulse: 83 87  Resp: 12 20  Temp:    SpO2: 95% 99%    Last Pain:  Vitals:   08/02/19 2200  TempSrc:   PainSc: 0-No pain                 Vaishali Baise,W. EDMOND

## 2019-08-02 NOTE — ED Notes (Signed)
Pt placed on bedpan

## 2019-08-03 ENCOUNTER — Inpatient Hospital Stay (HOSPITAL_COMMUNITY): Payer: MEDICARE

## 2019-08-03 ENCOUNTER — Encounter (HOSPITAL_COMMUNITY): Payer: Self-pay | Admitting: Interventional Radiology

## 2019-08-03 DIAGNOSIS — I6389 Other cerebral infarction: Secondary | ICD-10-CM

## 2019-08-03 DIAGNOSIS — E7800 Pure hypercholesterolemia, unspecified: Secondary | ICD-10-CM

## 2019-08-03 DIAGNOSIS — I161 Hypertensive emergency: Secondary | ICD-10-CM

## 2019-08-03 DIAGNOSIS — I480 Paroxysmal atrial fibrillation: Secondary | ICD-10-CM

## 2019-08-03 DIAGNOSIS — I63411 Cerebral infarction due to embolism of right middle cerebral artery: Principal | ICD-10-CM

## 2019-08-03 DIAGNOSIS — Z8673 Personal history of transient ischemic attack (TIA), and cerebral infarction without residual deficits: Secondary | ICD-10-CM

## 2019-08-03 DIAGNOSIS — I63311 Cerebral infarction due to thrombosis of right middle cerebral artery: Secondary | ICD-10-CM

## 2019-08-03 LAB — LIPID PANEL
Cholesterol: 144 mg/dL (ref 0–200)
HDL: 78 mg/dL (ref >40)
LDL Cholesterol: 55 mg/dL (ref 0–99)
Total CHOL/HDL Ratio: 1.8 RATIO
Triglycerides: 53 mg/dL (ref <150)
VLDL: 11 mg/dL (ref 0–40)

## 2019-08-03 LAB — BASIC METABOLIC PANEL
Anion gap: 12 (ref 5–15)
BUN: 7 mg/dL — ABNORMAL LOW (ref 8–23)
CO2: 20 mmol/L — ABNORMAL LOW (ref 22–32)
Calcium: 9.4 mg/dL (ref 8.9–10.3)
Chloride: 108 mmol/L (ref 98–111)
Creatinine, Ser: 1.1 mg/dL — ABNORMAL HIGH (ref 0.44–1.00)
GFR calc Af Amer: 53 mL/min — ABNORMAL LOW (ref >60)
GFR calc non Af Amer: 46 mL/min — ABNORMAL LOW (ref >60)
Glucose, Bld: 143 mg/dL — ABNORMAL HIGH (ref 70–99)
Potassium: 4 mmol/L (ref 3.5–5.1)
Sodium: 140 mmol/L (ref 135–145)

## 2019-08-03 LAB — ECHOCARDIOGRAM COMPLETE: Weight: 2578.5 oz

## 2019-08-03 LAB — CBC
HCT: 39.6 % (ref 36.0–46.0)
Hemoglobin: 12.9 g/dL (ref 12.0–15.0)
MCH: 29.3 pg (ref 26.0–34.0)
MCHC: 32.6 g/dL (ref 30.0–36.0)
MCV: 89.8 fL (ref 80.0–100.0)
Platelets: 282 10*3/uL (ref 150–400)
RBC: 4.41 MIL/uL (ref 3.87–5.11)
RDW: 14 % (ref 11.5–15.5)
WBC: 8.9 10*3/uL (ref 4.0–10.5)
nRBC: 0 % (ref 0.0–0.2)

## 2019-08-03 LAB — MRSA PCR SCREENING: MRSA by PCR: NEGATIVE

## 2019-08-03 IMAGING — MR MR HEAD W/O CM
12 of 14 series · 35 of 48 positions shown · non-contrast
Comparison: CT head, CTA head and neck yesterday.

CLINICAL DATA: 87-year-old female code stroke presentation
yesterday with right MCA M2 NEWAR status post endovascular
recanalization. History of atrial fibrillation.

EXAM:
MRI HEAD WITHOUT CONTRAST
MRA HEAD WITHOUT CONTRAST
TECHNIQUE: Multiplanar, multiecho pulse sequences of the brain and surrounding
structures were obtained without intravenous contrast. Angiographic
images of the head were obtained using MRA technique without
contrast.

[Series 5: DWI · axial · 3.0mm · 0.88mm/px · z∈[-107,+45]mm · 7 of 104 slices shown (1 of 4)]
[im 1/104]
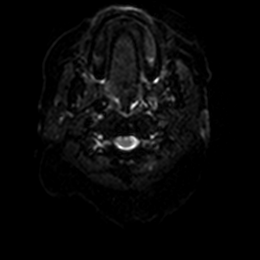
[im 18/104]
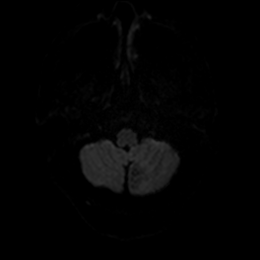
[im 35/104]
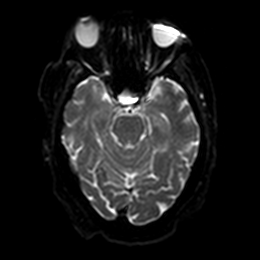
[im 52/104]
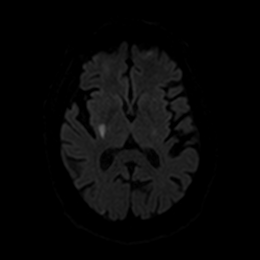
[im 69/104]
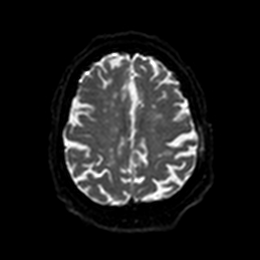
[im 86/104]
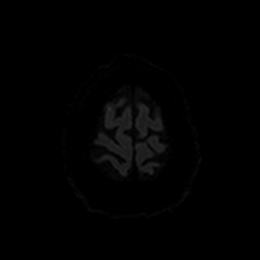
[im 104/104]
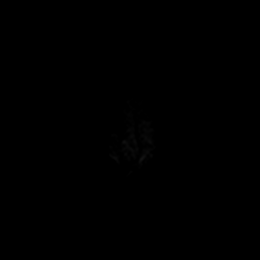

[Series 6: DWI · axial · 3.0mm · 0.88mm/px · z∈[-107,+45]mm · 4 of 52 slices shown (2 of 4)]
[im 1/52]
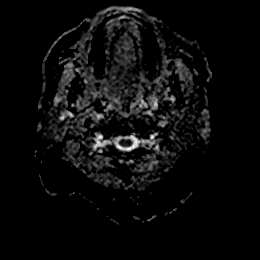
[im 18/52]
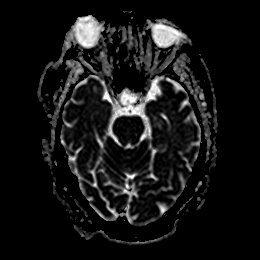
[im 35/52]
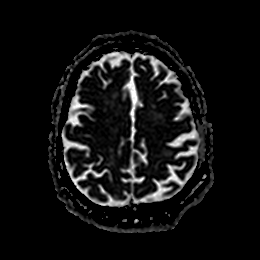
[im 52/52]
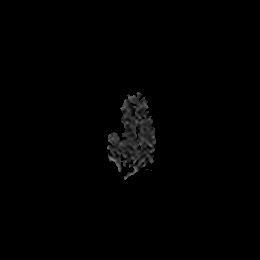

[Series 7: DWI · coronal · 4.0mm · 0.88mm/px · 5 of 72 slices shown (3 of 4)]
[im 1/72]
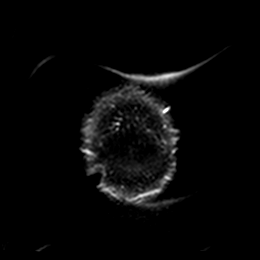
[im 18/72]
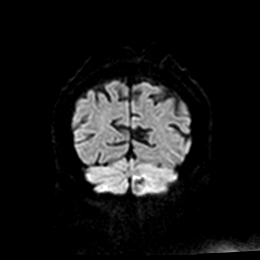
[im 36/72]
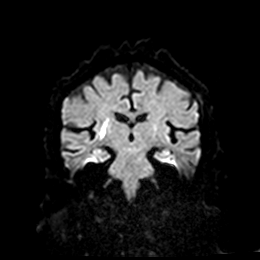
[im 54/72]
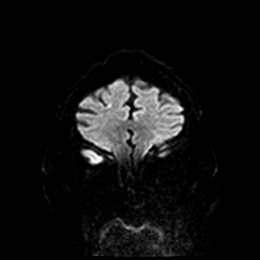
[im 72/72]
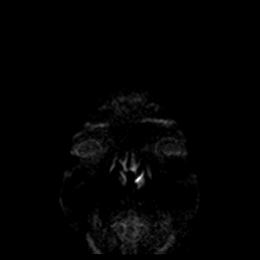

[Series 8: DWI · coronal · 4.0mm · 0.88mm/px · 2 of 36 slices shown (4 of 4)]
[im 1/36]
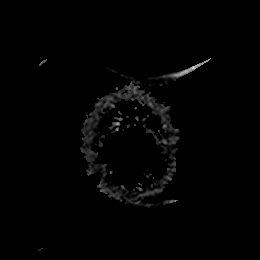
[im 36/36]
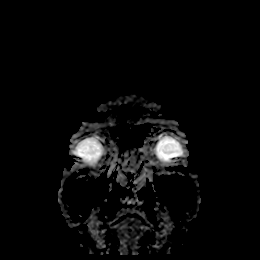

[Series 13: T1 · sagittal · 5.0mm · 0.75mm/px · 1 of 25 slices shown]
[im 1/25]
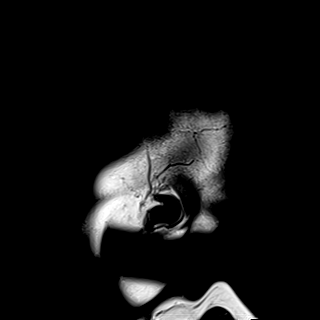

[Series 14: T2 · axial · 5.0mm · 0.72mm/px · 1 of 25 slices shown (1 of 2)]
[im 1/25]
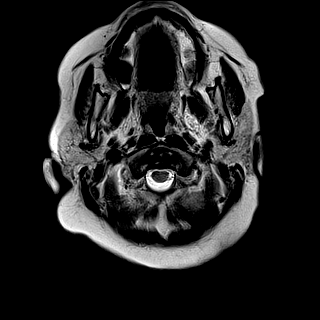

[Series 15: FLAIR · axial · 5.0mm · 0.45mm/px · 1 of 25 slices shown]
[im 1/25]
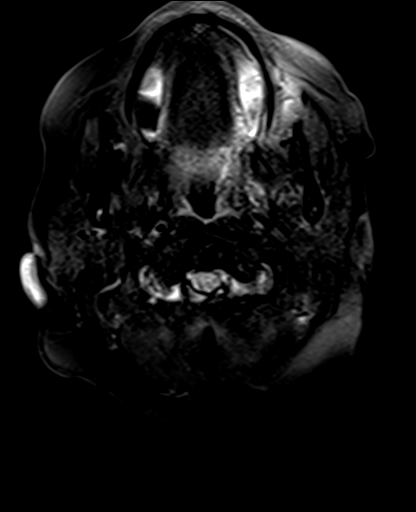

[Series 16: mag_images · axial · 3.0mm · 0.90mm/px · z∈[-108,+67]mm · 3 of 60 slices shown]
[im 1/60]
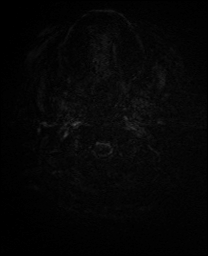
[im 30/60]
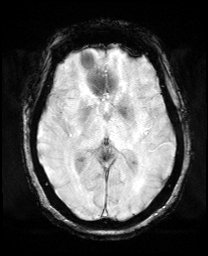
[im 60/60]
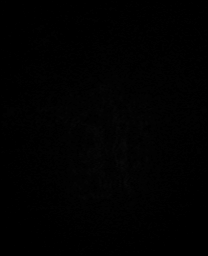

[Series 17: pha_images · axial · 3.0mm · 0.90mm/px · z∈[-108,+64]mm · 3 of 59 slices shown]
[im 1/59]
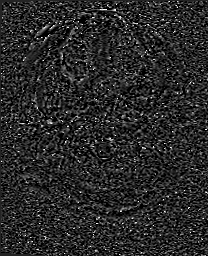
[im 30/59]
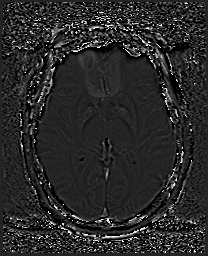
[im 59/59]
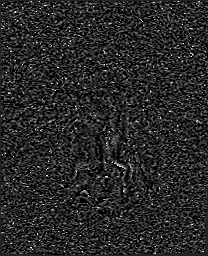

[Series 18: swi_images · axial · 3.0mm · 0.90mm/px · z∈[-108,+67]mm · 3 of 60 slices shown]
[im 1/60]
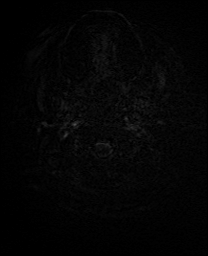
[im 30/60]
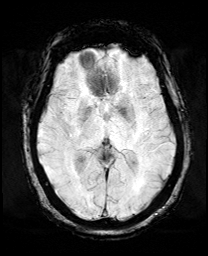
[im 60/60]
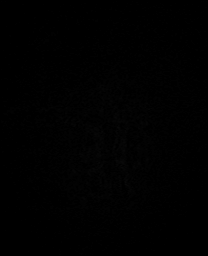

[Series 19: mip_images(sw) · axial · 24.0mm · 0.90mm/px · z∈[-98,+56]mm · 3 of 53 slices shown]
[im 1/53]
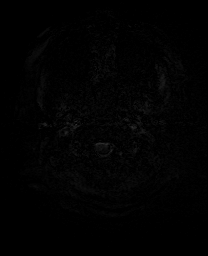
[im 27/53]
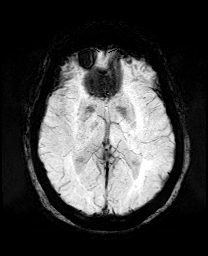
[im 53/53]
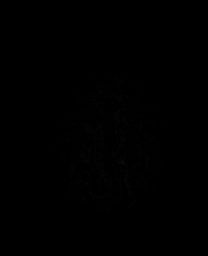

[Series 21: T2 · coronal · 5.0mm · 0.34mm/px · 2 of 29 slices shown (2 of 2)]
[im 1/29]
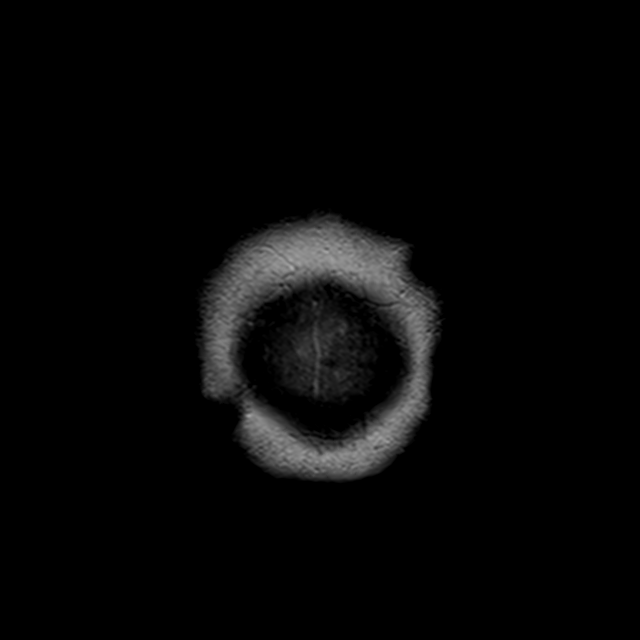
[im 29/29]
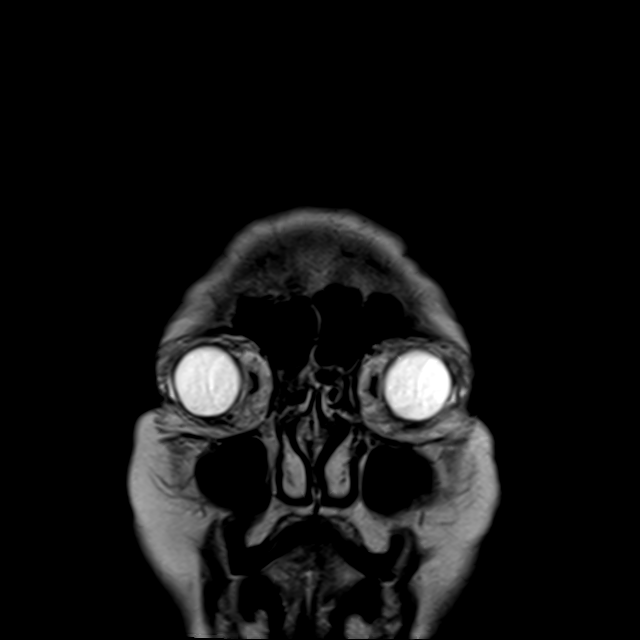

[35 of 48 positions shown; findings below may reference images not displayed]

FINDINGS: MRI HEAD FINDINGS

Brain: Linear restricted diffusion from the posterior right corona
radiata to the posterior right lentiform (series 7, image 54).
Superimposed scattered, generally punctate foci of cortical and
white matter restricted diffusion in the right hemisphere, including
in the right occipital lobe (series 5, image 74).

Furthermore, there are also occasional punctate foci of restricted
diffusion in the left hemisphere (series 5, image 87) and also the
right cerebellum.

No hemorrhage or mass effect associated with the acute ischemia.
Superimposed chronic left cerebellar PICA infarct with hemosiderin.

Minimal chronic microhemorrhage in the hemispheres. Moderate patchy
bilateral cerebral white matter T2 and FLAIR hyperintensity. No
restricted midline shift, mass effect, evidence of mass lesion,
ventriculomegaly, extra-axial collection. Cervicomedullary junction
and pituitary are within normal limits.

Vascular: Major intracranial vascular flow voids are preserved. See
MRA findings below.

Skull and upper cervical spine: Negative visible cervical spine,
bone marrow signal.

Sinuses/Orbits: Negative orbits. Paranasal sinuses remain well
pneumatized.

Other: Mild right mastoid effusion. Negative visible nasopharynx.
Scalp and face soft tissues appear negative.

MRA HEAD FINDINGS

Antegrade flow in the posterior circulation with dominant left
vertebral artery again noted. Patent PICA origins. No distal
vertebral stenosis. Patent vertebrobasilar junction. Patent basilar
artery without stenosis. Patent SCA origins. Fetal bilateral PCA
origins. Bilateral PCA branches are within normal limits.

Antegrade flow in both ICA siphons. No siphon stenosis. Normal
ophthalmic and posterior communicating artery origins. Patent
carotid termini, MCA and ACA origins. Tortuous right A1. Visible ACA
branches are stable and within normal limits. Left MCA M1 and
bifurcation remain patent without stenosis. Visible left MCA
branches are stable and within normal limits.

Right MCA M1 and bifurcation are patent without stenosis. The
recently abnormal posterior right M2 branch remains recanalized
(series 9, image 114 and series [Q8] image 14). Visible right MCA
branches now are within normal limits.
IMPRESSION: 1. Positive for relatively widely scattered but generally punctate
infarcts in the right hemisphere, the largest tracks from the
posterior right corona radiata to the lentiform.
Superimposed occasional punctate infarcts also in the left
hemisphere and cerebellum.
This pattern suggests recent embolic event in the setting of atrial
fibrillation.
No associated acute hemorrhage or mass effect.

2. Intracranial MRA is negative today, with normalized Right MCA M2
branches. Also note bilateral fetal PCA origins.

3. Chronic Left cerebellar PICA infarct with hemosiderin.

## 2019-08-03 IMAGING — MR MR MRA HEAD W/O CM
12 of 14 series · 35 of 48 positions shown · non-contrast
Comparison: CT head, CTA head and neck yesterday.

CLINICAL DATA: 87-year-old female code stroke presentation
yesterday with right MCA M2 NEWAR status post endovascular
recanalization. History of atrial fibrillation.

EXAM:
MRI HEAD WITHOUT CONTRAST
MRA HEAD WITHOUT CONTRAST
TECHNIQUE: Multiplanar, multiecho pulse sequences of the brain and surrounding
structures were obtained without intravenous contrast. Angiographic
images of the head were obtained using MRA technique without
contrast.

[Series 5: DWI · axial · 3.0mm · 0.88mm/px · z∈[-107,+45]mm · 7 of 104 slices shown (1 of 4)]
[im 1/104]
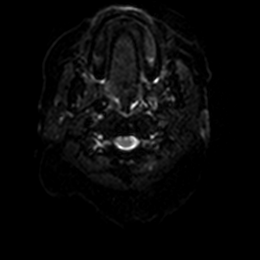
[im 18/104]
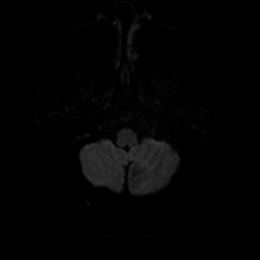
[im 35/104]
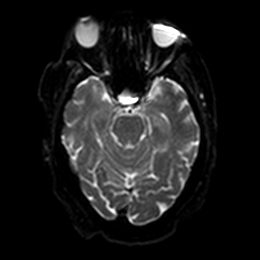
[im 52/104]
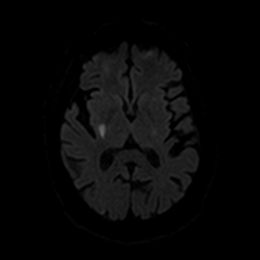
[im 69/104]
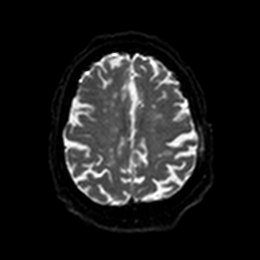
[im 86/104]
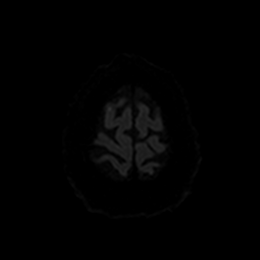
[im 104/104]
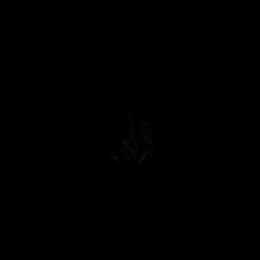

[Series 6: DWI · axial · 3.0mm · 0.88mm/px · z∈[-107,+45]mm · 4 of 52 slices shown (2 of 4)]
[im 1/52]
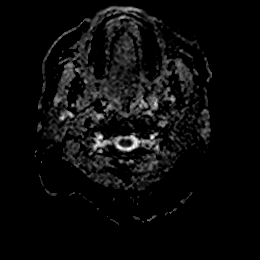
[im 18/52]
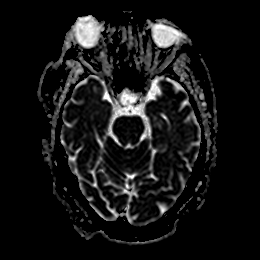
[im 35/52]
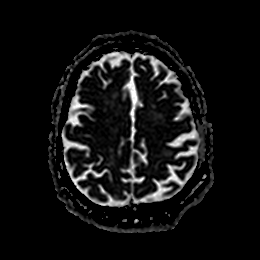
[im 52/52]
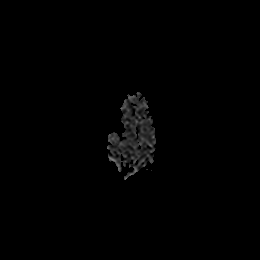

[Series 7: DWI · coronal · 4.0mm · 0.88mm/px · 5 of 72 slices shown (3 of 4)]
[im 1/72]
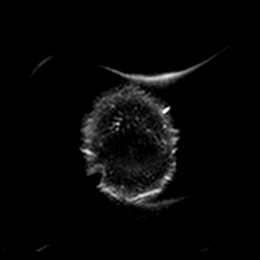
[im 18/72]
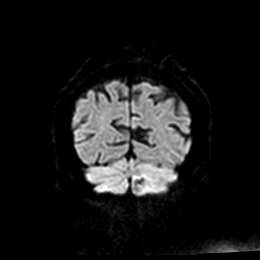
[im 36/72]
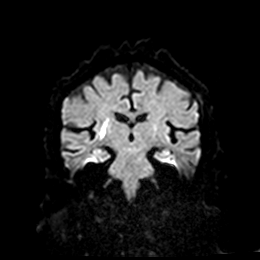
[im 54/72]
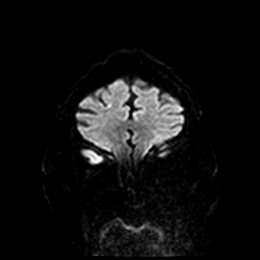
[im 72/72]
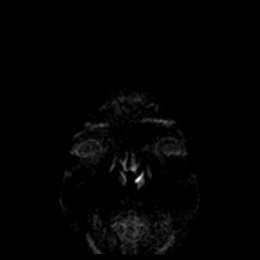

[Series 8: DWI · coronal · 4.0mm · 0.88mm/px · 2 of 36 slices shown (4 of 4)]
[im 1/36]
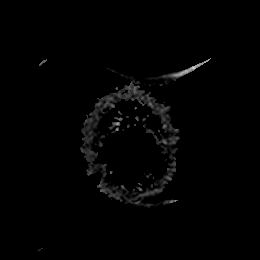
[im 36/36]
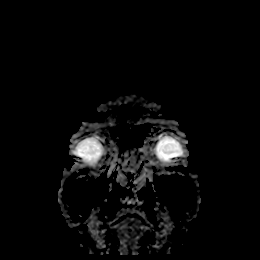

[Series 13: T1 · sagittal · 5.0mm · 0.75mm/px · 1 of 25 slices shown]
[im 1/25]
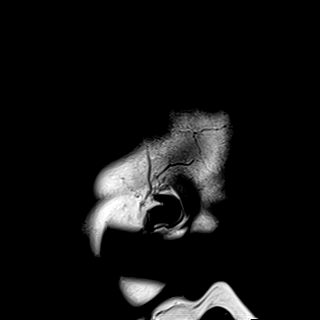

[Series 14: T2 · axial · 5.0mm · 0.72mm/px · 1 of 25 slices shown (1 of 2)]
[im 1/25]
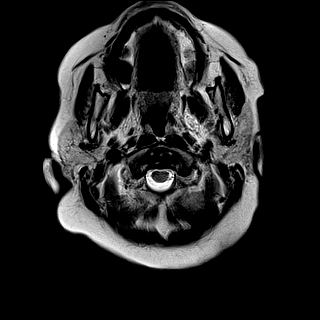

[Series 15: FLAIR · axial · 5.0mm · 0.45mm/px · 1 of 25 slices shown]
[im 1/25]
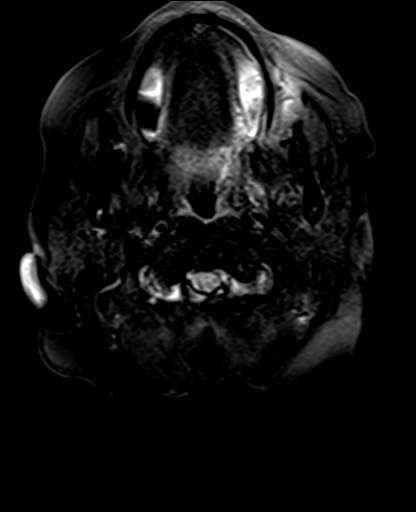

[Series 16: mag_images · axial · 3.0mm · 0.90mm/px · z∈[-108,+67]mm · 3 of 60 slices shown]
[im 1/60]
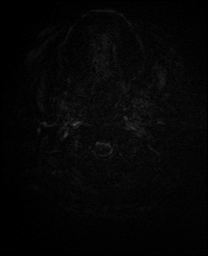
[im 30/60]
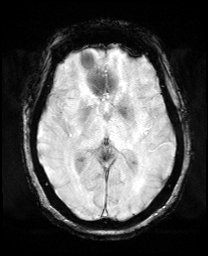
[im 60/60]
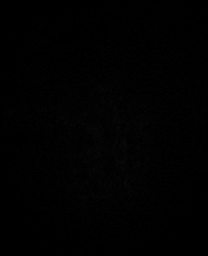

[Series 17: pha_images · axial · 3.0mm · 0.90mm/px · z∈[-108,+64]mm · 3 of 59 slices shown]
[im 1/59]
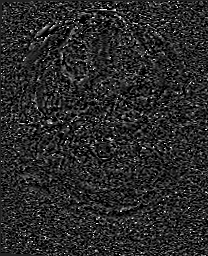
[im 30/59]
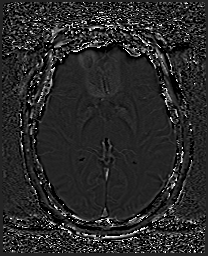
[im 59/59]
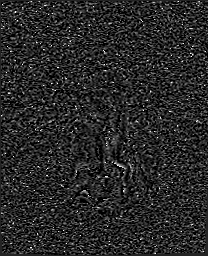

[Series 18: swi_images · axial · 3.0mm · 0.90mm/px · z∈[-108,+67]mm · 3 of 60 slices shown]
[im 1/60]
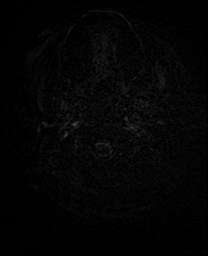
[im 30/60]
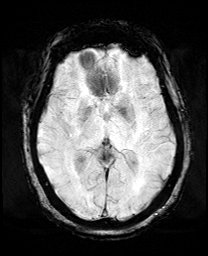
[im 60/60]
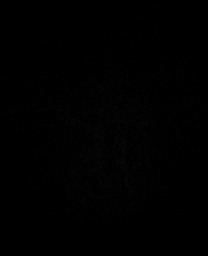

[Series 19: mip_images(sw) · axial · 24.0mm · 0.90mm/px · z∈[-98,+56]mm · 3 of 53 slices shown]
[im 1/53]
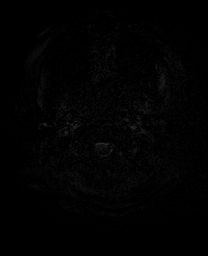
[im 27/53]
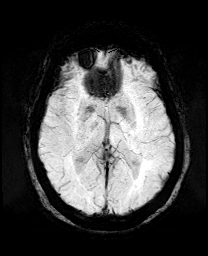
[im 53/53]
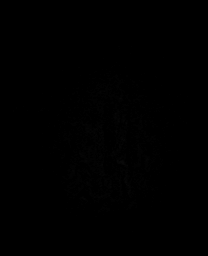

[Series 21: T2 · coronal · 5.0mm · 0.34mm/px · 2 of 29 slices shown (2 of 2)]
[im 1/29]
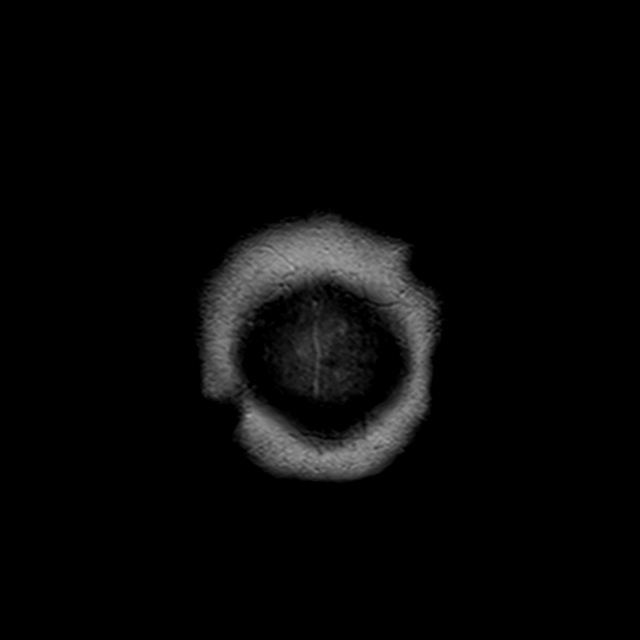
[im 29/29]
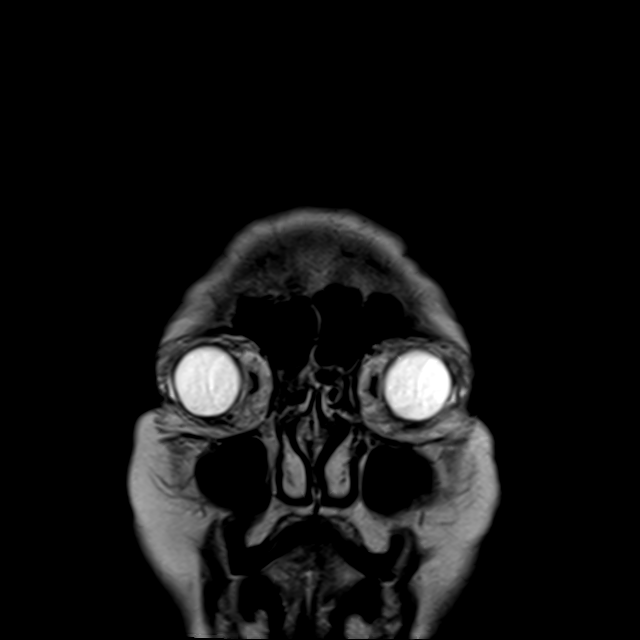

[35 of 48 positions shown; findings below may reference images not displayed]

FINDINGS: MRI HEAD FINDINGS

Brain: Linear restricted diffusion from the posterior right corona
radiata to the posterior right lentiform (series 7, image 54).
Superimposed scattered, generally punctate foci of cortical and
white matter restricted diffusion in the right hemisphere, including
in the right occipital lobe (series 5, image 74).

Furthermore, there are also occasional punctate foci of restricted
diffusion in the left hemisphere (series 5, image 87) and also the
right cerebellum.

No hemorrhage or mass effect associated with the acute ischemia.
Superimposed chronic left cerebellar PICA infarct with hemosiderin.

Minimal chronic microhemorrhage in the hemispheres. Moderate patchy
bilateral cerebral white matter T2 and FLAIR hyperintensity. No
restricted midline shift, mass effect, evidence of mass lesion,
ventriculomegaly, extra-axial collection. Cervicomedullary junction
and pituitary are within normal limits.

Vascular: Major intracranial vascular flow voids are preserved. See
MRA findings below.

Skull and upper cervical spine: Negative visible cervical spine,
bone marrow signal.

Sinuses/Orbits: Negative orbits. Paranasal sinuses remain well
pneumatized.

Other: Mild right mastoid effusion. Negative visible nasopharynx.
Scalp and face soft tissues appear negative.

MRA HEAD FINDINGS

Antegrade flow in the posterior circulation with dominant left
vertebral artery again noted. Patent PICA origins. No distal
vertebral stenosis. Patent vertebrobasilar junction. Patent basilar
artery without stenosis. Patent SCA origins. Fetal bilateral PCA
origins. Bilateral PCA branches are within normal limits.

Antegrade flow in both ICA siphons. No siphon stenosis. Normal
ophthalmic and posterior communicating artery origins. Patent
carotid termini, MCA and ACA origins. Tortuous right A1. Visible ACA
branches are stable and within normal limits. Left MCA M1 and
bifurcation remain patent without stenosis. Visible left MCA
branches are stable and within normal limits.

Right MCA M1 and bifurcation are patent without stenosis. The
recently abnormal posterior right M2 branch remains recanalized
(series 9, image 114 and series [Q8] image 14). Visible right MCA
branches now are within normal limits.
IMPRESSION: 1. Positive for relatively widely scattered but generally punctate
infarcts in the right hemisphere, the largest tracks from the
posterior right corona radiata to the lentiform.
Superimposed occasional punctate infarcts also in the left
hemisphere and cerebellum.
This pattern suggests recent embolic event in the setting of atrial
fibrillation.
No associated acute hemorrhage or mass effect.

2. Intracranial MRA is negative today, with normalized Right MCA M2
branches. Also note bilateral fetal PCA origins.

3. Chronic Left cerebellar PICA infarct with hemosiderin.

## 2019-08-03 MED ORDER — FUROSEMIDE 20 MG PO TABS
20.0000 mg | ORAL_TABLET | Freq: Every day | ORAL | Status: DC
  Administered 2019-08-03 – 2019-08-07 (×5): 20 mg via ORAL
  Filled 2019-08-03 (×5): qty 1

## 2019-08-03 MED ORDER — APIXABAN 5 MG PO TABS
5.0000 mg | ORAL_TABLET | Freq: Two times a day (BID) | ORAL | Status: DC
  Administered 2019-08-04 – 2019-08-07 (×7): 5 mg via ORAL
  Filled 2019-08-03 (×7): qty 1

## 2019-08-03 MED ORDER — METOPROLOL TARTRATE 25 MG PO TABS
25.0000 mg | ORAL_TABLET | Freq: Two times a day (BID) | ORAL | Status: DC
  Administered 2019-08-03 – 2019-08-07 (×9): 25 mg via ORAL
  Filled 2019-08-03 (×8): qty 1
  Filled 2019-08-03: qty 2

## 2019-08-03 MED ORDER — CLOPIDOGREL BISULFATE 75 MG PO TABS
75.0000 mg | ORAL_TABLET | Freq: Every day | ORAL | Status: DC
  Administered 2019-08-03: 75 mg via ORAL
  Filled 2019-08-03: qty 1

## 2019-08-03 MED ORDER — HEPARIN SODIUM (PORCINE) 5000 UNIT/ML IJ SOLN
5000.0000 [IU] | Freq: Three times a day (TID) | INTRAMUSCULAR | Status: AC
  Administered 2019-08-03 (×2): 5000 [IU] via SUBCUTANEOUS
  Filled 2019-08-03 (×2): qty 1

## 2019-08-03 MED ORDER — WHITE PETROLATUM EX OINT
TOPICAL_OINTMENT | CUTANEOUS | Status: AC
  Administered 2019-08-03: 0.2
  Filled 2019-08-03: qty 28.35

## 2019-08-03 NOTE — Progress Notes (Signed)
OT Cancellation Note  Patient Details Name: Jodi [name] MRN: [id number] DOB: Apr 07, 1932   Cancelled Treatment:    Reason Eval/Treat Not Completed: Patient at procedure or test/ unavailable.  Pt at MRI.  Will reattempt as appropriate  [name], OTR/L Acute Rehabilitation Services Pager [phone] Office [phone]   [name] 08/03/2019, 9:35 AM

## 2019-08-03 NOTE — Evaluation (Signed)
Occupational Therapy Evaluation Patient Details Name: Jodi [name] MRN: [id number] DOB: Jun 11, 1932 Today's Date: 08/03/2019    History of Present Illness 86 y.o. female with a recent cerebellar stroke on June 6, discharged from [facility] on June 11, started on Plavix and Lipitor, who presented as code stroke to the [facility] ED this afternoon. She had reportedly been acting normal all day with her daughter and then became confused, using inappropriate words and not acting normally. CTA head and neck revealed R M2 occlusion. Pt underwent cerebral angiogram and thrombectomy on 6/17.   Clinical Impression   PTA pt living alone, reports being independent with BADL/IADL. Pt has hx of recent cerebellar CVA and per RN pt daughter plans move in with patient. At time of eval, pt able to complete bed mobility at min A level and sit <> stands with min A and RW. Noted LUE coordination and proprioception deficits with evaluation. Also noted cognitive deficits in problem solving during neuro testing. Given current status and increased fall and safety risk, recommend pt d/c to CIR for continued BADL training. Will continue to follow per POC listed below.     Follow Up Recommendations  CIR    Equipment Recommendations  Other (comment) (TBD)    Recommendations for Other Services Rehab consult     Precautions / Restrictions Precautions Precautions: Fall Restrictions Weight Bearing Restrictions: No      Mobility Bed Mobility Overal bed mobility: Needs Assistance Bed Mobility: Supine to Sit     Supine to sit: Min assist     General bed mobility comments: up in chair  Transfers Overall transfer level: Needs assistance Equipment used: Rolling walker (2 wheeled) Transfers: Sit to/from [facility] Sit to Stand: Min assist Stand pivot transfers: Min assist       General transfer comment: pt initially without device, some improvement with use of RW to power up to  standing    Balance Overall balance assessment: Needs assistance Sitting-balance support: No upper extremity supported;Feet supported Sitting balance-Leahy Scale: Poor Sitting balance - Comments: minA due to posterior lean Postural control: Posterior lean Standing balance support: Bilateral upper extremity supported Standing balance-Leahy Scale: Fair Standing balance comment: minG with BUE support of RW                           ADL either performed or assessed with clinical judgement   ADL Overall ADL's : Needs assistance/impaired Eating/Feeding: Set up;Sitting   Grooming: Set up;Sitting   Upper Body Bathing: Minimal assistance;Sitting   Lower Body Bathing: Moderate assistance;Sit to/from stand;Sitting/lateral leans   Upper Body Dressing : Minimal assistance;Sitting   Lower Body Dressing: Moderate assistance;Sit to/from stand;Sitting/lateral leans   Toilet Transfer: Moderate assistance;Stand-pivot;BSC   Toileting- Clothing Manipulation and Hygiene: Moderate assistance;Sitting/lateral lean;Sit to/from stand       Functional mobility during ADLs: Moderate assistance;Cueing for safety;Cueing for sequencing;Rolling walker       Vision Baseline Vision/History: Cataracts Patient Visual Report: No change from baseline Vision Assessment?: No apparent visual deficits Additional Comments: reports L eye cataract needing to be fixed, otherwise vision WNL     Perception     Praxis      Pertinent Vitals/Pain Pain Assessment: No/denies pain     Hand Dominance     Extremity/Trunk Assessment Upper Extremity Assessment Upper Extremity Assessment: LUE deficits/detail LUE Deficits / Details: diffuse weakness with poor proprioception and motor control LUE Sensation: decreased proprioception LUE Coordination: decreased fine motor;decreased gross  motor   Lower Extremity Assessment Lower Extremity Assessment: Generalized weakness   Cervical / Trunk  Assessment Cervical / Trunk Assessment: Normal   Communication Communication Communication: No difficulties   Cognition Arousal/Alertness: Awake/alert Behavior During Therapy: WFL for tasks assessed/performed Overall Cognitive Status: Impaired/Different from baseline Area of Impairment: Problem solving                             Problem Solving: Slow processing;Difficulty sequencing;Requires verbal cues General Comments: difficulty processing multi step commands with testing   General Comments  VSS on RA. Pt with some dysmetria noted in LUE. Pt also reports L vision deficits chronically due to cataracts    Exercises     Shoulder Instructions      Home Living Family/patient expects to be discharged to:: Private residence Living Arrangements: Alone Available Help at Discharge: Family Type of Home: House Home Access: Stairs to enter Technical brewer of Steps: 3 Entrance Stairs-Rails:  (unure) Home Layout: One level     Bathroom Shower/Tub: Teacher, early years/pre: Standard     Home Equipment: Environmental consultant - 2 wheels;Wheelchair - manual   Additional Comments: RN does report after session that the patient's daughter was going to sign an apartment lease today so the patient and daughter could live together  Lives With: Alone    Prior Functioning/Environment Level of Independence: Independent        Comments: pt reports that she had progressed to ambulating without an assistive device since most recent stroke        OT Problem List: Decreased strength;Decreased knowledge of use of DME or AE;Decreased range of motion;Decreased coordination;Decreased activity tolerance;Decreased cognition;Impaired UE functional use;Impaired balance (sitting and/or standing)      OT Treatment/Interventions: Self-care/ADL training;Therapeutic exercise;Patient/family education;Balance training;Energy conservation;Therapeutic activities;Neuromuscular education;DME  and/or AE instruction;Cognitive remediation/compensation    OT Goals(Current goals can be found in the care plan section) Acute Rehab OT Goals Patient Stated Goal: return to independence OT Goal Formulation: With patient Time For Goal Achievement: 08/17/19 Potential to Achieve Goals: Good  OT Frequency: Min 2X/week   Barriers to D/C:            Co-evaluation              AM-PAC OT "6 Clicks" Daily Activity     Outcome Measure Help from another person eating meals?: None Help from another person taking care of personal grooming?: A Little Help from another person toileting, which includes using toliet, bedpan, or urinal?: A Lot Help from another person bathing (including washing, rinsing, drying)?: A Lot Help from another person to put on and taking off regular upper body clothing?: A Little Help from another person to put on and taking off regular lower body clothing?: A Lot 6 Click Score: 16   End of Session Equipment Utilized During Treatment: Rolling walker Nurse Communication: Mobility status  Activity Tolerance: Patient tolerated treatment well Patient left: in chair;with call bell/phone within reach;with chair alarm set  OT Visit Diagnosis: Other abnormalities of gait and mobility (R26.89);Muscle weakness (generalized) (M62.81);Hemiplegia and hemiparesis Hemiplegia - Right/Left: Left Hemiplegia - dominant/non-dominant: Dominant Hemiplegia - caused by: Cerebral infarction                Time: 1528-1600 OT Time Calculation (min): 32 min Charges:  OT General Charges $OT Visit: 1 Visit OT Evaluation $OT Eval Moderate Complexity: 1 Mod OT Treatments $Self Care/Home Management : 8-22 mins  Zenovia Jarred,  MSOT, OTR/L Acute Rehabilitation Services [facility] Office Number: [phone] Pager: [phone]  Zenovia Jarred 08/03/2019, 6:04 PM

## 2019-08-03 NOTE — Progress Notes (Signed)
Echocardiogram 2D Echocardiogram has been performed.  Oneal Deputy Modene Andy 08/03/2019, 11:13 AM

## 2019-08-03 NOTE — Progress Notes (Signed)
Rehab Admissions Coordinator Note:  Per PT and OT recommendation, this patient was screened by Raechel Ache for appropriateness for an Inpatient Acute Rehab Consult.  At this time, we are recommending Inpatient Rehab consult. AC will contact MD to request order.   Raechel Ache 08/03/2019, 6:08 PM  I can be reached at [phone].

## 2019-08-03 NOTE — Progress Notes (Signed)
STROKE TEAM PROGRESS NOTE   INTERVAL HISTORY Her RN is at the bedside.  Pt reclining in bed, not in distress. Overnight stable, passed swallow, now on heart healthy diet. Still has mild left facial droop and mild arm and leg drift. MRI showed small punctate right MCA infarcts. She had recent admission at [facility]  for left cerebellar stroke. Has hx of paroxysmal aflutter, per [facility] note, she was on Xarelto before but d/c due to GIB. However, pt denies any GIB.  Daughter later came in, denies hx of GIB or Xarelto use. Pt was on 30 day monitoring after [facility] discharge but now on hold given admission. Discussed with daughter about recurrent embolic strokes and hx of aflutter, she is in agreement with [facility]. Pt MRI showed small punctate right MCA infarcts but also punctate left MCA/ACA and right cerebellar infarcts. Subacute left cerebellar infarcts with petechial hemorrhage.   OBJECTIVE Vitals:   08/03/19 0530 08/03/19 0545 08/03/19 0600 08/03/19 0615  BP: (!) 124/55 (!) 126/51 (!) 120/51 (!) 142/65  Pulse: 68 68 69 74  Resp: 14 (!) 23 (!) 26 19  Temp:      TempSrc:      SpO2: 96% 96% 95% 98%  Weight:        CBC:  Recent Labs  Lab 08/02/19 1640 08/02/19 1730  WBC  --  6.7  NEUTROABS  --  4.4  HGB 12.6 12.3  HCT 37.0 38.7  MCV  --  90.0  PLT  --  676    Basic Metabolic Panel:  Recent Labs  Lab 08/02/19 1602 08/02/19 1640  NA 141 142  K 4.2 3.8  CL 104 101  CO2 26  --   GLUCOSE 126* 120*  BUN 9 8  CREATININE 1.20* 1.20*  CALCIUM 9.8  --     Lipid Panel: No results found for: CHOL, TRIG, HDL, CHOLHDL, VLDL, LDLCALC HgbA1c: No results found for: HGBA1C Urine Drug Screen: No results found for: LABOPIA, COCAINSCRNUR, LABBENZ, AMPHETMU, THCU, LABBARB  Alcohol Level No results found for: ETH  IMAGING  CT Angio Head W or Wo Contrast CT Angio Neck W and/or Wo Contrast 08/02/2019 IMPRESSION:  Occlusion of a right proximal M2 branch. No other abnormal anterior circulation finding.  Posterior circulation intracranial branch vessels are intact. No carotid bifurcation disease.  No vertebral artery origin disease. Minimal aortic atherosclerosis.   CT HEAD CODE STROKE WO CONTRAST 08/02/2019 IMPRESSION:  Chronic small-vessel ischemic changes of the cerebral hemispheric white matter. Probable acute or subacute infarction in the left cerebellar hemisphere. No mass effect or hemorrhage.   Neuro Interventional Radiology - Cerebral Angiogram with Intervention - Dr. Roylene Reason Sindy Messing 08/02/19 Mid right M2/MCA posterior division branch occlusion. Mechanical thrombectomy performed with direct contact aspiration. Total one pass, TICI3.  Transthoracic Echocardiogram  1. Left ventricular ejection fraction, by estimation, is 60 to 65%. The  left ventricle has normal function. The left ventricle has no regional  wall motion abnormalities. There is mild concentric left ventricular  hypertrophy. Left ventricular diastolic  parameters are consistent with Grade II diastolic dysfunction  (pseudonormalization). Elevated left atrial pressure.  2. Right ventricular systolic function is normal. The right ventricular  size is normal. There is mildly elevated pulmonary artery systolic  pressure. The estimated right ventricular systolic pressure is 19.5 mmHg.  3. Left atrial size was mildly dilated.  4. The mitral valve is normal in structure. No evidence of mitral valve  regurgitation. No evidence of mitral stenosis.  5. The aortic  valve is normal in structure. Aortic valve regurgitation is  not visualized. No aortic stenosis is present.  6. The inferior vena cava is normal in size with greater than 50%  respiratory variability, suggesting right atrial pressure of 3 mmHg.   ECG - Sinus or ectopic atrial rhythym rate 63 BPM. (See cardiology reading for complete details)   PHYSICAL EXAM  Temp:  [97.9 F (36.6 C)-98.4 F (36.9 C)] 98.1 F (36.7 C) (06/18 0800) Pulse Rate:   [51-91] 75 (06/18 0800) Resp:  [12-30] 15 (06/18 0800) BP: (100-181)/(31-116) 129/48 (06/18 0900) SpO2:  [90 %-100 %] 96 % (06/18 0800) Weight:  [73.1 kg-73.4 kg] 73.1 kg (06/17 2130)  General - Well nourished, well developed, in no apparent distress.  Ophthalmologic - fundi not visualized due to noncooperation.  Cardiovascular - Regular rhythm and rate, not in afib or aflutter.  Mental Status -  Level of arousal and orientation to time, place, and person were intact. Language including expression, repetition, comprehension was assessed and found intact. Naming 3/4  Cranial Nerves II - XII - II - Visual field intact OU. III, IV, VI - Extraocular movements intact. V - Facial sensation intact bilaterally. VII - left mild facial nasolabial fold flatterning. VIII - Hearing & vestibular intact bilaterally. X - Palate elevates symmetrically. XI - Chin turning & shoulder shrug intact bilaterally XII - Tongue protrusion intact.  Motor Strength - The patient's strength was normal in RUE and RLE, however, left UE 4/5 with pronator drift and LLE 4/5 with drift too.  Bulk was normal and fasciculations were absent.   Motor Tone - Muscle tone was assessed at the neck and appendages and was normal.  Reflexes - The patient's reflexes were symmetrical in all extremities and she had no pathological reflexes.  Sensory - Light touch, temperature/pinprick were assessed and were symmetrical subjectively.    Coordination - The patient had normal movements in the right hand with no ataxia or dysmetria. Mild ataxia on the left FTN still proportional to the weakness Tremor was absent.  Gait and Station - deferred.   ASSESSMENT/PLAN [name] is a 86 y.o. female with history of atrial fibrillation (Plavix PTA), CAD, CKD, Hld, Htn, stomach cancer, aspirin allergy, recent cerebellar stroke on June 6 [facility]), presenting with confusion and speech difficulties. She did not receive IV t-PA due to  recent stroke Mechanical thrombectomy mid right M2/MCA posterior division branch occlusion. Total one pass, TICI3.  Stroke: right MCA small/punctate scattered infarcts with punctate left MCA/ACA and right cerebellum infarcts with right M2 occlusion s/p TICI3 reperfusion - embolic pattern due to paroxysmal AF or Aflutter  CT Head - subacute infarction in the left cerebellar hemisphere. No mass effect or hemorrhage.   CTA H&N - Occlusion of a right proximal M2 branch.    IR - thrombectomy with TICI3 revascularization  MRI head - right MCA small/punctate scattered infarcts with punctate left MCA/ACA and right cerebellum infarcts  MRA head - right MCA M2 patent  2D Echo EF 60 to 65%  [employer] Virus 2 - negative  LDL 55  HgbA1c - pending  VTE prophylaxis - SCDs  clopidogrel 75 mg daily prior to admission, now on  Plavix 75.  Will switch to Eliquis tomorrow for stroke prevention given current small size infarcts.  Patient counseled to be compliant with her antithrombotic medications  Ongoing aggressive stroke risk factor management  Therapy recommendations: CIR  Disposition:  Pending  Hx of stroke  6/6-6/11 dizziness - admission to  [facility] for left cerebellar infarct.  CTA head and neck negative.  Put on Plavix and statin on discharge.  Recommend heart monitor for 30 days as outpatient.  Heart monitor on hold during current admission  This time MRI showed subacute left cerebellum infarct with petechial hemorrhage  Paroxysmal a flutter versus A. Fib  Paroxysmal a flutter in 2013 status post cardioversion  Paroxysmal a flutter in 2014 status post cardioversion  As per [facility] note, patient was on Xarelto but stopped due to GI bleeding  Per patient and daughter, patient was not on Xarelto before and had no GI bleeding.  Most likely the cause of recent recurrent embolic strokes  We will start Eliquis tomorrow  Hypertension  Home BP meds: Lopressor ; Calan ; Apresoline ;  Zestril  Current BP meds: metoprolo; verapamil  On cleviprex  Stable . BP goal 120-140 within 24h of procedure, then relax to < 180 . Long-term BP goal normotensive  Hyperlipidemia  Home Lipid lowering medication: Lipitor 40 mg daily  LDL 55, goal < 70  Current lipid lowering medication: Lipitor 40 mg daily  Continue statin at discharge  Other Stroke Risk Factors  Advanced age  Coronary artery disease  Other Active Problems  Code status - Full Code  Aspirin allergy - anaphylaxis  CKD - stage 3b - creatinine - 1.20  Hospital day # 1  This patient is critically ill due to right MCA occlusion, embolic stroke, recurrent embolic strokes, paroxysmal A. fib, status post thrombectomy, hypertensive emergency on IV Cleviprex and at significant risk of neurological worsening, death form recurrent stroke, hemorrhagic conversion, seizure, heart failure, hypertensive encephalopathy. This patient's care requires constant monitoring of vital signs, hemodynamics, respiratory and cardiac monitoring, review of multiple databases, neurological assessment, discussion with family, other specialists and medical decision making of high complexity. I spent 40 minutes of neurocritical care time in the care of this patient. I had long discussion with daughter and patient at bedside, updated pt current condition, treatment plan and potential prognosis, and answered all the questions.  They expressed understanding and appreciation.   Rosalin Hawking, MD PhD Stroke Neurology 08/03/2019 8:06 PM    To contact Stroke Continuity provider, please refer to [email or web address] After hours, contact General Neurology

## 2019-08-03 NOTE — Progress Notes (Signed)
[place] for Eliquis Indication: atrial fibrillation  Allergies  Allergen Reactions  . Aciphex [Rabeprazole]     Hives  . Aspirin     Anaphylaxis  . Hydralazine     Nausea  . Morphine And Related     Emesis    Patient Measurements: Weight: 73.1 kg (161 lb 2.5 oz)  Vital Signs: Temp: 98.1 F (36.7 C) (06/18 0800) Temp Source: Oral (06/18 0800) BP: 129/48 (06/18 0900) Pulse Rate: 75 (06/18 0800)  Labs: Recent Labs    08/02/19 1602 08/02/19 1640 08/02/19 1730  HGB  --  12.6 12.3  HCT  --  37.0 38.7  PLT  --   --  249  APTT  --   --  30  LABPROT  --   --  13.1  INR  --   --  1.0  CREATININE 1.20* 1.20*  --     CrCl cannot be calculated (Unknown ideal weight.).   Medical History: Past Medical History:  Diagnosis Date  . Atrial fibrillation ([place])   . CAD (coronary artery disease)   . CKD (chronic kidney disease) stage 3, GFR 30-59 ml/min   . CVA (cerebral vascular accident) ([place])   . HLD (hyperlipidemia)   . HTN (hypertension)   . 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([place])     Medications:  Scheduled:  . [START ON 08/04/2019] apixaban  5 mg Oral BID  . atorvastatin  40 mg Oral QHS  . Chlorhexidine Gluconate Cloth  6 each Topical Daily  . furosemide  20 mg Oral Daily  . heparin injection (subcutaneous)  5,000 Units Subcutaneous Q8H  . metoprolol tartrate  25 mg Oral BID  . sodium chloride flush  3 mL Intravenous Once  . verapamil  160 mg Oral BID   Infusions:  . sodium chloride 40 mL/hr at 08/03/19 1009  . clevidipine 11 mg/hr (08/03/19 1000)    Assessment: [name] admitted on 6/17 for new stroke. Recent discharge from [facility] on 6/11 for cerebellar stroke. Patient has a history of atrial fibrillation but was not on anticoagulation PTA. Per care everywhere note from [facility], patient had been on Xarelto in the past but this was stopped due to GIB. After MD discussion with family today, plan to start Eliquis tomorrow.  Goal of Therapy:   Monitor platelets by anticoagulation protocol: Yes   Plan:  Eliquis 5 mg BID starting 6/19 Continue subcutaneous heparin today - stop date placed Will educate patient/family prior to discharge  Vertis Kelch, PharmD, [facility] PGY2 Cardiology Pharmacy Resident Phone [phone] 08/03/2019       10:53 AM  Please check [email or web address] for unit-specific pharmacist phone numbers

## 2019-08-03 NOTE — Progress Notes (Signed)
Referring Physician(s): * No referring provider recorded for this case *  Supervising Physician: Pedro Earls  Patient Status:  [name] - In-pt  Chief Complaint: Code Stroke  Subjective: Patient assessed at bedside alongside Dr. Karenann Cai. She is alert and oriented. Following commands. Moving all extremities. Passed swallow eval.  Slight left pronator drift noted, but improved from what was previously described this AM.   Allergies: Aciphex [rabeprazole], Aspirin, Hydralazine, and Morphine and related  Medications: Prior to Admission medications   Medication Sig Start Date End Date Taking? Authorizing Provider  albuterol (VENTOLIN HFA) 108 (90 Base) MCG/ACT inhaler Inhale 2 puffs into the lungs every 4 (four) hours as needed for wheezing or shortness of breath.  07/13/19  Yes [provider]  atorvastatin (LIPITOR) 40 MG tablet Take 40 mg by mouth at bedtime. 07/27/19  Yes [provider]  Cholecalciferol (VITAMIN D) 50 MCG (2000 UT) CAPS Take 1 capsule by mouth daily.   Yes [provider]  clopidogrel (PLAVIX) 75 MG tablet Take 75 mg by mouth daily. 07/27/19  Yes [provider]  furosemide (LASIX) 20 MG tablet Take 20 mg by mouth daily. 07/04/19  Yes [provider]  hydrALAZINE (APRESOLINE) 25 MG tablet Take 25 mg by mouth 2 (two) times daily. 06/18/19  Yes [provider]  lisinopril (ZESTRIL) 20 MG tablet Take 20 mg by mouth daily. 07/13/19  Yes [provider]  meclizine (ANTIVERT) 12.5 MG tablet Take 12.5 mg by mouth 3 (three) times daily as needed for dizziness.  07/27/19  Yes [provider]  metoprolol tartrate (LOPRESSOR) 50 MG tablet Take 50 mg by mouth 2 (two) times daily. 07/04/19  Yes [provider]  senna (SENOKOT) 8.6 MG tablet Take 1 tablet by mouth daily as needed for constipation.   Yes [provider]  triamcinolone (KENALOG) 0.1 % paste Use as directed 1  application in the mouth or throat 2 (two) times daily.   Yes [provider]  verapamil (CALAN) 80 MG tablet Take 160 mg by mouth 2 (two) times daily.  08/02/19  Yes [provider]     Vital Signs: BP (!) 129/55   Pulse 64   Temp 98.6 F (37 C) (Oral)   Resp 15   Wt 161 lb 2.5 oz (73.1 kg)   SpO2 97%   Physical Exam  NAD, alert MSK: R wrist intact. Dressing in place without bleeding.  No hemtoma or tenderness noted.  Neuro: Alert, awake, and oriented.  Speech clear.  Follows commands.  EOMs intact.  No facial asymmetry. Tongue midline. Slight left pronator drift present. Moves all extremities.   Imaging: CT Angio Head W or Wo Contrast  Result Date: 08/02/2019 CLINICAL DATA:  Confusion.  Abnormal head CT. EXAM: CT ANGIOGRAPHY HEAD AND NECK TECHNIQUE: Multidetector CT imaging of the head and neck was performed using the standard protocol during bolus administration of intravenous contrast. Multiplanar CT image reconstructions and MIPs were obtained to evaluate the vascular anatomy. Carotid stenosis measurements (when applicable) are obtained utilizing NASCET criteria, using the distal internal carotid diameter as the denominator. CONTRAST:  139m OMNIPAQUE IOHEXOL 350 MG/ML SOLN COMPARISON:  Head CT earlier same day FINDINGS: CTA NECK FINDINGS Aortic arch: Aortic atherosclerosis. No aneurysm or dissection. Branching pattern is normal without origin stenosis. Right carotid system: Common carotid artery widely patent to the bifurcation. Carotid bifurcation is normal without atherosclerotic disease or stenosis. Cervical ICA is widely patent. Left carotid system: Common  carotid artery widely patent to the bifurcation. Carotid bifurcation is normal. Cervical ICA is normal. Vertebral arteries: Both vertebral artery origins are widely patent. The left is dominant. Both vertebral arteries are patent through the cervical region to the foramen magnum. Skeleton: Ordinary cervical  spondylosis. Other neck: No mass or lymphadenopathy. Upper chest: Normal Review of the MIP images confirms the above findings CTA HEAD FINDINGS Anterior circulation: Both internal carotid arteries widely patent through the skull base and siphon regions. The anterior and middle cerebral vessels are patent proximally. There is occlusion of a proximal right M2 branch vessel. No other anterior circulation finding. No aneurysm or vascular malformation. Posterior circulation: Both vertebral arteries are patent through the foramen magnum to the basilar. Patent posterior communicating arteries on each side. No basilar stenosis. Superior cerebellar and posterior cerebral arteries show flow. Primary anterior circulation supply of the posterior cerebral arteries. Venous sinuses: Patent and normal. Anatomic variants: None significant. Review of the MIP images confirms the above findings IMPRESSION: Occlusion of a right proximal M2 branch. No other abnormal anterior circulation finding. Posterior circulation intracranial branch vessels are intact. No carotid bifurcation disease.  No vertebral artery origin disease. Minimal aortic atherosclerosis. These results were called by telephone at the time of interpretation on 08/02/2019 at 6:24 pm to Dr. Marcy Salvo verbally acknowledged these results. Electronically Signed   By: Nelson Chimes M.D.   On: 08/02/2019 18:28   CT Angio Neck W and/or Wo Contrast  Result Date: 08/02/2019 CLINICAL DATA:  Confusion.  Abnormal head CT. EXAM: CT ANGIOGRAPHY HEAD AND NECK TECHNIQUE: Multidetector CT imaging of the head and neck was performed using the standard protocol during bolus administration of intravenous contrast. Multiplanar CT image reconstructions and MIPs were obtained to evaluate the vascular anatomy. Carotid stenosis measurements (when applicable) are obtained utilizing NASCET criteria, using the distal internal carotid diameter as the denominator. CONTRAST:  142m OMNIPAQUE IOHEXOL 350  MG/ML SOLN COMPARISON:  Head CT earlier same day FINDINGS: CTA NECK FINDINGS Aortic arch: Aortic atherosclerosis. No aneurysm or dissection. Branching pattern is normal without origin stenosis. Right carotid system: Common carotid artery widely patent to the bifurcation. Carotid bifurcation is normal without atherosclerotic disease or stenosis. Cervical ICA is widely patent. Left carotid system: Common carotid artery widely patent to the bifurcation. Carotid bifurcation is normal. Cervical ICA is normal. Vertebral arteries: Both vertebral artery origins are widely patent. The left is dominant. Both vertebral arteries are patent through the cervical region to the foramen magnum. Skeleton: Ordinary cervical spondylosis. Other neck: No mass or lymphadenopathy. Upper chest: Normal Review of the MIP images confirms the above findings CTA HEAD FINDINGS Anterior circulation: Both internal carotid arteries widely patent through the skull base and siphon regions. The anterior and middle cerebral vessels are patent proximally. There is occlusion of a proximal right M2 branch vessel. No other anterior circulation finding. No aneurysm or vascular malformation. Posterior circulation: Both vertebral arteries are patent through the foramen magnum to the basilar. Patent posterior communicating arteries on each side. No basilar stenosis. Superior cerebellar and posterior cerebral arteries show flow. Primary anterior circulation supply of the posterior cerebral arteries. Venous sinuses: Patent and normal. Anatomic variants: None significant. Review of the MIP images confirms the above findings IMPRESSION: Occlusion of a right proximal M2 branch. No other abnormal anterior circulation finding. Posterior circulation intracranial branch vessels are intact. No carotid bifurcation disease.  No vertebral artery origin disease. Minimal aortic atherosclerosis. These results were called by telephone at the time of interpretation on  08/02/2019  at 6:24 pm to Dr. Marcy Salvo verbally acknowledged these results. Electronically Signed   By: Nelson Chimes M.D.   On: 08/02/2019 18:28   MR ANGIO HEAD WO CONTRAST  Result Date: 08/03/2019 CLINICAL DATA:  86 year old female code stroke presentation yesterday with right MCA M2 ELVO status post endovascular recanalization. History of atrial fibrillation. EXAM: MRI HEAD WITHOUT CONTRAST MRA HEAD WITHOUT CONTRAST TECHNIQUE: Multiplanar, multiecho pulse sequences of the brain and surrounding structures were obtained without intravenous contrast. Angiographic images of the head were obtained using MRA technique without contrast. COMPARISON:  CT head, CTA head and neck yesterday. FINDINGS: MRI HEAD FINDINGS Brain: Linear restricted diffusion from the posterior right corona radiata to the posterior right lentiform (series 7, image 54). Superimposed scattered, generally punctate foci of cortical and white matter restricted diffusion in the right hemisphere, including in the right occipital lobe (series 5, image 74). Furthermore, there are also occasional punctate foci of restricted diffusion in the left hemisphere (series 5, image 87) and also the right cerebellum. No hemorrhage or mass effect associated with the acute ischemia. 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 Minimal chronic microhemorrhage in the hemispheres. Moderate patchy bilateral cerebral white matter T2 and FLAIR hyperintensity. No restricted midline shift, mass effect, evidence of mass lesion, ventriculomegaly, extra-axial collection. Cervicomedullary junction and pituitary are within normal limits. Vascular: Major intracranial vascular flow voids are preserved. See MRA findings below. Skull and upper cervical spine: Negative visible cervical spine, bone marrow signal. Sinuses/Orbits: Negative orbits. Paranasal sinuses remain well pneumatized. Other: Mild right mastoid effusion. Negative visible nasopharynx. Scalp and face soft  tissues appear negative. MRA HEAD FINDINGS Antegrade flow in the posterior circulation with dominant left vertebral artery again noted. Patent PICA origins. No distal vertebral stenosis. Patent vertebrobasilar junction. Patent basilar artery without stenosis. Patent SCA origins. Fetal bilateral PCA origins. Bilateral PCA branches are within normal limits. Antegrade flow in both ICA siphons. No siphon stenosis. Normal ophthalmic and posterior communicating artery origins. Patent carotid termini, MCA and ACA origins. Tortuous right A1. Visible ACA branches are stable and within normal limits. Left MCA M1 and bifurcation remain patent without stenosis. Visible left MCA branches are stable and within normal limits. Right MCA M1 and bifurcation are patent without stenosis. The recently abnormal posterior right M2 branch remains recanalized (series 9, image 114 and series 1078 image 14). Visible right MCA branches now are within normal limits. IMPRESSION: 1. Positive for relatively widely scattered but generally punctate infarcts in the right hemisphere, the largest tracks from the posterior right corona radiata to the lentiform. Superimposed occasional punctate infarcts also in the left hemisphere and cerebellum. This pattern suggests recent embolic event in the setting of atrial fibrillation. No associated acute hemorrhage or mass effect. 2. Intracranial MRA is negative today, with normalized Right MCA M2 branches. Also note bilateral fetal PCA origins. 3. 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 Electronically Signed   By: Genevie Ann M.D.   On: 08/03/2019 11:36   MR BRAIN WO CONTRAST  Result Date: 08/03/2019 CLINICAL DATA:  86 year old female code stroke presentation yesterday with right MCA M2 ELVO status post endovascular recanalization. History of atrial fibrillation. EXAM: MRI HEAD WITHOUT CONTRAST MRA HEAD WITHOUT CONTRAST TECHNIQUE: Multiplanar, multiecho pulse sequences of the brain and  surrounding structures were obtained without intravenous contrast. Angiographic images of the head were obtained using MRA technique without contrast. COMPARISON:  CT head, CTA head and neck yesterday. FINDINGS: MRI HEAD FINDINGS Brain: Linear restricted diffusion from  the posterior right corona radiata to the posterior right lentiform (series 7, image 54). Superimposed scattered, generally punctate foci of cortical and white matter restricted diffusion in the right hemisphere, including in the right occipital lobe (series 5, image 74). Furthermore, there are also occasional punctate foci of restricted diffusion in the left hemisphere (series 5, image 87) and also the right cerebellum. No hemorrhage or mass effect associated with the acute ischemia. 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 Minimal chronic microhemorrhage in the hemispheres. Moderate patchy bilateral cerebral white matter T2 and FLAIR hyperintensity. No restricted midline shift, mass effect, evidence of mass lesion, ventriculomegaly, extra-axial collection. Cervicomedullary junction and pituitary are within normal limits. Vascular: Major intracranial vascular flow voids are preserved. See MRA findings below. Skull and upper cervical spine: Negative visible cervical spine, bone marrow signal. Sinuses/Orbits: Negative orbits. Paranasal sinuses remain well pneumatized. Other: Mild right mastoid effusion. Negative visible nasopharynx. Scalp and face soft tissues appear negative. MRA HEAD FINDINGS Antegrade flow in the posterior circulation with dominant left vertebral artery again noted. Patent PICA origins. No distal vertebral stenosis. Patent vertebrobasilar junction. Patent basilar artery without stenosis. Patent SCA origins. Fetal bilateral PCA origins. Bilateral PCA branches are within normal limits. Antegrade flow in both ICA siphons. No siphon stenosis. Normal ophthalmic and posterior communicating artery origins. Patent  carotid termini, MCA and ACA origins. Tortuous right A1. Visible ACA branches are stable and within normal limits. Left MCA M1 and bifurcation remain patent without stenosis. Visible left MCA branches are stable and within normal limits. Right MCA M1 and bifurcation are patent without stenosis. The recently abnormal posterior right M2 branch remains recanalized (series 9, image 114 and series 1078 image 14). Visible right MCA branches now are within normal limits. IMPRESSION: 1. Positive for relatively widely scattered but generally punctate infarcts in the right hemisphere, the largest tracks from the posterior right corona radiata to the lentiform. Superimposed occasional punctate infarcts also in the left hemisphere and cerebellum. This pattern suggests recent embolic event in the setting of atrial fibrillation. No associated acute hemorrhage or mass effect. 2. Intracranial MRA is negative today, with normalized Right MCA M2 branches. Also note bilateral fetal PCA origins. 3. 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 Electronically Signed   By: Genevie Ann M.D.   On: 08/03/2019 11:36   IR CT Head Ltd  Result Date: 08/03/2019 INDICATION: 86 year old female with past medical history significant for recent cerebellar stroke, atrial fibrillation, hypertension and GI cancer. She presented to an outside [name] with confusion and aphasia, NIHSS 5. Head CT showed no acute right MCA territory infarct with known left cerebellar stroke. CT angiogram of the head and neck showed a right M2/MCA occlusion. She was taken to our service for an emergency diagnostic cerebral angiogram and mechanical thrombectomy. EXAM: Diagnostic cerebral angiogram and mechanical thrombectomy. COMPARISON:  CT/CT angiogram of the head and neck August 02, 2019 MEDICATIONS: Ancef 2 gm IV. The antibiotic was administered within 1 hour of the procedure ANESTHESIA/SEDATION: The procedure was performed in the general anesthesia. FLUOROSCOPY  TIME:  Fluoroscopy Time: 11 minutes (360.9 mGy). COMPLICATIONS: None immediate. TECHNIQUE: Informed written consent was obtained from the patient's daughter after a thorough discussion of the procedural risks, benefits and alternatives. All questions were addressed. Maximal Sterile Barrier Technique was utilized including caps, mask, sterile gowns, sterile gloves, sterile drape, hand hygiene and skin antiseptic. A timeout was performed prior to the initiation of the procedure. Real-time ultrasound guidance was utilized for vascular access including  the acquisition of a permanent ultrasound image documenting patency of the accessed vessel. Using the modified Seldinger technique and a micropuncture kit, access was gained to the right radial artery at the wrist and a 7 French sheath was placed. Slow intra arterial infusion of 035 mcg nitroglicerin diluted in patient's own blood was performed. No significant fluctuation in patient's blood pressure seen. Then, a right radial artery roadmap was obtained via sheath side port. Normal brachial artery branching pattern seen. No significant anatomical variation. The right radial artery caliber is adequate for vascular access. Then, a 6 [place] Berenstein 2 catheter was navigated over a 0.035 inch a Terumo Glidewire into the right subclavian artery and then placed into the right common carotid artery. The wire was removed and a roadmap of the head and neck was obtained. Then, the catheter was exchanged over the wire for an infinity long sheath which was placed the cervical segment of the right ICA. Frontal and lateral angiograms of the head were obtained. FINDINGS: A subocclusive clot is seen within the proximal right M2/MCA posterior division branch with an occlusive clot seen in in the mid M2 segment extending to the M3. PROCEDURE: Under biplane roadmap, coaxial navigation of a Zoom 55 aspiration catheter over a synchro support microguidewire into the right M2/MCA posterior  division branch. The wire was removed and aspiration catheter was connected to an aspiration pump. Continuous aspiration was performed for 4 minutes. The aspiration catheter was then retracted under continuous aspiration. The guiding catheter was aspirated and brisk flow was noted. Right ICA angiogram was obtained with magnified frontal and lateral view of the head showing complete recanalization of the right M2/MCA (TICI3). Frontal and lateral angiograms with views of the entire head showed no evidence of thromboembolic complication. Flat panel CT of the head was obtained and post processed in a separate workstation with concurrent attending physician supervision. Selected images were sent to PACS. No hemorrhagic complication noted. Hyperdensity seen in the medial aspect of the left inferior cerebellar hemisphere, in the location of known stroke, likely representing contrast staining. The catheter was subsequently withdrawn. IMPRESSION: 1. Successful mechanical thrombectomy for treatment of a right M2/MCA posterior division branch occlusion via right radial approach. A total of 1 pass with direct contact aspiration with complete recanalization (TICI3). 2. No embolus to new or distal territory. 3. No hemorrhagic complication. PLAN: 1. ICU level of care. 2. SBP 120-140 mmHg. 3. Follow-up imaging within 24 hours. Electronically Signed   By: Pedro Earls M.D.   On: 08/03/2019 11:16   ECHOCARDIOGRAM COMPLETE  Result Date: 08/03/2019    ECHOCARDIOGRAM REPORT   Patient Name:   [name] Date of Exam: 08/03/2019 Medical Rec #:  [id number]      Height:       63.0 in Accession #:    [id number]     Weight:       161.2 lb Date of Birth:  1932-03-21       BSA:          1.764 m Patient Age:    72 years       BP:           144/59 mmHg Patient Gender: F              HR:           77 bpm. Exam Location:  Inpatient Procedure: 2D Echo, 3D Echo, Color Doppler and Cardiac Doppler Indications:    Stroke i163.9   History:  Patient has no prior history of Echocardiogram examinations.                 Arrythmias:Atrial Fibrillation; Risk Factors:Hypertension and                 Dyslipidemia. Prior performed at [facility]  Sonographer:    Raquel Sarna Senior RDCS Referring Phys: [place]  1. Left ventricular ejection fraction, by estimation, is 60 to 65%. The left ventricle has normal function. The left ventricle has no regional wall motion abnormalities. There is mild concentric left ventricular hypertrophy. Left ventricular diastolic parameters are consistent with Grade II diastolic dysfunction (pseudonormalization). Elevated left atrial pressure.  2. Right ventricular systolic function is normal. The right ventricular size is normal. There is mildly elevated pulmonary artery systolic pressure. The estimated right ventricular systolic pressure is 38.3 mmHg.  3. Left atrial size was mildly dilated.  4. The mitral valve is normal in structure. No evidence of mitral valve regurgitation. No evidence of mitral stenosis.  5. The aortic valve is normal in structure. Aortic valve regurgitation is not visualized. No aortic stenosis is present.  6. The inferior vena cava is normal in size with greater than 50% respiratory variability, suggesting right atrial pressure of 3 mmHg. Comparison(s): Prior images unable to be directly viewed, comparison made by report only. Prior echo at [facility] 07/25/2019 showed similar findings. FINDINGS  Left Ventricle: Left ventricular ejection fraction, by estimation, is 60 to 65%. The left ventricle has normal function. The left ventricle has no regional wall motion abnormalities. The left ventricular internal cavity size was normal in size. There is  mild concentric left ventricular hypertrophy. Left ventricular diastolic parameters are consistent with Grade II diastolic dysfunction (pseudonormalization). Elevated left atrial pressure. Right Ventricle: The right ventricular size is normal. No  increase in right ventricular wall thickness. Right ventricular systolic function is normal. There is mildly elevated pulmonary artery systolic pressure. The tricuspid regurgitant velocity is 2.95  m/s, and with an assumed right atrial pressure of 3 mmHg, the estimated right ventricular systolic pressure is 33.8 mmHg. Left Atrium: Left atrial size was mildly dilated. Right Atrium: Right atrial size was normal in size. Pericardium: There is no evidence of pericardial effusion. Mitral Valve: The mitral valve is normal in structure. Normal mobility of the mitral valve leaflets. No evidence of mitral valve regurgitation. No evidence of mitral valve stenosis. MV peak gradient, 9.1 mmHg. The mean mitral valve gradient is 3.0 mmHg. Tricuspid Valve: The tricuspid valve is normal in structure. Tricuspid valve regurgitation is trivial. No evidence of tricuspid stenosis. Aortic Valve: The aortic valve is normal in structure. Aortic valve regurgitation is not visualized. No aortic stenosis is present. Pulmonic Valve: The pulmonic valve was normal in structure. Pulmonic valve regurgitation is not visualized. No evidence of pulmonic stenosis. Aorta: The aortic root is normal in size and structure. Venous: The inferior vena cava is normal in size with greater than 50% respiratory variability, suggesting right atrial pressure of 3 mmHg. IAS/Shunts: No atrial level shunt detected by color flow Doppler.  LEFT VENTRICLE PLAX 2D LVIDd:         3.60 cm  Diastology LVIDs:         2.20 cm  LV e' lateral:   7.72 cm/s LV PW:         1.30 cm  LV E/e' lateral: 16.7 LV IVS:        1.40 cm  LV e' medial:    6.74 cm/s LVOT diam:  2.00 cm  LV E/e' medial:  19.1 LV SV:         90 LV SV Index:   51 LVOT Area:     3.14 cm  RIGHT VENTRICLE RV S prime:     11.60 cm/s TAPSE (M-mode): 2.0 cm LEFT ATRIUM             Index       RIGHT ATRIUM           Index LA diam:        3.90 cm 2.21 cm/m  RA Area:     13.20 cm LA Vol (A2C):   63.9 ml 36.22 ml/m  RA Volume:   24.80 ml  14.06 ml/m LA Vol (A4C):   51.5 ml 29.19 ml/m LA Biplane Vol: 59.8 ml 33.90 ml/m  AORTIC VALVE LVOT Vmax:   135.00 cm/s LVOT Vmean:  93.300 cm/s LVOT VTI:    0.288 m  AORTA Ao Root diam: 3.00 cm Ao Asc diam:  3.20 cm MITRAL VALVE                TRICUSPID VALVE MV Area (PHT): 3.37 cm     TR Peak grad:   34.8 mmHg MV Peak grad:  9.1 mmHg     TR Vmax:        295.00 cm/s MV Mean grad:  3.0 mmHg MV Vmax:       1.51 m/s     SHUNTS MV Vmean:      77.4 cm/s    Systemic VTI:  0.29 m MV Decel Time: 225 msec     Systemic Diam: 2.00 cm MV E velocity: 129.00 cm/s MV A velocity: 42.00 cm/s MV E/A ratio:  3.07 Mihai Croitoru MD Electronically signed by Sanda Klein MD Signature Date/Time: 08/03/2019/12:16:18 PM    Final    IR PERCUTANEOUS ART THROMBECTOMY/INFUSION INTRACRANIAL INC DIAG ANGIO  Result Date: 08/03/2019 INDICATION: 86 year old female with past medical history significant for recent cerebellar stroke, atrial fibrillation, hypertension and GI cancer. She presented to an outside [name] with confusion and aphasia, NIHSS 5. Head CT showed no acute right MCA territory infarct with known left cerebellar stroke. CT angiogram of the head and neck showed a right M2/MCA occlusion. She was taken to our service for an emergency diagnostic cerebral angiogram and mechanical thrombectomy. EXAM: Diagnostic cerebral angiogram and mechanical thrombectomy. COMPARISON:  CT/CT angiogram of the head and neck August 02, 2019 MEDICATIONS: Ancef 2 gm IV. The antibiotic was administered within 1 hour of the procedure ANESTHESIA/SEDATION: The procedure was performed in the general anesthesia. FLUOROSCOPY TIME:  Fluoroscopy Time: 11 minutes (360.9 mGy). COMPLICATIONS: None immediate. TECHNIQUE: Informed written consent was obtained from the patient's daughter after a thorough discussion of the procedural risks, benefits and alternatives. All questions were addressed. Maximal Sterile Barrier Technique was utilized  including caps, mask, sterile gowns, sterile gloves, sterile drape, hand hygiene and skin antiseptic. A timeout was performed prior to the initiation of the procedure. Real-time ultrasound guidance was utilized for vascular access including the acquisition of a permanent ultrasound image documenting patency of the accessed vessel. Using the modified Seldinger technique and a micropuncture kit, access was gained to the right radial artery at the wrist and a 7 French sheath was placed. Slow intra arterial infusion of 725 mcg nitroglicerin diluted in patient's own blood was performed. No significant fluctuation in patient's blood pressure seen. Then, a right radial artery roadmap was obtained via sheath side port. Normal brachial artery branching pattern  seen. No significant anatomical variation. The right radial artery caliber is adequate for vascular access. Then, a 6 [place] Berenstein 2 catheter was navigated over a 0.035 inch a Terumo Glidewire into the right subclavian artery and then placed into the right common carotid artery. The wire was removed and a roadmap of the head and neck was obtained. Then, the catheter was exchanged over the wire for an infinity long sheath which was placed the cervical segment of the right ICA. Frontal and lateral angiograms of the head were obtained. FINDINGS: A subocclusive clot is seen within the proximal right M2/MCA posterior division branch with an occlusive clot seen in in the mid M2 segment extending to the M3. PROCEDURE: Under biplane roadmap, coaxial navigation of a Zoom 55 aspiration catheter over a synchro support microguidewire into the right M2/MCA posterior division branch. The wire was removed and aspiration catheter was connected to an aspiration pump. Continuous aspiration was performed for 4 minutes. The aspiration catheter was then retracted under continuous aspiration. The guiding catheter was aspirated and brisk flow was noted. Right ICA angiogram was obtained  with magnified frontal and lateral view of the head showing complete recanalization of the right M2/MCA (TICI3). Frontal and lateral angiograms with views of the entire head showed no evidence of thromboembolic complication. Flat panel CT of the head was obtained and post processed in a separate workstation with concurrent attending physician supervision. Selected images were sent to PACS. No hemorrhagic complication noted. Hyperdensity seen in the medial aspect of the left inferior cerebellar hemisphere, in the location of known stroke, likely representing contrast staining. The catheter was subsequently withdrawn. IMPRESSION: 1. Successful mechanical thrombectomy for treatment of a right M2/MCA posterior division branch occlusion via right radial approach. A total of 1 pass with direct contact aspiration with complete recanalization (TICI3). 2. No embolus to new or distal territory. 3. No hemorrhagic complication. PLAN: 1. ICU level of care. 2. SBP 120-140 mmHg. 3. Follow-up imaging within 24 hours. Electronically Signed   By: Pedro Earls M.D.   On: 08/03/2019 11:16   CT HEAD CODE STROKE WO CONTRAST  Result Date: 08/02/2019 CLINICAL DATA:  Code stroke. Worsening confusion. Recent stroke treated at outside institution. EXAM: CT HEAD WITHOUT CONTRAST TECHNIQUE: Contiguous axial images were obtained from the base of the skull through the vertex without intravenous contrast. COMPARISON:  None. FINDINGS: Brain: Age related volume loss. Chronic small-vessel ischemic changes of the cerebral hemispheric white matter. Probable low-density in the left cerebellum that could be an acute or subacute infarction. No sign of hemorrhage, mass, hydrocephalus or extra-axial collection. Vascular: There is atherosclerotic calcification of the major vessels at the base of the brain. Skull: Negative Sinuses/Orbits: Clear/normal Other: None ASPECTS ([place] Stroke Program Early CT Score) - Ganglionic level  infarction (caudate, lentiform nuclei, internal capsule, insula, M1-M3 cortex): 7 - Supraganglionic infarction (M4-M6 cortex): 3 Total score (0-10 with 10 being normal): 10 IMPRESSION: 1. Chronic small-vessel ischemic changes of the cerebral hemispheric white matter. Probable acute or subacute infarction in the left cerebellar hemisphere. No mass effect or hemorrhage. 2. These results were called by telephone at the time of interpretation on 08/02/2019 at 4:17 pm to provider Dr. Melina Copa, who verbally acknowledged these results. 3. ASPECTS is Electronically Signed   By: Nelson Chimes M.D.   On: 08/02/2019 16:19    Labs:  CBC: Recent Labs    08/02/19 1640 08/02/19 1730 08/03/19 1031  WBC  --  6.7 8.9  HGB 12.6 12.3 12.9  HCT 37.0 38.7 39.6  PLT  --  249 282    COAGS: Recent Labs    08/02/19 1730  INR 1.0  APTT 30    BMP: Recent Labs    08/02/19 1602 08/02/19 1640 08/03/19 1031  NA 141 142 140  K 4.2 3.8 4.0  CL 104 101 108  CO2 26  --  20*  GLUCOSE 126* 120* 143*  BUN 9 8 7*  CALCIUM 9.8  --  9.4  CREATININE 1.20* 1.20* 1.10*  GFRNONAA 41*  --  46*  GFRAA 48*  --  53*    LIVER FUNCTION TESTS: Recent Labs    08/02/19 1602  BILITOT 0.5  AST 28  ALT 18  ALKPHOS 85  PROT 7.4  ALBUMIN 4.1    Assessment and Plan: Mid right M2/MCA posterior division branch occlusion. Mechanical thrombectomy performed with direct contact aspiration. Total one pass, TICI3 achieved Patient assessed this AM. She is recovering well.  She does have a new slight left pronator drift.  Imaging this AM shows widely scattered, but punctate infarcts in the right hemisphere.  Right radial procedure site intact.  Daughter updated at bedside by Dr. Karenann Cai. NIR available if needed.   Electronically Signed: Docia Barrier, PA 08/03/2019, 2:56 PM   I spent a total of 15 Minutes at the the patient's bedside AND on the patient's [name] floor or unit, greater than 50% of  which was counseling/coordinating care for R MCA occlusion.

## 2019-08-03 NOTE — Evaluation (Signed)
Speech Language Pathology Evaluation Patient Details Name: Jodi [name] MRN: [id number] DOB: [birth year] Today's Date: 08/03/2019 Time: 1455-1510 SLP Time Calculation (min) (ACUTE ONLY): 15 min  Problem List:  Patient Active Problem List   Diagnosis Date Noted  . Stroke ([place]) 08/02/2019  . Stroke (cerebrum) ([place]) 08/02/2019   Past Medical History:  Past Medical History:  Diagnosis Date  . Atrial fibrillation ([place])   . CAD (coronary artery disease)   . CKD (chronic kidney disease) stage 3, GFR 30-59 ml/min   . CVA (cerebral vascular accident) ([place])   . HLD (hyperlipidemia)   . HTN (hypertension)   . 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[facility])    Past Surgical History:  Past Surgical History:  Procedure Laterality Date  . IR CT HEAD LTD  08/02/2019  . IR PERCUTANEOUS ART THROMBECTOMY/INFUSION INTRACRANIAL INC DIAG ANGIO  08/02/2019      . IR PERCUTANEOUS ART THROMBECTOMY/INFUSION INTRACRANIAL INC DIAG ANGIO  08/02/2019  . RADIOLOGY WITH ANESTHESIA N/A 08/02/2019   Procedure: IR WITH ANESTHESIA;  Surgeon: Luanne Bras, MD;  Location: [facility];  Service: Radiology;  Laterality: N/A;   HPI:    Jodi [name] is an 86 y.o. female recent cerebellar stroke on June 6, discharged from [facility] on June 11, started on Plavix and Lipitor, who presented as code stroke to the [facility] ED. CT head showed no acute hypodensity or hemorrhage. Transferred to [facility] for mechanical thrombectomy.     Assessment / Plan / Recommendation Clinical Impression   Pt presents with mild cognitive deficits characterized by decreased storage and retrieval of information, decreased functional problem solving, and decreased emergent awareness of deficits.  Speech is fluent and free from dysarthria or word finding impairment.  As a result, pt would benefit from skilled ST while inpatient in order to maximize functional independence and reduce burden of care prior to discharge.      SLP Assessment  SLP  Recommendation/Assessment: Patient needs continued Speech Lanaguage Pathology Services SLP Visit Diagnosis: Cognitive communication deficit (R41.841)    Follow Up Recommendations   (To be determined pending PT/OT evals)    Frequency and Duration min 1 x/week         SLP Evaluation Cognition  Overall Cognitive Status: Impaired/Different from baseline Arousal/Alertness: Awake/alert Orientation Level: Oriented X4 Attention: Sustained Sustained Attention: Appears intact Memory: Impaired Memory Impairment: Retrieval deficit;Decreased recall of new information Awareness: Impaired Awareness Impairment: Emergent impairment Problem Solving: Impaired Problem Solving Impairment: Functional basic Safety/Judgment: Impaired       Comprehension  Auditory Comprehension Overall Auditory Comprehension: Appears within functional limits for tasks assessed    Expression Expression Primary Mode of Expression: Verbal Verbal Expression Overall Verbal Expression: Appears within functional limits for tasks assessed   Oral / Motor  Oral Motor/Sensory Function Overall Oral Motor/Sensory Function: Within functional limits Motor Speech Overall Motor Speech: Appears within functional limits for tasks assessed   GO                    Emilio Math 08/03/2019, 3:23 PM

## 2019-08-03 NOTE — Evaluation (Signed)
Physical Therapy Evaluation Patient Details Name: Jodi [name] MRN: [id number] DOB: 1932/05/01 Today's Date: 08/03/2019   History of Present Illness  86 y.o. female with a recent cerebellar stroke on June 6, discharged from [facility] on June 11, started on Plavix and Lipitor, who presented as code stroke to the [facility] ED this afternoon. She had reportedly been acting normal all day with her daughter and then became confused, using inappropriate words and not acting normally. CTA head and neck revealed R M2 occlusion. Pt underwent cerebral angiogram and thrombectomy on 6/17.  Clinical Impression  Pt presents to PT with deficits in coordination, cognition, balance, strength, power, gait, and functional mobility. Pt with apraxic movements of LUE noted during coordination testing and likely impaired proprioception of LLE, leaving the extremity behind when attempting to mobilize toward the edge of bed. Pt requires UE support to steady during standing activity, and still requires minA to reduce falls risk during OOB activity. Pt's falls risk also exacerbated by chronic L visual field deficits 2/2 cataracts. Pt will benefit from continued acute PT POC and aggressive mobilization with staff to improve balance and restore independence in mobility. PT recommends CIR at this time as the pt was independent prior to admission and demonstrates the potential to return to independent mobility.    Follow Up Recommendations CIR    Equipment Recommendations  3in1 (PT)    Recommendations for Other Services Rehab consult     Precautions / Restrictions Precautions Precautions: Fall Restrictions Weight Bearing Restrictions: No      Mobility  Bed Mobility Overal bed mobility: Needs Assistance Bed Mobility: Supine to Sit     Supine to sit: Min assist     General bed mobility comments: pt requires cues to mobilize LLE to edge of bed, initially leaving it behind when attempting to sit up to side of  bed  Transfers Overall transfer level: Needs assistance Equipment used: Rolling walker (2 wheeled) Transfers: Sit to/from [facility] Sit to Stand: Min assist Stand pivot transfers: Min assist       General transfer comment: pt initially without device, some improvement with use of RW to power up to standing  Ambulation/Gait Ambulation/Gait assistance: Mod assist;Min assist Gait Distance (Feet): 8 Feet Assistive device: Rolling walker (2 wheeled);1 person hand held assist Gait Pattern/deviations: Step-to pattern;Shuffle Gait velocity: reduced Gait velocity interpretation: <1.8 ft/sec, indicate of risk for recurrent falls General Gait Details: pt with short shuffling steps with hand hold of PT, improved foot clearance and stability with BUE support of RW  Stairs            Wheelchair Mobility    Modified Rankin (Stroke Patients Only) Modified Rankin (Stroke Patients Only) Pre-Morbid Rankin Score: No symptoms Modified Rankin: Moderately severe disability     Balance Overall balance assessment: Needs assistance Sitting-balance support: No upper extremity supported;Feet supported Sitting balance-Leahy Scale: Poor Sitting balance - Comments: minA due to posterior lean Postural control: Posterior lean Standing balance support: Bilateral upper extremity supported Standing balance-Leahy Scale: Fair Standing balance comment: minG with BUE support of RW                             Pertinent Vitals/Pain Pain Assessment: No/denies pain    Home Living Family/patient expects to be discharged to:: Private residence Living Arrangements: Alone Available Help at Discharge: Family Type of Home: House Home Access: Stairs to enter Entrance Stairs-Rails:  (unure) Entrance Stairs-Number of Steps:  3 Home Layout: One level Home Equipment: Walker - 2 wheels;Wheelchair - manual Additional Comments: RN does report after session that the patient's daughter  was going to sign an apartment lease today so the patient and daughter could live together    Prior Function Level of Independence: Independent         Comments: pt reports that she had progressed to ambulating without an assistive device since most recent stroke     Hand Dominance        Extremity/Trunk Assessment   Upper Extremity Assessment Upper Extremity Assessment: Defer to OT evaluation    Lower Extremity Assessment Lower Extremity Assessment: Generalized weakness    Cervical / Trunk Assessment Cervical / Trunk Assessment: Normal  Communication   Communication: No difficulties  Cognition Arousal/Alertness: Awake/alert Behavior During Therapy: WFL for tasks assessed/performed Overall Cognitive Status: Impaired/Different from baseline Area of Impairment: Problem solving                             Problem Solving: Slow processing;Difficulty sequencing        General Comments General comments (skin integrity, edema, etc.): VSS on RA. Pt with some dysmetria noted in LUE. Pt also reports L vision deficits chronically due to cataracts    Exercises     Assessment/Plan    PT Assessment Patient needs continued PT services  PT Problem List Decreased strength;Decreased activity tolerance;Decreased balance;Decreased mobility;Decreased coordination;Decreased cognition;Decreased safety awareness;Decreased knowledge of use of DME;Decreased knowledge of precautions       PT Treatment Interventions DME instruction;Gait training;Stair training;Functional mobility training;Therapeutic activities;Therapeutic exercise;Balance training;Neuromuscular re-education;Patient/family education    PT Goals (Current goals can be found in the Care Plan section)  Acute Rehab PT Goals Patient Stated Goal: To return to independent mobility PT Goal Formulation: With patient Time For Goal Achievement: 08/17/19 Potential to Achieve Goals: Good    Frequency Min 4X/week    Barriers to discharge        Co-evaluation               AM-PAC PT "6 Clicks" Mobility  Outcome Measure Help needed turning from your back to your side while in a flat bed without using bedrails?: A Little Help needed moving from lying on your back to sitting on the side of a flat bed without using bedrails?: A Little Help needed moving to and from a bed to a chair (including a wheelchair)?: A Little Help needed standing up from a chair using your arms (e.g., wheelchair or bedside chair)?: A Little Help needed to walk in hospital room?: A Little Help needed climbing 3-5 steps with a railing? : Total 6 Click Score: 16    End of Session   Activity Tolerance: Patient tolerated treatment well Patient left: in chair;with call bell/phone within reach;with chair alarm set Nurse Communication: Mobility status PT Visit Diagnosis: Unsteadiness on feet (R26.81);Other abnormalities of gait and mobility (R26.89);Muscle weakness (generalized) (M62.81);Other symptoms and signs involving the nervous system (R29.898)    Time: 6144-3154 PT Time Calculation (min) (ACUTE ONLY): 19 min   Charges:   PT Evaluation $PT Eval Moderate Complexity: 1 Mod          Zenaida Niece, PT, DPT Acute Rehabilitation Pager: [phone]   Zenaida Niece 08/03/2019, 3:59 PM

## 2019-08-04 LAB — CBC
HCT: 36.9 % (ref 36.0–46.0)
Hemoglobin: 11.7 g/dL — ABNORMAL LOW (ref 12.0–15.0)
MCH: 28.6 pg (ref 26.0–34.0)
MCHC: 31.7 g/dL (ref 30.0–36.0)
MCV: 90.2 fL (ref 80.0–100.0)
Platelets: 247 10*3/uL (ref 150–400)
RBC: 4.09 MIL/uL (ref 3.87–5.11)
RDW: 14.1 % (ref 11.5–15.5)
WBC: 11 10*3/uL — ABNORMAL HIGH (ref 4.0–10.5)
nRBC: 0 % (ref 0.0–0.2)

## 2019-08-04 LAB — BASIC METABOLIC PANEL
Anion gap: 9 (ref 5–15)
BUN: 11 mg/dL (ref 8–23)
CO2: 23 mmol/L (ref 22–32)
Calcium: 9.2 mg/dL (ref 8.9–10.3)
Chloride: 108 mmol/L (ref 98–111)
Creatinine, Ser: 1.26 mg/dL — ABNORMAL HIGH (ref 0.44–1.00)
GFR calc Af Amer: 45 mL/min — ABNORMAL LOW (ref >60)
GFR calc non Af Amer: 39 mL/min — ABNORMAL LOW (ref >60)
Glucose, Bld: 97 mg/dL (ref 70–99)
Potassium: 3.9 mmol/L (ref 3.5–5.1)
Sodium: 140 mmol/L (ref 135–145)

## 2019-08-04 MED ORDER — POLYETHYLENE GLYCOL 3350 17 G PO PACK
17.0000 g | PACK | Freq: Every day | ORAL | Status: DC
  Administered 2019-08-04 – 2019-08-07 (×4): 17 g via ORAL
  Filled 2019-08-04 (×4): qty 1

## 2019-08-04 NOTE — Progress Notes (Signed)
STROKE TEAM PROGRESS NOTE   INTERVAL HISTORY She reports doing well today after mechanical thrombectomy.   OBJECTIVE Vitals:   08/04/19 0200 08/04/19 0300 08/04/19 0400 08/04/19 0500  BP: (!) 144/69 130/69 138/69 (!) 156/54  Pulse: 64 67 68 72  Resp: 15 13 12 12   Temp:   98.1 F (36.7 C)   TempSrc:   Oral   SpO2: 98% 96% 97% 95%  Weight:        CBC:  Recent Labs  Lab 08/02/19 1730 08/02/19 1730 08/03/19 1031 08/04/19 0602  WBC 6.7   < > 8.9 11.0*  NEUTROABS 4.4  --   --   --   HGB 12.3   < > 12.9 11.7*  HCT 38.7   < > 39.6 36.9  MCV 90.0   < > 89.8 90.2  PLT 249   < > 282 247   < > = values in this interval not displayed.    Basic Metabolic Panel:  Recent Labs  Lab 08/03/19 1031 08/04/19 0602  NA 140 140  K 4.0 3.9  CL 108 108  CO2 20* 23  GLUCOSE 143* 97  BUN 7* 11  CREATININE 1.10* 1.26*  CALCIUM 9.4 9.2    Lipid Panel:     Component Value Date/Time   CHOL 144 08/03/2019 0618   TRIG 53 08/03/2019 0618   HDL 78 08/03/2019 0618   CHOLHDL 1.8 08/03/2019 0618   VLDL 11 08/03/2019 0618   LDLCALC 55 08/03/2019 0618   HgbA1c: No results found for: HGBA1C Urine Drug Screen: No results found for: LABOPIA, COCAINSCRNUR, LABBENZ, AMPHETMU, THCU, LABBARB  Alcohol Level No results found for: ETH  IMAGING  CT Angio Head W or Wo Contrast CT Angio Neck W and/or Wo Contrast 08/02/2019 IMPRESSION:  Occlusion of a right proximal M2 branch. No other abnormal anterior circulation finding. Posterior circulation intracranial branch vessels are intact. No carotid bifurcation disease.  No vertebral artery origin disease. Minimal aortic atherosclerosis.   CT HEAD CODE STROKE WO CONTRAST 08/02/2019 IMPRESSION:  Chronic small-vessel ischemic changes of the cerebral hemispheric white matter. Probable acute or subacute infarction in the left cerebellar hemisphere. No mass effect or hemorrhage.   Neuro Interventional Radiology - Cerebral Angiogram with Intervention - Dr.  Roylene Reason Sindy Messing 08/02/19 Mid right M2/MCA posterior division branch occlusion. Mechanical thrombectomy performed with direct contact aspiration. Total one pass, TICI3.  Transthoracic Echocardiogram  1. Left ventricular ejection fraction, by estimation, is 60 to 65%. The  left ventricle has normal function. The left ventricle has no regional  wall motion abnormalities. There is mild concentric left ventricular  hypertrophy. Left ventricular diastolic  parameters are consistent with Grade II diastolic dysfunction  (pseudonormalization). Elevated left atrial pressure.  2. Right ventricular systolic function is normal. The right ventricular  size is normal. There is mildly elevated pulmonary artery systolic  pressure. The estimated right ventricular systolic pressure is 65.0 mmHg.  3. Left atrial size was mildly dilated.  4. The mitral valve is normal in structure. No evidence of mitral valve  regurgitation. No evidence of mitral stenosis.  5. The aortic valve is normal in structure. Aortic valve regurgitation is  not visualized. No aortic stenosis is present.  6. The inferior vena cava is normal in size with greater than 50%  respiratory variability, suggesting right atrial pressure of 3 mmHg.   ECG - Sinus or ectopic atrial rhythym rate 63 BPM. (See cardiology reading for complete details)   PHYSICAL EXAM  Temp:  [97.8 F (36.6 C)-99.7 F (37.6 C)] 98.1 F (36.7 C) (06/19 0400) Pulse Rate:  [52-87] 72 (06/19 0500) Resp:  [12-28] 12 (06/19 0500) BP: (99-169)/(46-103) 156/54 (06/19 0500) SpO2:  [93 %-98 %] 95 % (06/19 0500)  General - Well nourished, well developed, in no apparent distress.  Ophthalmologic - fundi not visualized due to noncooperation.  Cardiovascular - Regular rhythm and rate, not in afib or aflutter.  Mental Status -  Level of arousal and orientation to time, place, and person were intact. Language including expression, repetition,  comprehension was assessed and found intact.   Cranial Nerves II - XII - II - Visual field intact OU. III, IV, VI - Extraocular movements intact. V - Facial sensation intact bilaterally. VII - left mild facial nasolabial fold flatterning. VIII - Hearing & vestibular intact bilaterally. X - Palate elevates symmetrically. XI - Chin turning & shoulder shrug intact bilaterally XII - Tongue protrusion intact.  Motor Strength - The patient's strength was normal in RUE and RLE, left side seems normal today with 5/5 in the upper or lower extremities with bulk was normal and fasciculations were absent.   Motor Tone - Muscle tone was assessed at the neck and appendages and was normal.  Reflexes - The patient's reflexes were symmetrical in all extremities and she had no pathological reflexes.  Sensory - Light touch, temperature/pinprick were assessed and were symmetrical subjectively.    Coordination - The patient had normal movements in the right hand with no ataxia or dysmetria.  No dysmetria noted today in the left upper extremity.  Tremor was absent.  Gait and Station - deferred.   ASSESSMENT/PLAN [name] is a 86 y.o. female with history of atrial fibrillation (Plavix PTA), CAD, CKD, Hld, Htn, stomach cancer, aspirin allergy, recent cerebellar stroke on June 6 [facility]), presenting with confusion and speech difficulties. She did not receive IV t-PA due to recent stroke Mechanical thrombectomy mid right M2/MCA posterior division branch occlusion. Total one pass, TICI3.  Stroke: right MCA small/punctate scattered infarcts with punctate left MCA/ACA and right cerebellum infarcts with right M2 occlusion s/p TICI3 reperfusion - embolic pattern due to paroxysmal AF or Aflutter  CT Head - subacute infarction in the left cerebellar hemisphere. No mass effect or hemorrhage.   CTA H&N - Occlusion of a right proximal M2 branch.    IR - thrombectomy with TICI3 revascularization  MRI head -  right MCA small/punctate scattered infarcts with punctate left MCA/ACA and right cerebellum infarcts  MRA head - right MCA M2 patent  2D Echo EF 60 to 65%  [employer] Virus 2 - negative  LDL 55  HgbA1c - still pending  VTE prophylaxis - SCDs  clopidogrel 75 mg daily prior to admission, now on Plavix 75.  Will switch to Eliquis Saturday for stroke prevention given current small size infarcts.  Will start Eliquis tomorrow.  Plavix will be discontinued.  Patient counseled to be compliant with her antithrombotic medications  Ongoing aggressive stroke risk factor management  Therapy recommendations: CIR  Disposition:  Pending  Hx of stroke  6/6-6/11 dizziness - admission to [facility] for left cerebellar infarct.  CTA head and neck negative.  Put on Plavix and statin on discharge.  Recommend heart monitor for 30 days as outpatient.  Heart monitor on hold during current admission  This time MRI showed subacute left cerebellum infarct with petechial hemorrhage  Paroxysmal a flutter versus A. Fib  Paroxysmal a flutter in 2013 status post cardioversion  Paroxysmal a flutter in 2014 status post cardioversion  As per [facility] note, patient was on Xarelto but stopped due to GI bleeding  Per patient and daughter, patient was not on Xarelto before and had no GI bleeding.  Most likely the cause of recent recurrent embolic strokes  Eliquis started 6/19 per pharmacy monitoring  Hypertension  Home BP meds: Lopressor ; Calan ; Apresoline ; Zestril  Current BP meds: metoprolo; verapamil  On cleviprex  Stable . BP goal 120-140 within 24h of procedure, then relax to < 180 . Long-term BP goal normotensive  Hyperlipidemia  Home Lipid lowering medication: Lipitor 40 mg daily  LDL 55, goal < 70  Current lipid lowering medication: Lipitor 40 mg daily  Continue statin at discharge  Other Stroke Risk Factors  Advanced age  Coronary artery disease  Other Active Problems  Code  status - Full Code  Aspirin allergy - anaphylaxis  CKD - stage 3b - creatinine - 1.20->1.10->1.26  Leukocytosis - 8.9->11.0 (temp 98.1)  Hospital day # 2  Consider transfer to stepdown depending on how patient does.  She also be evaluated for possible inpatient rehab.    To contact Stroke Continuity provider, please refer to [email or web address] After hours, contact General Neurology

## 2019-08-04 NOTE — Progress Notes (Signed)
Patient arrived to unit, verified on telemetry. Belongings at bedside- dentures upper &lower

## 2019-08-04 NOTE — Progress Notes (Signed)
LATE ENTRY:   At shift change approximately 0730 patient noted with new left arm drift.  Xu MD notified.  MRA added to MRI, changed order to now. SBP stable in 130s with cleviprex on.  MD at bedside upon returning from MRI. Reassessed [employer] and diet placed.

## 2019-08-04 NOTE — Plan of Care (Signed)
  Problem: Education: Goal: Knowledge of disease or condition will improve Outcome: Progressing   

## 2019-08-04 NOTE — Progress Notes (Signed)
Physical Therapy Treatment Patient Details Name: Jodi [name] MRN: [id number] DOB: 27-Aug-1932 Today's Date: 08/04/2019    History of Present Illness 86 y.o. female with a recent cerebellar stroke on June 6, discharged from [facility] on June 11, started on Plavix and Lipitor, who presented as code stroke to the [facility] ED this afternoon. She had reportedly been acting normal all day with her daughter and then became confused, using inappropriate words and not acting normally. CTA head and neck revealed R M2 occlusion. Pt underwent cerebral angiogram and thrombectomy on 6/17.    PT Comments    Pt tolerated treatment well with significant improvement in activity tolerance, gait and mobility quality, balance, and coordination. Pt demonstrates the ability to ambulate for limited community distances with UE support of the RW, no significant balance deviations noted. Pt demonstrating improved dynamic balance, reaching widely outside of her base of support In standing. Pt also demonstrates much improved command following and coordination of UE movements. Pt will benefit from continued acute PT POC to improve mobility and restore her prior level of function.   Follow Up Recommendations  Home health PT;Supervision for mobility/OOB     Equipment Recommendations  None recommended by PT    Recommendations for Other Services       Precautions / Restrictions Precautions Precautions: Fall Restrictions Weight Bearing Restrictions: No    Mobility  Bed Mobility Overal bed mobility: Needs Assistance Bed Mobility: Supine to Sit     Supine to sit: Supervision        Transfers Overall transfer level: Needs assistance Equipment used: Rolling walker (2 wheeled) Transfers: Sit to/from Stand Sit to Stand: Supervision            Ambulation/Gait Ambulation/Gait assistance: Min guard;Supervision (minG progressing to close supervision) Gait Distance (Feet): 200 Feet Assistive device:  Rolling walker (2 wheeled) Gait Pattern/deviations: Step-through pattern;Decreased stride length Gait velocity: reduced Gait velocity interpretation: <1.8 ft/sec, indicate of risk for recurrent falls General Gait Details: pt with shortened step through gait, pt is able to perform head turns, change gait speed, stop abruptly without noticeablle LOB. Pt does ambulate with reduced gait speed when not requested to ambulate at a certain speed   Stairs             Wheelchair Mobility    Modified Rankin (Stroke Patients Only) Modified Rankin (Stroke Patients Only) Pre-Morbid Rankin Score: No symptoms Modified Rankin: Moderately severe disability     Balance Overall balance assessment: Needs assistance Sitting-balance support: No upper extremity supported;Feet supported Sitting balance-Leahy Scale: Good Sitting balance - Comments: supervision at the edge of bed   Standing balance support: Single extremity supported;During functional activity Standing balance-Leahy Scale: Good Standing balance comment: close supervision reaching >10 inches outside of her BOS                            Cognition Arousal/Alertness: Awake/alert Behavior During Therapy: WFL for tasks assessed/performed Overall Cognitive Status: Within Functional Limits for tasks assessed                                        Exercises      General Comments General comments (skin integrity, edema, etc.): VSS on RA      Pertinent Vitals/Pain Pain Assessment: No/denies pain    Home Living  Prior Function            PT Goals (current goals can now be found in the care plan section) Acute Rehab PT Goals Patient Stated Goal: return to independence Progress towards PT goals: Progressing toward goals    Frequency    Min 4X/week      PT Plan Discharge plan needs to be updated    Co-evaluation              AM-PAC PT "6 Clicks" Mobility    Outcome Measure  Help needed turning from your back to your side while in a flat bed without using bedrails?: None Help needed moving from lying on your back to sitting on the side of a flat bed without using bedrails?: None Help needed moving to and from a bed to a chair (including a wheelchair)?: None Help needed standing up from a chair using your arms (e.g., wheelchair or bedside chair)?: None Help needed to walk in hospital room?: A Little Help needed climbing 3-5 steps with a railing? : A Lot 6 Click Score: 21    End of Session Equipment Utilized During Treatment: Gait belt Activity Tolerance: Patient tolerated treatment well Patient left: in chair;with call bell/phone within reach;with chair alarm set;with family/visitor present Nurse Communication: Mobility status PT Visit Diagnosis: Unsteadiness on feet (R26.81);Other abnormalities of gait and mobility (R26.89);Muscle weakness (generalized) (M62.81);Other symptoms and signs involving the nervous system (R29.898)     Time: 7322-0254 PT Time Calculation (min) (ACUTE ONLY): 32 min  Charges:  $Gait Training: 23-37 mins                     Zenaida Niece, PT, DPT Acute Rehabilitation Pager: [phone]    Zenaida Niece 08/04/2019, 1:01 PM

## 2019-08-05 LAB — BASIC METABOLIC PANEL
Anion gap: 8 (ref 5–15)
BUN: 9 mg/dL (ref 8–23)
CO2: 27 mmol/L (ref 22–32)
Calcium: 9 mg/dL (ref 8.9–10.3)
Chloride: 104 mmol/L (ref 98–111)
Creatinine, Ser: 1.12 mg/dL — ABNORMAL HIGH (ref 0.44–1.00)
GFR calc Af Amer: 52 mL/min — ABNORMAL LOW (ref >60)
GFR calc non Af Amer: 45 mL/min — ABNORMAL LOW (ref >60)
Glucose, Bld: 86 mg/dL (ref 70–99)
Potassium: 3.7 mmol/L (ref 3.5–5.1)
Sodium: 139 mmol/L (ref 135–145)

## 2019-08-05 LAB — CBC
HCT: 36.5 % (ref 36.0–46.0)
Hemoglobin: 11.4 g/dL — ABNORMAL LOW (ref 12.0–15.0)
MCH: 28.1 pg (ref 26.0–34.0)
MCHC: 31.2 g/dL (ref 30.0–36.0)
MCV: 90.1 fL (ref 80.0–100.0)
Platelets: 250 10*3/uL (ref 150–400)
RBC: 4.05 MIL/uL (ref 3.87–5.11)
RDW: 14.4 % (ref 11.5–15.5)
WBC: 6 10*3/uL (ref 4.0–10.5)
nRBC: 0 % (ref 0.0–0.2)

## 2019-08-05 MED ORDER — BISACODYL 10 MG RE SUPP
10.0000 mg | Freq: Once | RECTAL | Status: AC
  Administered 2019-08-05: 10 mg via RECTAL
  Filled 2019-08-05: qty 1

## 2019-08-05 MED ORDER — FLEET ENEMA 7-19 GM/118ML RE ENEM
1.0000 | ENEMA | Freq: Once | RECTAL | Status: AC | PRN
  Administered 2019-08-05: 1 via RECTAL
  Filled 2019-08-05: qty 1

## 2019-08-05 MED ORDER — WHITE PETROLATUM EX OINT: TOPICAL_OINTMENT | CUTANEOUS | Status: DC | PRN

## 2019-08-05 NOTE — Progress Notes (Signed)
STROKE TEAM PROGRESS NOTE   INTERVAL HISTORY She reports doing well today after mechanical thrombectomy.  She has been ambulating with a walker in the halls.  She did complain of abdominal pain but this has been better with the bowel movement.   OBJECTIVE Vitals:   08/05/19 0442 08/05/19 0820 08/05/19 1239 08/05/19 1634  BP: (!) 151/65 (!) 165/92 (!) 132/54 (!) 153/72  Pulse: 65 67 (!) 52 63  Resp: 18 20 18 18   Temp: 97.7 F (36.5 C) 98.2 F (36.8 C) 98 F (36.7 C) 97.9 F (36.6 C)  TempSrc: Oral Oral Oral Oral  SpO2: 97% 96% 100% 100%  Weight:        CBC:  Recent Labs  Lab 08/02/19 1730 08/03/19 1031 08/04/19 0602 08/05/19 0810  WBC 6.7   < > 11.0* 6.0  NEUTROABS 4.4  --   --   --   HGB 12.3   < > 11.7* 11.4*  HCT 38.7   < > 36.9 36.5  MCV 90.0   < > 90.2 90.1  PLT 249   < > 247 250   < > = values in this interval not displayed.    Basic Metabolic Panel:  Recent Labs  Lab 08/04/19 0602 08/05/19 0810  NA 140 139  K 3.9 3.7  CL 108 104  CO2 23 27  GLUCOSE 97 86  BUN 11 9  CREATININE 1.26* 1.12*  CALCIUM 9.2 9.0    Lipid Panel:     Component Value Date/Time   CHOL 144 08/03/2019 0618   TRIG 53 08/03/2019 0618   HDL 78 08/03/2019 0618   CHOLHDL 1.8 08/03/2019 0618   VLDL 11 08/03/2019 0618   LDLCALC 55 08/03/2019 0618   HgbA1c: No results found for: HGBA1C Urine Drug Screen: No results found for: LABOPIA, COCAINSCRNUR, LABBENZ, AMPHETMU, THCU, LABBARB  Alcohol Level No results found for: ETH  IMAGING  CT Angio Head W or Wo Contrast CT Angio Neck W and/or Wo Contrast 08/02/2019 IMPRESSION:  Occlusion of a right proximal M2 branch. No other abnormal anterior circulation finding. Posterior circulation intracranial branch vessels are intact. No carotid bifurcation disease.  No vertebral artery origin disease. Minimal aortic atherosclerosis.   CT HEAD CODE STROKE WO CONTRAST 08/02/2019 IMPRESSION:  Chronic small-vessel ischemic changes of the  cerebral hemispheric white matter. Probable acute or subacute infarction in the left cerebellar hemisphere. No mass effect or hemorrhage.   Neuro Interventional Radiology - Cerebral Angiogram with Intervention - Dr. Roylene Reason Sindy Messing 08/02/19 Mid right M2/MCA posterior division branch occlusion. Mechanical thrombectomy performed with direct contact aspiration. Total one pass, TICI3.  Transthoracic Echocardiogram  1. Left ventricular ejection fraction, by estimation, is 60 to 65%. The  left ventricle has normal function. The left ventricle has no regional  wall motion abnormalities. There is mild concentric left ventricular  hypertrophy. Left ventricular diastolic  parameters are consistent with Grade II diastolic dysfunction  (pseudonormalization). Elevated left atrial pressure.  2. Right ventricular systolic function is normal. The right ventricular  size is normal. There is mildly elevated pulmonary artery systolic  pressure. The estimated right ventricular systolic pressure is 89.3 mmHg.  3. Left atrial size was mildly dilated.  4. The mitral valve is normal in structure. No evidence of mitral valve  regurgitation. No evidence of mitral stenosis.  5. The aortic valve is normal in structure. Aortic valve regurgitation is  not visualized. No aortic stenosis is present.  6. The inferior vena cava is normal in  size with greater than 50%  respiratory variability, suggesting right atrial pressure of 3 mmHg.   ECG - Sinus or ectopic atrial rhythym rate 63 BPM. (See cardiology reading for complete details)   PHYSICAL EXAM   Temp:  [97.7 F (36.5 C)-98.6 F (37 C)] 97.9 F (36.6 C) (06/20 1634) Pulse Rate:  [52-70] 63 (06/20 1634) Resp:  [18-20] 18 (06/20 1634) BP: (132-165)/(54-92) 153/72 (06/20 1634) SpO2:  [96 %-100 %] 100 % (06/20 1634)  General - Well nourished, well developed, in no apparent distress.  Ophthalmologic - fundi not visualized due to  noncooperation.  Cardiovascular - Regular rhythm and rate, not in afib or aflutter.  Mental Status -  Level of arousal and orientation to time, place, and person were intact. Language including expression, repetition, comprehension was assessed and found intact.   Cranial Nerves II - XII - II - Visual field intact OU. III, IV, VI - Extraocular movements intact. V - Facial sensation intact bilaterally. VII - left mild facial nasolabial fold flatterning. VIII - Hearing & vestibular intact bilaterally. X - Palate elevates symmetrically. XI - Chin turning & shoulder shrug intact bilaterally XII - Tongue protrusion intact.  Motor Strength - The patient's strength was normal in RUE and RLE, left side seems normal today with 5/5 in the upper or lower extremities with bulk was normal and fasciculations were absent.   Motor Tone - Muscle tone was assessed at the neck and appendages and was normal.  Reflexes - The patient's reflexes were symmetrical in all extremities and she had no pathological reflexes.  Sensory - Light touch, temperature/pinprick were assessed and were symmetrical subjectively.    Coordination - The patient had normal movements in the right hand with no ataxia or dysmetria.  No dysmetria noted today in the left upper extremity.  Tremor was absent.  Gait and Station -using a walker and somewhat slow.   ASSESSMENT/PLAN [name] is a 86 y.o. female with history of atrial fibrillation (Plavix PTA), CAD, CKD, Hld, Htn, stomach cancer, aspirin allergy, recent cerebellar stroke on June 6 [facility]), presenting with confusion and speech difficulties. She did not receive IV t-PA due to recent stroke Mechanical thrombectomy mid right M2/MCA posterior division branch occlusion. Total one pass, TICI3.  Stroke: right MCA small/punctate scattered infarcts with punctate left MCA/ACA and right cerebellum infarcts with right M2 occlusion s/p TICI3 reperfusion - embolic pattern due to  paroxysmal AF or Aflutter  CT Head - subacute infarction in the left cerebellar hemisphere. No mass effect or hemorrhage.   CTA H&N - Occlusion of a right proximal M2 branch.    IR - thrombectomy with TICI3 revascularization  MRI head - right MCA small/punctate scattered infarcts with punctate left MCA/ACA and right cerebellum infarcts  MRA head - right MCA M2 patent  2D Echo EF 60 to 65%  [employer] Virus 2 - negative  LDL 55  HgbA1c - still pending from 6/18 - may have to reorder  VTE prophylaxis - SCDs  clopidogrel 75 mg daily prior to admission, now on Eliquis (started Saturday 6/19) -> Plavix D/C'd  Patient counseled to be compliant with her antithrombotic medications  Ongoing aggressive stroke risk factor management  Therapy recommendations: CIR  Disposition:  Pending   Hx of stroke  6/6-6/11 dizziness - admission to [facility] for left cerebellar infarct.  CTA head and neck negative.  Put on Plavix and statin on discharge.  Recommend heart monitor for 30 days as outpatient.  Heart monitor on  hold during current admission  This time MRI showed subacute left cerebellum infarct with petechial hemorrhage  Paroxysmal a flutter versus A. Fib  Paroxysmal a flutter in 2013 status post cardioversion  Paroxysmal a flutter in 2014 status post cardioversion  As per [facility] note, patient was on Xarelto but stopped due to GI bleeding  Per patient and daughter, patient was not on Xarelto before and had no GI bleeding.  Most likely the cause of recent recurrent embolic strokes  Eliquis started 6/19 Aspirin allergy - anaphylaxis   Hypertension  Home BP meds: Lopressor ; Calan ; Apresoline ; Zestril  Current BP meds: metoprolol ; verapamil  On cleviprex  Stable . BP goal 120-140 within 24h of procedure, then relax to < 180 . Long-term BP goal normotensive  Hyperlipidemia  Home Lipid lowering medication: Lipitor 40 mg daily  LDL 55, goal < 70  Current lipid  lowering medication: Lipitor 40 mg daily  Continue statin at discharge  Other Stroke Risk Factors  Advanced age  Coronary artery disease  Other Active Problems  Code status - Full Code  Aspirin allergy - anaphylaxis  CKD - stage 3b - creatinine - 1.20->1.10->1.26->1.12  Leukocytosis - 8.9->11.0->6.0 (temp 97.9)  Hospital day # 3      To contact Stroke Continuity provider, please refer to [email or web address] After hours, contact General Neurology

## 2019-08-06 ENCOUNTER — Encounter (HOSPITAL_COMMUNITY): Payer: Self-pay | Admitting: Neurology

## 2019-08-06 DIAGNOSIS — I639 Cerebral infarction, unspecified: Secondary | ICD-10-CM

## 2019-08-06 LAB — CBC
HCT: 36.4 % (ref 36.0–46.0)
Hemoglobin: 11.6 g/dL — ABNORMAL LOW (ref 12.0–15.0)
MCH: 28.8 pg (ref 26.0–34.0)
MCHC: 31.9 g/dL (ref 30.0–36.0)
MCV: 90.3 fL (ref 80.0–100.0)
Platelets: 232 10*3/uL (ref 150–400)
RBC: 4.03 MIL/uL (ref 3.87–5.11)
RDW: 14.2 % (ref 11.5–15.5)
WBC: 5.6 10*3/uL (ref 4.0–10.5)
nRBC: 0 % (ref 0.0–0.2)

## 2019-08-06 LAB — BASIC METABOLIC PANEL
Anion gap: 7 (ref 5–15)
BUN: 8 mg/dL (ref 8–23)
CO2: 27 mmol/L (ref 22–32)
Calcium: 9.1 mg/dL (ref 8.9–10.3)
Chloride: 108 mmol/L (ref 98–111)
Creatinine, Ser: 1.16 mg/dL — ABNORMAL HIGH (ref 0.44–1.00)
GFR calc Af Amer: 50 mL/min — ABNORMAL LOW (ref >60)
GFR calc non Af Amer: 43 mL/min — ABNORMAL LOW (ref >60)
Glucose, Bld: 88 mg/dL (ref 70–99)
Potassium: 4 mmol/L (ref 3.5–5.1)
Sodium: 142 mmol/L (ref 135–145)

## 2019-08-06 LAB — HEMOGLOBIN A1C

## 2019-08-06 NOTE — Discharge Instructions (Signed)

## 2019-08-06 NOTE — Progress Notes (Signed)
Inpatient Rehabilitation-Admissions Coordinator   Met with pt bedside as follow up from PM&R consult (please see consult note completed by Dr. Letta Pate on 6/21 for details). Discussed recommended rehab program with pt and her daughter. Reviewed expectations, anticipated LOS, and expected functional outcomes. Both very interested in CIR program and would like to pursue. Unfortunately I do not have a bed available for this patient today. Will follow up tomorrow for possible admit, pending bed availability.   Raechel Ache, OTR/L  Rehab Admissions Coordinator  [phone] 08/06/2019 2:51 PM

## 2019-08-06 NOTE — Progress Notes (Signed)
[place] for Eliquis Indication: atrial fibrillation  Allergies  Allergen Reactions  . Aciphex [Rabeprazole]     Hives  . Aspirin     Anaphylaxis  . Hydralazine     Nausea  . Morphine And Related     Emesis    Patient Measurements: Weight: 73.1 kg (161 lb 2.5 oz)  Vital Signs: Temp: 98.2 F (36.8 C) (06/21 0430) Temp Source: Oral (06/21 0430) BP: 148/64 (06/21 0430) Pulse Rate: 66 (06/21 0430)  Labs: Recent Labs    08/04/19 0602 08/04/19 0602 08/05/19 0810 08/06/19 0449  HGB 11.7*   < > 11.4* 11.6*  HCT 36.9  --  36.5 36.4  PLT 247  --  250 232  CREATININE 1.26*  --  1.12* 1.16*   < > = values in this interval not displayed.    CrCl cannot be calculated (Unknown ideal weight.).   Medical History: Past Medical History:  Diagnosis Date  . Atrial fibrillation ([place])   . CAD (coronary artery disease)   . CKD (chronic kidney disease) stage 3, GFR 30-59 ml/min   . CVA (cerebral vascular accident) ([place])   . HLD (hyperlipidemia)   . HTN (hypertension)   . 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([facility])     Medications:  Scheduled:  . apixaban  5 mg Oral BID  . atorvastatin  40 mg Oral QHS  . Chlorhexidine Gluconate Cloth  6 each Topical Daily  . furosemide  20 mg Oral Daily  . metoprolol tartrate  25 mg Oral BID  . polyethylene glycol  17 g Oral Daily  . sodium chloride flush  3 mL Intravenous Once  . verapamil  160 mg Oral BID   Infusions:    Assessment: [name] admitted on 6/17 for new stroke. Recent discharge from [place] on 6/11 for cerebellar stroke. Patient has a history of atrial fibrillation but was not on anticoagulation PTA. Per care everywhere note from [facility], patient had been on Xarelto in the past but this was stopped due to GIB. Family denies this. After MD discussion with family today, plan to start Eliquis. Dose appropriate for age, weight and renal function.   Goal of Therapy:  Monitor platelets by anticoagulation protocol:  Yes   Plan:  Eliquis 5 mg PO BID  Will educate patient/family prior to discharge Pharmacy will sign off.  Jodi [name] Jodi [name], PharmD, BCPS, FNKF Clinical Pharmacist [place] Please utilize Amion for appropriate phone number to reach the unit pharmacist (B and E)

## 2019-08-06 NOTE — Consult Note (Signed)
Physical Medicine and Rehabilitation Consult  Reason for Consult: Stroke with functional deficits.  Referring Physician: Dr. Erlinda Hong   HPI: Jodi [name] is a 86 y.o. LH- female with history of HTN, CAD, PAF, right cerebellar CVA 07/22/19 and d/c from [facility] on Plavix. She was admitted on 08/02/19 with confusion and disorientation. She was found to have occlusion of right proximal M2 branch and underwent cerebral angio with mechanical thrombectomy with complete recanalization of right M2 MCA posterior division.  Follow up MRI/MRA brain done revealing widely scattered punctate infarcts in right hemisphere from posterior corona radiata to lentiform, occasional infarct in left hemisphere and cerebellum and chronic Left cerebellar PICA infract. 2D echo with EF 60-65% mild concentric LVH.  Patient with history of multiple strokes and not on AC due to question of GIB--patient/family denied hx. Dr. Erlinda Hong felt that stroke as embolic due to A fib and Eliquis added for stroke prevention. Patient with dysarthria with balance deficits and LUE proprioceptive deficits. CIR recommended due to functional deficits.   Daughter is at bedside, she indicates that she will be able to help post discharge and will be able to stay overnight however during the day will be running errands.  The patient will be moved to a 2 bedroom apartment after discharge.  Also the daughter would like to get a larger home and is trying to sell her own home and bring the patient back up with her once that is accomplished.  Review of Systems  Constitutional: Negative for chills and fever.  HENT: Negative for hearing loss and tinnitus.   Eyes: Negative for blurred vision and double vision.  Respiratory: Negative for cough and shortness of breath.   Cardiovascular: Negative for chest pain and palpitations.  Gastrointestinal: Negative for constipation, heartburn and nausea.  Genitourinary: Negative for dysuria and urgency.  Musculoskeletal:  Negative for back pain, myalgias and neck pain.  Skin: Negative for rash.  Neurological: Positive for dizziness and weakness. Negative for speech change.      Past Medical History:  Diagnosis Date  . Atrial fibrillation ([place])   . CAD (coronary artery disease)   . CKD (chronic kidney disease) stage 3, GFR 30-59 ml/min   . CVA (cerebral vascular accident) ([place])   . HLD (hyperlipidemia)   . HTN (hypertension)   . 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[facility])     Past Surgical History:  Procedure Laterality Date  . IR CT HEAD LTD  08/02/2019  . IR PERCUTANEOUS ART THROMBECTOMY/INFUSION INTRACRANIAL INC DIAG ANGIO  08/02/2019      . IR PERCUTANEOUS ART THROMBECTOMY/INFUSION INTRACRANIAL INC DIAG ANGIO  08/02/2019  . RADIOLOGY WITH ANESTHESIA N/A 08/02/2019   Procedure: IR WITH ANESTHESIA;  Surgeon: Luanne Bras, MD;  Location: [facility];  Service: Radiology;  Laterality: N/A;    Family History  Problem Relation Age of Onset  . Prostate cancer Father      Social History: Married. Retired Quarry manager. Was independent with cane PTA. She moved to [place] a few years ago and lives alone. Daughter married and works part time. She does not use tobacco, smokeless tobacco or alcohol.    Allergies  Allergen Reactions  . Aciphex [Rabeprazole]     Hives  . Aspirin     Anaphylaxis  . Hydralazine     Nausea  . Morphine And Related     Emesis    Medications Prior to Admission  Medication Sig Dispense Refill  . albuterol (VENTOLIN HFA) 108 (90 Base) MCG/ACT inhaler Inhale 2 puffs  into the lungs every 4 (four) hours as needed for wheezing or shortness of breath.     Marland Kitchen atorvastatin (LIPITOR) 40 MG tablet Take 40 mg by mouth at bedtime.    . Cholecalciferol (VITAMIN D) 50 MCG (2000 UT) CAPS Take 1 capsule by mouth daily.    . clopidogrel (PLAVIX) 75 MG tablet Take 75 mg by mouth daily.    . furosemide (LASIX) 20 MG tablet Take 20 mg by mouth daily.    . hydrALAZINE (APRESOLINE) 25 MG tablet Take 25 mg by mouth 2 (two) times  daily.    Marland Kitchen lisinopril (ZESTRIL) 20 MG tablet Take 20 mg by mouth daily.    . meclizine (ANTIVERT) 12.5 MG tablet Take 12.5 mg by mouth 3 (three) times daily as needed for dizziness.     . metoprolol tartrate (LOPRESSOR) 50 MG tablet Take 50 mg by mouth 2 (two) times daily.    Marland Kitchen senna (SENOKOT) 8.6 MG tablet Take 1 tablet by mouth daily as needed for constipation.    . triamcinolone (KENALOG) 0.1 % paste Use as directed 1 application in the mouth or throat 2 (two) times daily.    . verapamil (CALAN) 80 MG tablet Take 160 mg by mouth 2 (two) times daily.       Home: Home Living Family/patient expects to be discharged to:: Private residence Living Arrangements: Alone Available Help at Discharge: Family Type of Home: House Home Access: Stairs to enter Technical brewer of Steps: 3 Entrance Stairs-Rails:  (unure) Home Layout: One level Bathroom Shower/Tub: Chiropodist: [place]: Environmental consultant - 2 wheels, Wheelchair - manual Additional Comments: RN does report after session that the patient's daughter was going to sign an apartment lease today so the patient and daughter could live together  Lives With: Alone  Functional History: Prior Function Level of Independence: Independent Comments: pt reports that she had progressed to ambulating without an assistive device since most recent stroke Functional Status:  Mobility: Bed Mobility Overal bed mobility: Needs Assistance Bed Mobility: Supine to Sit Supine to sit: Supervision General bed mobility comments: up in chair Transfers Overall transfer level: Needs assistance Equipment used: Rolling walker (2 wheeled) Transfers: Sit to/from Stand Sit to Stand: Supervision Stand pivot transfers: Min assist General transfer comment: pt initially without device, some improvement with use of RW to power up to standing Ambulation/Gait Ambulation/Gait assistance: Min guard, Supervision (minG progressing to close  supervision) Gait Distance (Feet): 200 Feet Assistive device: Rolling walker (2 wheeled) Gait Pattern/deviations: Step-through pattern, Decreased stride length General Gait Details: pt with shortened step through gait, pt is able to perform head turns, change gait speed, stop abruptly without noticeablle LOB. Pt does ambulate with reduced gait speed when not requested to ambulate at a certain speed Gait velocity: reduced Gait velocity interpretation: <1.8 ft/sec, indicate of risk for recurrent falls    ADL: ADL Overall ADL's : Needs assistance/impaired Eating/Feeding: Set up, Sitting Grooming: Set up, Sitting Upper Body Bathing: Minimal assistance, Sitting Lower Body Bathing: Moderate assistance, Sit to/from stand, Sitting/lateral leans Upper Body Dressing : Minimal assistance, Sitting Lower Body Dressing: Moderate assistance, Sit to/from stand, Sitting/lateral leans Toilet Transfer: Moderate assistance, Stand-pivot, BSC Toileting- Clothing Manipulation and Hygiene: Moderate assistance, Sitting/lateral lean, Sit to/from stand Functional mobility during ADLs: Moderate assistance, Cueing for safety, Cueing for sequencing, Rolling walker  Cognition: Cognition Overall Cognitive Status: Within Functional Limits for tasks assessed Arousal/Alertness: Awake/alert Orientation Level: Oriented X4 Attention: Sustained Sustained Attention: Appears intact Memory: Impaired Memory  Impairment: Retrieval deficit, Decreased recall of new information Awareness: Impaired Awareness Impairment: Emergent impairment Problem Solving: Impaired Problem Solving Impairment: Functional basic Safety/Judgment: Impaired Cognition Arousal/Alertness: Awake/alert Behavior During Therapy: WFL for tasks assessed/performed Overall Cognitive Status: Within Functional Limits for tasks assessed Area of Impairment: Problem solving Problem Solving: Slow processing, Difficulty sequencing, Requires verbal cues General  Comments: difficulty processing multi step commands with testing  Blood pressure (!) 148/64, pulse 66, temperature 98.2 F (36.8 C), temperature source Oral, resp. rate 16, weight 73.1 kg, SpO2 98 %. Physical Exam  Nursing note and vitals reviewed. Constitutional: She is oriented to person, place, and time.  HENT:  Head: Normocephalic and atraumatic.  Mouth/Throat: Mucous membranes are moist.  Eyes: Pupils are equal, round, and reactive to light. Conjunctivae are normal.  exophthalmus   Cardiovascular: Normal rate. An irregular rhythm present.  No murmur heard. Respiratory: Effort normal and breath sounds normal. No stridor. No respiratory distress. She has no wheezes. She has no rhonchi.  GI: Soft. Normal appearance and bowel sounds are normal. She exhibits no distension and no mass.  Musculoskeletal:        General: No deformity. Normal range of motion.     Cervical back: No rigidity.     Right lower leg: No edema.     Left lower leg: No edema.  Neurological: She is alert and oriented to person, place, and time. She displays no weakness. No sensory deficit. She has a normal Finger-Nose-Finger Test.  Speech clear. Able to follow simple one and two step commands without difficulty. Decrease in fine motor control RUE.  Motor strength is 4/5 bilateral deltoid, bicep, tricep, grip, hip flexor, knee extensor, ankle dorsiflexor  Skin: Skin is warm and dry.  Psychiatric: Her behavior is normal. Mood normal.    Results for orders placed or performed during the hospital encounter of 08/02/19 (from the past 24 hour(s))  CBC     Status: Abnormal   Collection Time: 08/06/19  4:49 AM  Result Value Ref Range   WBC 5.6 4.0 - 10.5 K/uL   RBC 4.03 3.87 - 5.11 MIL/uL   Hemoglobin 11.6 (L) 12.0 - 15.0 g/dL   HCT 36.4 36 - 46 %   MCV 90.3 80.0 - 100.0 fL   MCH 28.8 26.0 - 34.0 pg   MCHC 31.9 30.0 - 36.0 g/dL   RDW 14.2 11.5 - 15.5 %   Platelets 232 150 - 400 K/uL   nRBC 0.0 0.0 - 0.2 %  Basic  metabolic panel     Status: Abnormal   Collection Time: 08/06/19  4:49 AM  Result Value Ref Range   Sodium 142 135 - 145 mmol/L   Potassium 4.0 3.5 - 5.1 mmol/L   Chloride 108 98 - 111 mmol/L   CO2 27 22 - 32 mmol/L   Glucose, Bld 88 70 - 99 mg/dL   BUN 8 8 - 23 mg/dL   Creatinine, Ser 1.16 (H) 0.44 - 1.00 mg/dL   Calcium 9.1 8.9 - 10.3 mg/dL   GFR calc non Af Amer 43 (L) >60 mL/min   GFR calc Af Amer 50 (L) >60 mL/min   Anion gap 7 5 - 15   No results found.   Assessment/Plan: Diagnosis: Bilateral small cardioembolic infarcts in the cerebral hemispheres secondary to atrial fibrillation 1. Does the need for close, 24 hr/day medical supervision in concert with the patient's rehab needs make it unreasonable for this patient to be served in a less intensive setting? Yes 2.  Co-Morbidities requiring supervision/potential complications: Hypertension, coronary artery disease, anticoagulation 3. Due to bladder management, bowel management, safety, skin/wound care, disease management, medication administration, pain management and patient education, does the patient require 24 hr/day rehab nursing? Yes 4. Does the patient require coordinated care of a physician, rehab nurse, therapy disciplines of PT, OT to address physical and functional deficits in the context of the above medical diagnosis(es)? Yes Addressing deficits in the following areas: balance, endurance, locomotion, strength, transferring, bowel/bladder control, bathing, dressing, feeding, grooming, toileting, cognition and psychosocial support 5. Can the patient actively participate in an intensive therapy program of at least 3 hrs of therapy per day at least 5 days per week? Yes 6. The potential for patient to make measurable gains while on inpatient rehab is good 7. Anticipated functional outcomes upon discharge from inpatient rehab are modified independent and supervision  with PT, modified independent and supervision with OT, n/a  with SLP. 8. Estimated rehab length of stay to reach the above functional goals is: 5 to 7 days 9. Anticipated discharge destination: Home 10. Overall Rehab/Functional Prognosis: good  RECOMMENDATIONS: This patient's condition is appropriate for continued rehabilitative care in the following setting: CIR Patient has agreed to participate in recommended program. Potentially Note that insurance prior authorization may be required for reimbursement for recommended care.  Comment:    Bary Leriche, PA-C 08/06/2019  "I have personally performed a face to face diagnostic evaluation of this patient.  Additionally, I have reviewed and concur with the physician assistant's documentation above." Charlett Blake M.D. [facility] FAAPM&R (Neuromuscular Med) Diplomate Am Board of Electrodiagnostic Med Fellow Am Board of Interventional Pain

## 2019-08-06 NOTE — Progress Notes (Addendum)
STROKE TEAM PROGRESS NOTE   INTERVAL HISTORY Patient is sitting up in bed.  She is doing well.  She has no questions.  Vital signs are stable.  Neurological exam unchanged  OBJECTIVE Vitals:   08/05/19 1955 08/06/19 0029 08/06/19 0430 08/06/19 1243  BP: (!) 162/75 (!) 153/66 (!) 148/64 (!) 147/69  Pulse: 71 62 66 (!) 54  Resp: 18 16 16 18   Temp: 98.6 F (37 C) 98.4 F (36.9 C) 98.2 F (36.8 C) 98.4 F (36.9 C)  TempSrc: Oral Oral Oral Oral  SpO2: 100% 99% 98% 100%  Weight:        CBC:  Recent Labs  Lab 08/02/19 1730 08/03/19 1031 08/05/19 0810 08/06/19 0449  WBC 6.7   < > 6.0 5.6  NEUTROABS 4.4  --   --   --   HGB 12.3   < > 11.4* 11.6*  HCT 38.7   < > 36.5 36.4  MCV 90.0   < > 90.1 90.3  PLT 249   < > 250 232   < > = values in this interval not displayed.    Basic Metabolic Panel:  Recent Labs  Lab 08/05/19 0810 08/06/19 0449  NA 139 142  K 3.7 4.0  CL 104 108  CO2 27 27  GLUCOSE 86 88  BUN 9 8  CREATININE 1.12* 1.16*  CALCIUM 9.0 9.1    IMAGING past 24h  No results found.   PHYSICAL EXAM    General - Well nourished, well developed, in no apparent distress.  Ophthalmologic - fundi not visualized due to noncooperation.  Cardiovascular - Regular rhythm and rate, not in afib or aflutter.  Mental Status -  Level of arousal and orientation to time, place, and person were intact. Language including expression, repetition, comprehension was assessed and found intact.   Cranial Nerves II - XII - II - Visual field intact OU. III, IV, VI - Extraocular movements intact. V - Facial sensation intact bilaterally. VII - left mild facial nasolabial fold flatterning. VIII - Hearing & vestibular intact bilaterally. X - Palate elevates symmetrically. XI - Chin turning & shoulder shrug intact bilaterally XII - Tongue protrusion intact.  Motor Strength - The patient's strength was normal in RUE and RLE, left side seems normal today with 5/5 in the upper or  lower extremities with bulk was normal and fasciculations were absent.   Motor Tone - Muscle tone was assessed at the neck and appendages and was normal.  Reflexes - The patient's reflexes were symmetrical in all extremities and she had no pathological reflexes.  Sensory - Light touch, temperature/pinprick were assessed and were symmetrical subjectively.    Coordination - The patient had normal movements in the right hand with no ataxia or dysmetria.  No dysmetria noted today in the left upper extremity.  Tremor was absent.  Gait and Station -using a walker and somewhat slow.   ASSESSMENT/PLAN [name] is a 86 y.o. female with history of atrial fibrillation (Plavix PTA), CAD, CKD, Hld, Htn, stomach cancer, aspirin allergy, recent cerebellar stroke on June 6 [facility]), presenting with confusion and speech difficulties. She did not receive IV t-PA due to recent stroke Mechanical thrombectomy mid right M2/MCA posterior division branch occlusion. Total one pass, TICI3.  Stroke: right MCA small/punctate scattered infarcts with punctate left MCA/ACA and right cerebellum infarcts with right M2 occlusion s/p TICI3 reperfusion - embolic pattern due to paroxysmal AF or Aflutter  CT Head - subacute infarction in the left cerebellar  hemisphere. No mass effect or hemorrhage.   CTA H&N - Occlusion of a right proximal M2 branch.    IR - thrombectomy with TICI3 revascularization  MRI head - right MCA small/punctate scattered infarcts with punctate left MCA/ACA and right cerebellum infarcts  MRA head - right MCA M2 patent  2D Echo EF 60 to 65%  [employer] Virus 2 - negative  LDL 55  HgbA1c - pending   VTE prophylaxis - SCDs  clopidogrel 75 mg daily prior to admission, now on Eliquis   Therapy recommendations: CIR  Disposition:  Pending   Hx of stroke  6/6-6/11 dizziness - admission to [facility] for left cerebellar infarct.  CTA head and neck negative.  Put on Plavix and statin on  discharge.  Recommend heart monitor for 30 days as outpatient.  Heart monitor on hold during current admission  This time MRI showed subacute left cerebellum infarct with petechial hemorrhage  Paroxysmal a flutter versus A. Fib  Paroxysmal a flutter in 2013 status post cardioversion  Paroxysmal a flutter in 2014 status post cardioversion  As per [facility] note, patient was on Xarelto but stopped due to GI bleeding  Per patient and daughter, patient was not on Xarelto before and had no GI bleeding.  Most likely the cause of recent recurrent embolic strokes  Eliquis started 6/19 Aspirin allergy - anaphylaxis   Hypertension  Home BP meds: Lopressor ; Calan ; Apresoline ; Zestril  Treated with cleviprex, now off  Current BP meds: metoprolol ; verapamil  Stable . SBP goal < 180 . Long-term BP goal normotensive  Hyperlipidemia  Home Lipid lowering medication: Lipitor 40 mg daily  LDL 55, goal < 70  Current lipid lowering medication: Lipitor 40 mg daily  Continue statin at discharge  Other Stroke Risk Factors  Advanced age  Coronary artery disease  Other Active Problems  Aspirin allergy - anaphylaxis  CKD - stage 3b - creatinine - 1.16 Leukocytosis - resolved  Hospital day # 4 Plan continue Eliquis.  Medically stable to be transferred to inpatient rehab over the next few days when bed becomes available.  Patient had history of GI bleeding on Xarelto in the past and if she has bleeding with Eliquis as well may need to consider alternatives like watchman device in the future Antony Contras, MD To contact Stroke Continuity provider, please refer to [email or web address] After hours, contact General Neurology

## 2019-08-06 NOTE — PMR Pre-admission (Signed)
PMR Admission Coordinator Pre-Admission Assessment  Patient: Jodi [name] is an 86 y.o., female MRN: [id number] DOB: 18-Nov-1932 Height:   Weight: 73.1 kg              Insurance Information HMO:     PPO:      PCP:      IPA:      80/20: yes     OTHER:  PRIMARY: Medicare Railroad      Policy#: [id number]     Subscriber: patient CM Name:       Phone#:      Fax#:  Pre-Cert#:       Employer:  Benefits:  Phone #: online     Name: verified eligibility online via [place] on 08/06/19 Eff. Date: part A and B effective 05/17/1999     Deduct: $1,484      Out of Pocket Max: NA      Life Max: NA  CIR: Covered per Medicare Railroad guidelines once yearly deductible has been met.    SNF: days 1-20, 100%, days 21-100, 80% Outpatient: 80%     Co-Pay: 20% Home Health: 100%      Co-Pay:  DME: 80%     Co-Pay: 20% Providers:  SECONDARY: Generic Commercial      Policy#: [id number]      Phone#: [phone]  TERTIARY: BCBS      Policy: [id number] Phone #: [phone]  Financial Counselor:       Phone#:   The "Data Collection Information Summary" for patients in Inpatient Rehabilitation Facilities with attached "Privacy Act [place] Records" was provided and verbally reviewed with: Patient and Family  Emergency Contact Information Contact Information    Name Relation Home Work Prien Daughter [phone]  [phone]     Current Medical History  Patient Admitting Diagnosis: Bilateral small cardioembolic infarcts in the cerebral hemispheres secondary to atrial fibrillation   History of Present Illness: Jodi [name] is a 86 y.o. 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 She was admitted on 08/02/19 with confusion and disorientation. She was found to have occlusion of right proximal M2 branch and underwent cerebral angio with mechanical thrombectomy with complete recanalization of right M2 MCA posterior  division.  Follow up MRI/MRA brain done revealing widely scattered punctate infarcts in right hemisphere from posterior corona radiata to lentiform, occasional infarct in left hemisphere and cerebellum and chronic Left cerebellar PICA infract. 2D echo with EF 60-65% mild concentric LVH.  Patient with history of multiple strokes and not on AC due to question of GIB--patient/family denied hx. Dr. Erlinda Hong felt that stroke as embolic due to A fib and Eliquis added for stroke prevention. Patient with dysarthria with balance deficits and LUE proprioceptive deficits. CIR recommended due to functional deficits. Pt is to admit to CIR on 08/07/19.  Complete NIHSS TOTAL: 0 Glasgow Coma Scale Score: 15  Past Medical History  Past Medical History:  Diagnosis Date  . Atrial fibrillation ([place])   . CAD (coronary artery disease)   . CKD (chronic kidney disease) stage 3, GFR 30-59 ml/min   . CVA (cerebral vascular accident) ([place])   . HLD (hyperlipidemia)   . HTN (hypertension)   . 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([place])     Family History  family history includes Prostate cancer in her father.  Prior Rehab/Hospitalizations:  Has the patient had prior rehab or hospitalizations prior to admission? No  Has the patient had major surgery during 100  days prior to admission? Yes  Current Medications   Current Facility-Administered Medications:  .  acetaminophen (TYLENOL) tablet 650 mg, 650 mg, Oral, Q4H PRN, 650 mg at 08/04/19 1514 **OR** acetaminophen (TYLENOL) 160 MG/5ML solution 650 mg, 650 mg, Per Tube, Q4H PRN **OR** acetaminophen (TYLENOL) suppository 650 mg, 650 mg, Rectal, Q4H PRN, Kerney Elbe, MD .  apixaban (ELIQUIS) tablet 5 mg, 5 mg, Oral, BID, Ronna Polio, RPH, 5 mg at 08/07/19 0919 .  atorvastatin (LIPITOR) tablet 40 mg, 40 mg, Oral, QHS, Kerney Elbe, MD, 40 mg at 08/06/19 2207 .  Chlorhexidine Gluconate Cloth 2 % PADS 6 each, 6 each, Topical, Daily, Kerney Elbe, MD, 6 each at 08/06/19 1012 .  furosemide  (LASIX) tablet 20 mg, 20 mg, Oral, Daily, Rosalin Hawking, MD, 20 mg at 08/07/19 0932 .  metoprolol tartrate (LOPRESSOR) tablet 25 mg, 25 mg, Oral, BID, Rosalin Hawking, MD, 25 mg at 08/07/19 0931 .  ondansetron (ZOFRAN) injection 4 mg, 4 mg, Intravenous, Q6H PRN, de Sindy Messing, [place], MD .  phenol (CHLORASEPTIC) mouth spray 1 spray, 1 spray, Mouth/Throat, PRN, Kerney Elbe, MD .  polyethylene glycol (MIRALAX / GLYCOLAX) packet 17 g, 17 g, Oral, Daily, [place], Kofi, MD, 17 g at 08/07/19 0916 .  senna-docusate (Senokot-S) tablet 1 tablet, 1 tablet, Oral, QHS PRN, Kerney Elbe, MD, 1 tablet at 08/04/19 2228 .  sodium chloride flush (NS) 0.9 % injection 3 mL, 3 mL, Intravenous, Once, Daleen Bo, MD .  verapamil (CALAN) tablet 160 mg, 160 mg, Oral, BID, Kerney Elbe, MD, 160 mg at 08/07/19 0932 .  white petrolatum (VASELINE) gel, , Topical, PRN, Rosalin Hawking, MD  Patients Current Diet:  Diet Order            Diet Heart Room service appropriate? 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  Independent  Indoor Mobility: Did the patient need assistance with walking from room to room (with or without device)? Independent  Stairs: Did the patient need assistance with internal or external stairs (with or without device)? Independent  Functional Cognition: Did the patient need  help planning regular tasks such as shopping or remembering to take medications? Needed some help  Home Assistive Devices / [place] Devices/Equipment: None Home Equipment: Walker - 2 wheels, Wheelchair - manual  Prior Device Use: Indicate devices/aids used by the patient prior to current illness, exacerbation or injury? 4 point cane  Current Functional Level Cognition  Arousal/Alertness: Awake/alert Overall Cognitive Status: Within Functional Limits for tasks assessed Orientation Level: Oriented X4 General Comments: difficulty processing multi step commands with testing Attention: Sustained Sustained Attention: Appears intact Memory: Impaired Memory Impairment: Retrieval deficit, Decreased recall of new information Awareness: Impaired Awareness Impairment: Emergent impairment Problem Solving: Impaired Problem Solving Impairment: Functional basic Safety/Judgment: Impaired    Extremity Assessment (includes Sensation/Coordination)  Upper Extremity Assessment: LUE  deficits/detail LUE Deficits / Details: diffuse weakness with poor proprioception and motor control LUE Sensation: decreased proprioception LUE Coordination: decreased fine motor, decreased gross motor  Lower Extremity Assessment: Generalized weakness    ADLs  Overall ADL's : Needs assistance/impaired Eating/Feeding: Set up, Sitting Grooming: Set up, Sitting Upper Body Bathing: Minimal assistance, Sitting Lower Body Bathing: Moderate assistance, Sit to/from stand, Sitting/lateral leans Upper Body Dressing : Minimal assistance, Sitting Lower Body Dressing: Moderate assistance, Sit to/from stand, Sitting/lateral leans Toilet Transfer: Moderate assistance, Stand-pivot, BSC Toileting- Clothing Manipulation and Hygiene: Moderate assistance, Sitting/lateral lean, Sit to/from stand Functional mobility during ADLs: Moderate assistance, Cueing for safety, Cueing for sequencing, Rolling walker    Mobility   Overal bed mobility: Modified Independent Bed Mobility: Supine to Sit Supine to sit: Supervision General bed mobility comments: up in chair    Transfers  Overall transfer level: Needs assistance Equipment used: None Transfers: Sit to/from Stand Sit to Stand: Min guard Stand pivot transfers: Min assist General transfer comment: min guard for safety     Ambulation / Gait / Stairs / Wheelchair Mobility  Ambulation/Gait Ambulation/Gait assistance: Min guard, Min assist Gait Distance (Feet): 200 Feet Assistive device: 1 person hand held assist (assist at trunk with gait belt) Gait Pattern/deviations: Step-through pattern, Decreased stride length, Narrow base of support, Staggering right General Gait Details: initially with shuffling gait pattern and able to improved stride length and cadence with verbal cues; assistance required at trunk with gait belt and then with gait belt and HHA +1 on return to room due to impaired balance without use of AD; pt staggers to R and with near LOB with L horizontal head turns Gait velocity: reduced Gait velocity interpretation: <1.8 ft/sec, indicate of risk for recurrent falls    Posture / Balance Dynamic Sitting Balance Sitting balance - Comments: supervision at the edge of bed Balance Overall balance assessment: Needs assistance Sitting-balance support: No upper extremity supported, Feet supported Sitting balance-Leahy Scale: Good Sitting balance - Comments: supervision at the edge of bed Postural control: Posterior lean Standing balance support: Single extremity supported, During functional activity Standing balance-Leahy Scale: Poor Standing balance comment: close supervision reaching >10 inches outside of her BOS    Special needs/care consideration Skin: closed incision to right anterior wrist   Designated visitor: daughter [place] (from acute therapy documentation) Living Arrangements: Alone  Lives With:  Alone Available Help at Discharge: Family Type of Home: House Home Layout: One level Home Access: Stairs to enter Entrance Stairs-Rails:  (unure) Technical brewer of Steps: 3 Bathroom Shower/Tub: Chiropodist: Standard Home Care Services: No Additional Comments: RN does report after session that the patient's daughter was going to sign an apartment lease today so the patient and daughter could live together  Discharge Living Setting Plans for Discharge Living Setting: Apartment (plan for pt and duaghter to move from 1 bd to 2 bd apartment) Type of Home at Discharge: Apartment Discharge Home Layout: One level Discharge Home Access: Stairs to enter Entrance Stairs-Rails: None Entrance Stairs-Number of Steps: 3 Discharge Bathroom Shower/Tub: Tub/shower unit Discharge Bathroom Toilet: Standard Discharge Bathroom Accessibility: Yes How Accessible: Accessible via walker Does the patient have any problems obtaining your medications?: No  Social/Family/Support Systems Patient Roles: Other (Comment) (retired Quarry manager; 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 Information: daugther: [name] [phone] Anticipated Caregiver: daughter Anticipated Caregiver's Contact Information: see above Ability/Limitations of Caregiver: Min A/G Caregiver Availability: 24/7 Discharge Plan Discussed with Primary Caregiver: Yes Is Caregiver In Agreement  with Plan?: Yes Does Caregiver/Family have Issues with Lodging/Transportation while Pt is in Rehab?: No   Goals Patient/Family Goal for Rehab: PT/OT: Mod I/Supervision Expected length of stay: 5-7 days Cultural Considerations: NA Pt/Family Agrees to Admission and willing to participate: Yes Program Orientation Provided & Reviewed with Pt/Caregiver Including Roles  & Responsibilities: Yes (pt and her daughter)  Barriers to Discharge: Home environment access/layout  Barriers to Discharge Comments: steps to enter   Decrease burden of Care  through IP rehab admission: NA   Possible need for SNF placement upon discharge:Not anticipated. Pt has good family support from her daughter and has Mod I/Supervision goals after a relatively short CIR stay.    Patient Condition: This patient's medical and functional status has changed since the consult dated: 08/06/19 in which the Rehabilitation Physician determined and documented that the patient's condition is appropriate for intensive rehabilitative care in an inpatient rehabilitation facility. Medical changes are: None-see H&P for details.  Functional changes are: slight increase in support during ambulation when not using AD, from 200 feet Min G/Supervision with RW to Min G/Min A 200 feet HHA. After evaluating the patient today and speaking with the Rehabilitation physician and acute team, the patient remains appropriate for inpatient rehab. Will admit to inpatient rehab today.  Preadmission Screen Completed By:  Raechel Ache, OT, 08/07/2019 1:07 PM ______________________________________________________________________   Discussed status with Dr. Ranell Patrick on 08/07/19 at 1:07PM and received approval for admission today.  Admission Coordinator:  Raechel Ache, time 1:07PM/Date 08/07/19

## 2019-08-06 NOTE — Progress Notes (Signed)
Physical Therapy Treatment Patient Details Name: Jodi [name] MRN: [id number] DOB: Nov 20, 1932 Today's Date: 08/06/2019    History of Present Illness 86 y.o. female with a recent cerebellar stroke on June 6, discharged from [facility] on June 11, started on Plavix and Lipitor, who presented as code stroke to the [facility] ED this afternoon. She had reportedly been acting normal all day with her daughter and then became confused, using inappropriate words and not acting normally. CTA head and neck revealed R M2 occlusion. Pt underwent cerebral angiogram and thrombectomy on 6/17.    PT Comments    Patient seen for mobility progression. This session focused on gait training without use of AD. Pt with impaired balance and with increased instability with challenges. Given pt's current mobility level pt will likely be able to discharge at mod I level with CIR level therapies.  PT will continue to follow acutely.     Follow Up Recommendations  CIR     Equipment Recommendations  None recommended by PT    Recommendations for Other Services Rehab consult     Precautions / Restrictions Precautions Precautions: Fall Restrictions Weight Bearing Restrictions: No    Mobility  Bed Mobility Overal bed mobility: Modified Independent Bed Mobility: Supine to Sit              Transfers Overall transfer level: Needs assistance Equipment used: None Transfers: Sit to/from Stand Sit to Stand: Min guard         General transfer comment: min guard for safety   Ambulation/Gait Ambulation/Gait assistance: Min guard;Min assist Gait Distance (Feet): 200 Feet Assistive device: 1 person hand held assist (assist at trunk with gait belt) Gait Pattern/deviations: Step-through pattern;Decreased stride length;Narrow base of support;Staggering right Gait velocity: reduced   General Gait Details: initially with shuffling gait pattern and able to improved stride length and cadence with verbal  cues; assistance required at trunk with gait belt and then with gait belt and HHA +1 on return to room due to impaired balance without use of AD; pt staggers to R and with near LOB with L horizontal head turns   Stairs             Wheelchair Mobility    Modified Rankin (Stroke Patients Only) Modified Rankin (Stroke Patients Only) Pre-Morbid Rankin Score: No symptoms Modified Rankin: Moderately severe disability     Balance Overall balance assessment: Needs assistance Sitting-balance support: No upper extremity supported;Feet supported Sitting balance-Leahy Scale: Good     Standing balance support: Single extremity supported;During functional activity Standing balance-Leahy Scale: Poor                              Cognition Arousal/Alertness: Awake/alert Behavior During Therapy: WFL for tasks assessed/performed Overall Cognitive Status: Within Functional Limits for tasks assessed                                        Exercises      General Comments        Pertinent Vitals/Pain Pain Assessment: No/denies pain    Home Living                      Prior Function            PT Goals (current goals can now be found in the care plan section)  Progress towards PT goals: Progressing toward goals    Frequency    Min 4X/week      PT Plan Discharge plan needs to be updated    Co-evaluation              AM-PAC PT "6 Clicks" Mobility   Outcome Measure  Help needed turning from your back to your side while in a flat bed without using bedrails?: None Help needed moving from lying on your back to sitting on the side of a flat bed without using bedrails?: None Help needed moving to and from a bed to a chair (including a wheelchair)?: None Help needed standing up from a chair using your arms (e.g., wheelchair or bedside chair)?: A Little Help needed to walk in hospital room?: A Little Help needed climbing 3-5 steps  with a railing? : A Little 6 Click Score: 21    End of Session Equipment Utilized During Treatment: Gait belt Activity Tolerance: Patient tolerated treatment well Patient left: with call bell/phone within reach;with family/visitor present;in bed;Other (comment) (pt sitting EOB with daughter present ) Nurse Communication: Mobility status (requesting bath--NT aware) PT Visit Diagnosis: Unsteadiness on feet (R26.81);Other abnormalities of gait and mobility (R26.89);Muscle weakness (generalized) (M62.81);Other symptoms and signs involving the nervous system (R29.898)     Time: 1540-1550 PT Time Calculation (min) (ACUTE ONLY): 10 min  Charges:  $Gait Training: 8-22 mins                     Earney Navy, PTA Acute Rehabilitation Services Pager: [phone] Office: [phone]     Darliss Cheney 08/06/2019, 5:51 PM

## 2019-08-07 ENCOUNTER — Inpatient Hospital Stay (HOSPITAL_COMMUNITY)
Admission: RE | Admit: 2019-08-07 | Discharge: 2019-08-10 | DRG: 057 | Disposition: A | Discharge status: Home Health | Payer: MEDICARE | Source: Intra-hospital | Attending: Physical Medicine & Rehabilitation | Admitting: Physical Medicine & Rehabilitation

## 2019-08-07 ENCOUNTER — Other Ambulatory Visit: Payer: Self-pay

## 2019-08-07 ENCOUNTER — Inpatient Hospital Stay (HOSPITAL_COMMUNITY): Payer: MEDICARE

## 2019-08-07 DIAGNOSIS — E785 Hyperlipidemia, unspecified: Secondary | ICD-10-CM | POA: Diagnosis present

## 2019-08-07 DIAGNOSIS — I10 Essential (primary) hypertension: Secondary | ICD-10-CM | POA: Diagnosis present

## 2019-08-07 DIAGNOSIS — N183 Chronic kidney disease, stage 3 unspecified: Secondary | ICD-10-CM | POA: Diagnosis present

## 2019-08-07 DIAGNOSIS — I480 Paroxysmal atrial fibrillation: Secondary | ICD-10-CM | POA: Diagnosis present

## 2019-08-07 DIAGNOSIS — Z8042 Family history of malignant neoplasm of prostate: Secondary | ICD-10-CM

## 2019-08-07 DIAGNOSIS — K5900 Constipation, unspecified: Secondary | ICD-10-CM | POA: Diagnosis present

## 2019-08-07 DIAGNOSIS — I63511 Cerebral infarction due to unspecified occlusion or stenosis of right middle cerebral artery: Secondary | ICD-10-CM | POA: Diagnosis not present

## 2019-08-07 DIAGNOSIS — E440 Moderate protein-calorie malnutrition: Secondary | ICD-10-CM | POA: Diagnosis present

## 2019-08-07 DIAGNOSIS — Z885 Allergy status to narcotic agent status: Secondary | ICD-10-CM

## 2019-08-07 DIAGNOSIS — I69398 Other sequelae of cerebral infarction: Principal | ICD-10-CM

## 2019-08-07 DIAGNOSIS — R413 Other amnesia: Secondary | ICD-10-CM | POA: Diagnosis present

## 2019-08-07 DIAGNOSIS — E46 Unspecified protein-calorie malnutrition: Secondary | ICD-10-CM

## 2019-08-07 DIAGNOSIS — Z8673 Personal history of transient ischemic attack (TIA), and cerebral infarction without residual deficits: Secondary | ICD-10-CM

## 2019-08-07 DIAGNOSIS — K5901 Slow transit constipation: Secondary | ICD-10-CM | POA: Diagnosis not present

## 2019-08-07 DIAGNOSIS — E8809 Other disorders of plasma-protein metabolism, not elsewhere classified: Secondary | ICD-10-CM | POA: Diagnosis present

## 2019-08-07 DIAGNOSIS — R2689 Other abnormalities of gait and mobility: Secondary | ICD-10-CM | POA: Diagnosis present

## 2019-08-07 DIAGNOSIS — N1832 Chronic kidney disease, stage 3b: Secondary | ICD-10-CM | POA: Diagnosis present

## 2019-08-07 DIAGNOSIS — Z85028 Personal history of other malignant neoplasm of stomach: Secondary | ICD-10-CM

## 2019-08-07 DIAGNOSIS — I2510 Atherosclerotic heart disease of native coronary artery without angina pectoris: Secondary | ICD-10-CM | POA: Diagnosis present

## 2019-08-07 DIAGNOSIS — I129 Hypertensive chronic kidney disease with stage 1 through stage 4 chronic kidney disease, or unspecified chronic kidney disease: Secondary | ICD-10-CM | POA: Diagnosis present

## 2019-08-07 DIAGNOSIS — Z79899 Other long term (current) drug therapy: Secondary | ICD-10-CM | POA: Diagnosis not present

## 2019-08-07 DIAGNOSIS — Z7901 Long term (current) use of anticoagulants: Secondary | ICD-10-CM

## 2019-08-07 DIAGNOSIS — D62 Acute posthemorrhagic anemia: Secondary | ICD-10-CM | POA: Diagnosis present

## 2019-08-07 DIAGNOSIS — I4891 Unspecified atrial fibrillation: Secondary | ICD-10-CM | POA: Diagnosis present

## 2019-08-07 DIAGNOSIS — Z888 Allergy status to other drugs, medicaments and biological substances status: Secondary | ICD-10-CM | POA: Diagnosis not present

## 2019-08-07 DIAGNOSIS — Z6828 Body mass index (BMI) 28.0-28.9, adult: Secondary | ICD-10-CM | POA: Diagnosis not present

## 2019-08-07 DIAGNOSIS — R0989 Other specified symptoms and signs involving the circulatory and respiratory systems: Secondary | ICD-10-CM

## 2019-08-07 LAB — CBC
HCT: 35.5 % — ABNORMAL LOW (ref 36.0–46.0)
Hemoglobin: 11.4 g/dL — ABNORMAL LOW (ref 12.0–15.0)
MCH: 29.2 pg (ref 26.0–34.0)
MCHC: 32.1 g/dL (ref 30.0–36.0)
MCV: 90.8 fL (ref 80.0–100.0)
Platelets: 228 10*3/uL (ref 150–400)
RBC: 3.91 MIL/uL (ref 3.87–5.11)
RDW: 14.3 % (ref 11.5–15.5)
WBC: 5.3 10*3/uL (ref 4.0–10.5)
nRBC: 0 % (ref 0.0–0.2)

## 2019-08-07 LAB — HEMOGLOBIN A1C
Hgb A1c MFr Bld: 5.6 % (ref 4.8–5.6)
Mean Plasma Glucose: 114.02 mg/dL

## 2019-08-07 LAB — BASIC METABOLIC PANEL
Anion gap: 8 (ref 5–15)
BUN: 7 mg/dL — ABNORMAL LOW (ref 8–23)
CO2: 25 mmol/L (ref 22–32)
Calcium: 9.1 mg/dL (ref 8.9–10.3)
Chloride: 107 mmol/L (ref 98–111)
Creatinine, Ser: 1.19 mg/dL — ABNORMAL HIGH (ref 0.44–1.00)
GFR calc Af Amer: 48 mL/min — ABNORMAL LOW (ref >60)
GFR calc non Af Amer: 42 mL/min — ABNORMAL LOW (ref >60)
Glucose, Bld: 92 mg/dL (ref 70–99)
Potassium: 3.7 mmol/L (ref 3.5–5.1)
Sodium: 140 mmol/L (ref 135–145)

## 2019-08-07 IMAGING — CR DG ABDOMEN 1V
1 series · 1 of 1 positions shown · non-contrast
Comparison: None.

CLINICAL DATA: Constipation

EXAM:
ABDOMEN - 1 VIEW

[abdomen kub]
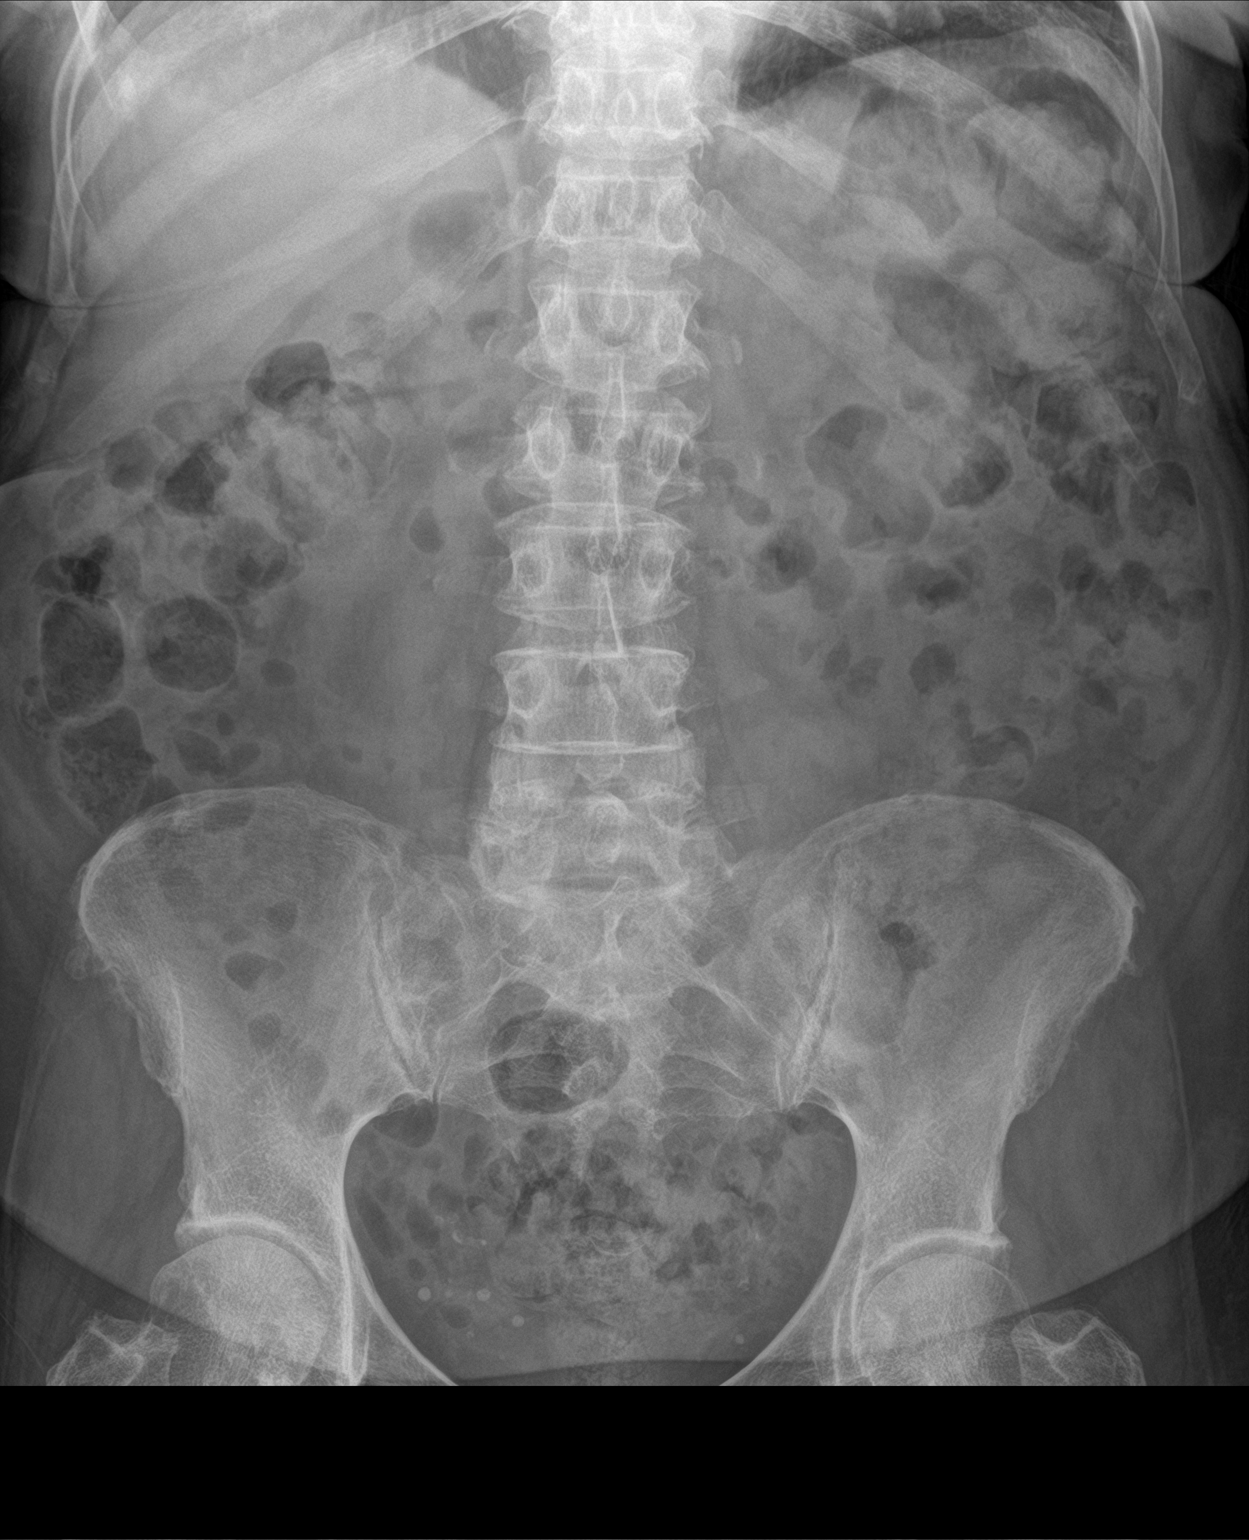

[1 of 1 positions shown; findings below may reference images not displayed]

FINDINGS: Nonobstructed gas pattern with moderate stool in the colon.
Phleboliths in the pelvis. No radiopaque calculi over the kidneys.
IMPRESSION: Negative.  Moderate stool in the colon

## 2019-08-07 MED ORDER — ALBUTEROL SULFATE HFA 108 (90 BASE) MCG/ACT IN AERS: 2.0000 | INHALATION_SPRAY | Freq: Four times a day (QID) | RESPIRATORY_TRACT | Status: DC | PRN

## 2019-08-07 MED ORDER — METOPROLOL TARTRATE 25 MG PO TABS
25.0000 mg | ORAL_TABLET | Freq: Two times a day (BID) | ORAL | Status: DC
  Administered 2019-08-07 – 2019-08-10 (×6): 25 mg via ORAL
  Filled 2019-08-07 (×6): qty 1

## 2019-08-07 MED ORDER — APIXABAN 5 MG PO TABS: 5.0000 mg | ORAL_TABLET | Freq: Two times a day (BID) | ORAL | Status: DC

## 2019-08-07 MED ORDER — SODIUM CHLORIDE 0.9% FLUSH: 3.0000 mL | Freq: Once | INTRAVENOUS | 0 refills | Status: DC

## 2019-08-07 MED ORDER — ALBUTEROL SULFATE (2.5 MG/3ML) 0.083% IN NEBU: 2.5000 mg | INHALATION_SOLUTION | Freq: Four times a day (QID) | RESPIRATORY_TRACT | Status: DC | PRN

## 2019-08-07 MED ORDER — WHITE PETROLATUM EX OINT: 1.0000 "application " | TOPICAL_OINTMENT | CUTANEOUS | 0 refills | Status: DC | PRN

## 2019-08-07 MED ORDER — ACETAMINOPHEN 650 MG RE SUPP: 650.0000 mg | RECTAL | Status: DC | PRN

## 2019-08-07 MED ORDER — VERAPAMIL HCL 80 MG PO TABS
160.0000 mg | ORAL_TABLET | Freq: Two times a day (BID) | ORAL | Status: DC
  Administered 2019-08-07 – 2019-08-10 (×6): 160 mg via ORAL
  Filled 2019-08-07 (×6): qty 2

## 2019-08-07 MED ORDER — METOPROLOL TARTRATE 25 MG PO TABS: 25.0000 mg | ORAL_TABLET | Freq: Two times a day (BID) | ORAL | Status: DC

## 2019-08-07 MED ORDER — POLYETHYLENE GLYCOL 3350 17 G PO PACK: 17.0000 g | PACK | Freq: Every day | ORAL | 0 refills | Status: AC

## 2019-08-07 MED ORDER — ATORVASTATIN CALCIUM 40 MG PO TABS
40.0000 mg | ORAL_TABLET | Freq: Every day | ORAL | Status: DC
  Administered 2019-08-07 – 2019-08-09 (×3): 40 mg via ORAL
  Filled 2019-08-07 (×3): qty 1

## 2019-08-07 MED ORDER — ACETAMINOPHEN 160 MG/5ML PO SOLN: 650.0000 mg | ORAL | Status: DC | PRN

## 2019-08-07 MED ORDER — POLYETHYLENE GLYCOL 3350 17 G PO PACK
17.0000 g | PACK | Freq: Every day | ORAL | Status: DC
  Administered 2019-08-08: 17 g via ORAL
  Filled 2019-08-07: qty 1

## 2019-08-07 MED ORDER — FUROSEMIDE 20 MG PO TABS
20.0000 mg | ORAL_TABLET | Freq: Every day | ORAL | Status: DC
  Administered 2019-08-08 – 2019-08-10 (×3): 20 mg via ORAL
  Filled 2019-08-07 (×3): qty 1

## 2019-08-07 MED ORDER — SENNOSIDES-DOCUSATE SODIUM 8.6-50 MG PO TABS: 1.0000 | ORAL_TABLET | Freq: Every evening | ORAL | Status: DC | PRN

## 2019-08-07 MED ORDER — ACETAMINOPHEN 325 MG PO TABS: 650.0000 mg | ORAL_TABLET | ORAL | Status: DC | PRN

## 2019-08-07 MED ORDER — WHITE PETROLATUM EX OINT: 1.0000 "application " | TOPICAL_OINTMENT | CUTANEOUS | Status: DC | PRN

## 2019-08-07 MED ORDER — VITAMIN D 25 MCG (1000 UNIT) PO TABS
2000.0000 [IU] | ORAL_TABLET | Freq: Every day | ORAL | Status: DC
  Administered 2019-08-07 – 2019-08-10 (×4): 2000 [IU] via ORAL
  Filled 2019-08-07 (×4): qty 2

## 2019-08-07 MED ORDER — SORBITOL 70 % SOLN: 30.0000 mL | Freq: Every day | Status: DC | PRN

## 2019-08-07 MED ORDER — CHLORHEXIDINE GLUCONATE CLOTH 2 % EX PADS: 6.0000 | MEDICATED_PAD | Freq: Every day | CUTANEOUS | Status: DC

## 2019-08-07 MED ORDER — APIXABAN 5 MG PO TABS
5.0000 mg | ORAL_TABLET | Freq: Two times a day (BID) | ORAL | Status: DC
  Administered 2019-08-07 – 2019-08-10 (×6): 5 mg via ORAL
  Filled 2019-08-07 (×6): qty 1

## 2019-08-07 NOTE — TOC Transition Note (Signed)
Transition of Care [facility]) - CM/SW Discharge Note   Patient Details  Name: Jodi [name] MRN: [id number] Date of Birth: Jan 19, 1933  Transition of Care [facility] CM/SW Contact:  Pollie Friar, RN Phone Number: 08/07/2019, 2:26 PM   Clinical Narrative:    Pt discharging to CIR today. CM signing off.    Final next level of care: IP Rehab Facility Barriers to Discharge: No Barriers Identified   Patient Goals and CMS Choice        Discharge Placement                       Discharge Plan and Services                                     Social Determinants of Health (SDOH) Interventions     Readmission Risk Interventions No flowsheet data found.

## 2019-08-07 NOTE — Discharge Summary (Addendum)
Stroke Discharge Summary  Patient ID: Zamantha Strebel   MRN: [id number]      DOB: 1932-07-10  Date of Admission: 08/02/2019 Date of Discharge: 08/07/2019  Attending Physician:  Garvin Fila, MD, Stroke MD Consultant(s):   Pedro Earls MD (Interventional Neuroradiologist), Alysia Penna, MD (Physical Medicine & Rehabilitation)  Patient's PCP:  Chase Picket, MD  Discharge Diagnoses:  Principal Problem:   Acute cardioembolic stroke ([place]) d/t AF, s/p clot retrieval,  Active Problems:   Atrial fibrillation ([place])   History of embolic stroke   Essential hypertension   Hyperlipidemia   CKD (chronic kidney disease), stage IIIb   Medications to be continued on Rehab Allergies as of 08/07/2019       Reactions   Aciphex [rabeprazole]    Hives   Aspirin    Anaphylaxis   Hydralazine    Nausea   Morphine And Related    Emesis        Medication List     STOP taking these medications    clopidogrel 75 MG tablet Commonly known as: PLAVIX   hydrALAZINE 25 MG tablet Commonly known as: APRESOLINE   lisinopril 20 MG tablet Commonly known as: ZESTRIL   meclizine 12.5 MG tablet Commonly known as: ANTIVERT       TAKE these medications    albuterol 108 (90 Base) MCG/ACT inhaler Commonly known as: VENTOLIN HFA Inhale 2 puffs into the lungs every 4 (four) hours as needed for wheezing or shortness of breath.   apixaban 5 MG Tabs tablet Commonly known as: ELIQUIS Take 1 tablet (5 mg total) by mouth 2 (two) times daily.   atorvastatin 40 MG tablet Commonly known as: LIPITOR Take 40 mg by mouth at bedtime.   Chlorhexidine Gluconate Cloth 2 % Pads Apply 6 each topically daily. Start taking on: August 08, 2019   furosemide 20 MG tablet Commonly known as: LASIX Take 20 mg by mouth daily.   metoprolol tartrate 25 MG tablet Commonly known as: LOPRESSOR Take 1 tablet (25 mg total) by mouth 2 (two) times daily. What changed:  medication  strength how much to take   polyethylene glycol 17 g packet Commonly known as: MIRALAX / GLYCOLAX Take 17 g by mouth daily. Start taking on: August 08, 2019   senna 8.6 MG tablet Commonly known as: SENOKOT Take 1 tablet by mouth daily as needed for constipation.   sodium chloride flush 0.9 % Soln Commonly known as: NS Inject 3 mLs into the vein once for 1 dose.   triamcinolone 0.1 % paste Commonly known as: KENALOG Use as directed 1 application in the mouth or throat 2 (two) times daily.   verapamil 80 MG tablet Commonly known as: CALAN Take 160 mg by mouth 2 (two) times daily.   Vitamin D 50 MCG (2000 UT) Caps Take 1 capsule by mouth daily.   white petrolatum Oint Commonly known as: VASELINE Apply 1 application topically as needed for lip care.        LABORATORY STUDIES CBC    Component Value Date/Time   WBC 5.3 08/07/2019 0551   RBC 3.91 08/07/2019 0551   HGB 11.4 (L) 08/07/2019 0551   HCT 35.5 (L) 08/07/2019 0551   PLT 228 08/07/2019 0551   MCV 90.8 08/07/2019 0551   MCH 29.2 08/07/2019 0551   MCHC 32.1 08/07/2019 0551   RDW 14.3 08/07/2019 0551   LYMPHSABS 1.5 08/02/2019 1730   MONOABS 0.7 08/02/2019 1730   EOSABS 0.1  08/02/2019 1730   BASOSABS 0.1 08/02/2019 1730   CMP    Component Value Date/Time   NA 140 08/07/2019 0551   K 3.7 08/07/2019 0551   CL 107 08/07/2019 0551   CO2 25 08/07/2019 0551   GLUCOSE 92 08/07/2019 0551   BUN 7 (L) 08/07/2019 0551   CREATININE 1.19 (H) 08/07/2019 0551   CALCIUM 9.1 08/07/2019 0551   PROT 7.4 08/02/2019 1602   ALBUMIN 4.1 08/02/2019 1602   AST 28 08/02/2019 1602   ALT 18 08/02/2019 1602   ALKPHOS 85 08/02/2019 1602   BILITOT 0.5 08/02/2019 1602   GFRNONAA 42 (L) 08/07/2019 0551   GFRAA 48 (L) 08/07/2019 0551   COAGS Lab Results  Component Value Date   INR 1.0 08/02/2019   Lipid Panel    Component Value Date/Time   CHOL 144 08/03/2019 0618   TRIG 53 08/03/2019 0618   HDL 78 08/03/2019 0618    CHOLHDL 1.8 08/03/2019 0618   VLDL 11 08/03/2019 0618   LDLCALC 55 08/03/2019 0618   HgbA1C  Lab Results  Component Value Date   HGBA1C 5.6 08/07/2019   Urinalysis    Component Value Date/Time   COLORURINE STRAW (A) 08/02/2019 1818   APPEARANCEUR CLEAR 08/02/2019 1818   LABSPEC 1.009 08/02/2019 1818   PHURINE 8.0 08/02/2019 1818   GLUCOSEU NEGATIVE 08/02/2019 1818   HGBUR NEGATIVE 08/02/2019 1818   BILIRUBINUR NEGATIVE 08/02/2019 1818   KETONESUR NEGATIVE 08/02/2019 1818   PROTEINUR NEGATIVE 08/02/2019 1818   NITRITE NEGATIVE 08/02/2019 1818   LEUKOCYTESUR NEGATIVE 08/02/2019 1818    SIGNIFICANT DIAGNOSTIC STUDIES CT Angio Head W or Wo Contrast  Result Date: 08/02/2019 CLINICAL DATA:  Confusion.  Abnormal head CT. EXAM: CT ANGIOGRAPHY HEAD AND NECK TECHNIQUE: Multidetector CT imaging of the head and neck was performed using the standard protocol during bolus administration of intravenous contrast. Multiplanar CT image reconstructions and MIPs were obtained to evaluate the vascular anatomy. Carotid stenosis measurements (when applicable) are obtained utilizing NASCET criteria, using the distal internal carotid diameter as the denominator. CONTRAST:  138m OMNIPAQUE IOHEXOL 350 MG/ML SOLN COMPARISON:  Head CT earlier same day FINDINGS: CTA NECK FINDINGS Aortic arch: Aortic atherosclerosis. No aneurysm or dissection. Branching pattern is normal without origin stenosis. Right carotid system: Common carotid artery widely patent to the bifurcation. Carotid bifurcation is normal without atherosclerotic disease or stenosis. Cervical ICA is widely patent. Left carotid system: Common carotid artery widely patent to the bifurcation. Carotid bifurcation is normal. Cervical ICA is normal. Vertebral arteries: Both vertebral artery origins are widely patent. The left is dominant. Both vertebral arteries are patent through the cervical region to the foramen magnum. Skeleton: Ordinary cervical  spondylosis. Other neck: No mass or lymphadenopathy. Upper chest: Normal Review of the MIP images confirms the above findings CTA HEAD FINDINGS Anterior circulation: Both internal carotid arteries widely patent through the skull base and siphon regions. The anterior and middle cerebral vessels are patent proximally. There is occlusion of a proximal right M2 branch vessel. No other anterior circulation finding. No aneurysm or vascular malformation. Posterior circulation: Both vertebral arteries are patent through the foramen magnum to the basilar. Patent posterior communicating arteries on each side. No basilar stenosis. Superior cerebellar and posterior cerebral arteries show flow. Primary anterior circulation supply of the posterior cerebral arteries. Venous sinuses: Patent and normal. Anatomic variants: None significant. Review of the MIP images confirms the above findings IMPRESSION: Occlusion of a right proximal M2 branch. No other abnormal anterior circulation finding.  Posterior circulation intracranial branch vessels are intact. No carotid bifurcation disease.  No vertebral artery origin disease. Minimal aortic atherosclerosis. These results were called by telephone at the time of interpretation on 08/02/2019 at 6:24 pm to Dr. Marcy Salvo verbally acknowledged these results. Electronically Signed   By: Nelson Chimes M.D.   On: 08/02/2019 18:28   CT Angio Neck W and/or Wo Contrast  Result Date: 08/02/2019 CLINICAL DATA:  Confusion.  Abnormal head CT. EXAM: CT ANGIOGRAPHY HEAD AND NECK TECHNIQUE: Multidetector CT imaging of the head and neck was performed using the standard protocol during bolus administration of intravenous contrast. Multiplanar CT image reconstructions and MIPs were obtained to evaluate the vascular anatomy. Carotid stenosis measurements (when applicable) are obtained utilizing NASCET criteria, using the distal internal carotid diameter as the denominator. CONTRAST:  181m OMNIPAQUE IOHEXOL 350  MG/ML SOLN COMPARISON:  Head CT earlier same day FINDINGS: CTA NECK FINDINGS Aortic arch: Aortic atherosclerosis. No aneurysm or dissection. Branching pattern is normal without origin stenosis. Right carotid system: Common carotid artery widely patent to the bifurcation. Carotid bifurcation is normal without atherosclerotic disease or stenosis. Cervical ICA is widely patent. Left carotid system: Common carotid artery widely patent to the bifurcation. Carotid bifurcation is normal. Cervical ICA is normal. Vertebral arteries: Both vertebral artery origins are widely patent. The left is dominant. Both vertebral arteries are patent through the cervical region to the foramen magnum. Skeleton: Ordinary cervical spondylosis. Other neck: No mass or lymphadenopathy. Upper chest: Normal Review of the MIP images confirms the above findings CTA HEAD FINDINGS Anterior circulation: Both internal carotid arteries widely patent through the skull base and siphon regions. The anterior and middle cerebral vessels are patent proximally. There is occlusion of a proximal right M2 branch vessel. No other anterior circulation finding. No aneurysm or vascular malformation. Posterior circulation: Both vertebral arteries are patent through the foramen magnum to the basilar. Patent posterior communicating arteries on each side. No basilar stenosis. Superior cerebellar and posterior cerebral arteries show flow. Primary anterior circulation supply of the posterior cerebral arteries. Venous sinuses: Patent and normal. Anatomic variants: None significant. Review of the MIP images confirms the above findings IMPRESSION: Occlusion of a right proximal M2 branch. No other abnormal anterior circulation finding. Posterior circulation intracranial branch vessels are intact. No carotid bifurcation disease.  No vertebral artery origin disease. Minimal aortic atherosclerosis. These results were called by telephone at the time of interpretation on 08/02/2019  at 6:24 pm to Dr. YMarcy Salvoverbally acknowledged these results. Electronically Signed   By: MNelson ChimesM.D.   On: 08/02/2019 18:28   MR ANGIO HEAD WO CONTRAST  Result Date: 08/03/2019 CLINICAL DATA:  86year old female code stroke presentation yesterday with right MCA M2 ELVO status post endovascular recanalization. History of atrial fibrillation. EXAM: MRI HEAD WITHOUT CONTRAST MRA HEAD WITHOUT CONTRAST TECHNIQUE: Multiplanar, multiecho pulse sequences of the brain and surrounding structures were obtained without intravenous contrast. Angiographic images of the head were obtained using MRA technique without contrast. COMPARISON:  CT head, CTA head and neck yesterday. FINDINGS: MRI HEAD FINDINGS Brain: Linear restricted diffusion from the posterior right corona radiata to the posterior right lentiform (series 7, image 54). Superimposed scattered, generally punctate foci of cortical and white matter restricted diffusion in the right hemisphere, including in the right occipital lobe (series 5, image 74). Furthermore, there are also occasional punctate foci of restricted diffusion in the left hemisphere (series 5, image 87) and also the right cerebellum. No hemorrhage or mass effect associated with  the acute ischemia. 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 Minimal chronic microhemorrhage in the hemispheres. Moderate patchy bilateral cerebral white matter T2 and FLAIR hyperintensity. No restricted midline shift, mass effect, evidence of mass lesion, ventriculomegaly, extra-axial collection. Cervicomedullary junction and pituitary are within normal limits. Vascular: Major intracranial vascular flow voids are preserved. See MRA findings below. Skull and upper cervical spine: Negative visible cervical spine, bone marrow signal. Sinuses/Orbits: Negative orbits. Paranasal sinuses remain well pneumatized. Other: Mild right mastoid effusion. Negative visible nasopharynx. Scalp and face soft  tissues appear negative. MRA HEAD FINDINGS Antegrade flow in the posterior circulation with dominant left vertebral artery again noted. Patent PICA origins. No distal vertebral stenosis. Patent vertebrobasilar junction. Patent basilar artery without stenosis. Patent SCA origins. Fetal bilateral PCA origins. Bilateral PCA branches are within normal limits. Antegrade flow in both ICA siphons. No siphon stenosis. Normal ophthalmic and posterior communicating artery origins. Patent carotid termini, MCA and ACA origins. Tortuous right A1. Visible ACA branches are stable and within normal limits. Left MCA M1 and bifurcation remain patent without stenosis. Visible left MCA branches are stable and within normal limits. Right MCA M1 and bifurcation are patent without stenosis. The recently abnormal posterior right M2 branch remains recanalized (series 9, image 114 and series 1078 image 14). Visible right MCA branches now are within normal limits. IMPRESSION: 1. Positive for relatively widely scattered but generally punctate infarcts in the right hemisphere, the largest tracks from the posterior right corona radiata to the lentiform. Superimposed occasional punctate infarcts also in the left hemisphere and cerebellum. This pattern suggests recent embolic event in the setting of atrial fibrillation. No associated acute hemorrhage or mass effect. 2. Intracranial MRA is negative today, with normalized Right MCA M2 branches. Also note bilateral fetal PCA origins. 3. 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 Electronically Signed   By: Genevie Ann M.D.   On: 08/03/2019 11:36   MR BRAIN WO CONTRAST  Result Date: 08/03/2019 CLINICAL DATA:  86 year old female code stroke presentation yesterday with right MCA M2 ELVO status post endovascular recanalization. History of atrial fibrillation. EXAM: MRI HEAD WITHOUT CONTRAST MRA HEAD WITHOUT CONTRAST TECHNIQUE: Multiplanar, multiecho pulse sequences of the brain and  surrounding structures were obtained without intravenous contrast. Angiographic images of the head were obtained using MRA technique without contrast. COMPARISON:  CT head, CTA head and neck yesterday. FINDINGS: MRI HEAD FINDINGS Brain: Linear restricted diffusion from the posterior right corona radiata to the posterior right lentiform (series 7, image 54). Superimposed scattered, generally punctate foci of cortical and white matter restricted diffusion in the right hemisphere, including in the right occipital lobe (series 5, image 74). Furthermore, there are also occasional punctate foci of restricted diffusion in the left hemisphere (series 5, image 87) and also the right cerebellum. No hemorrhage or mass effect associated with the acute ischemia. 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 Minimal chronic microhemorrhage in the hemispheres. Moderate patchy bilateral cerebral white matter T2 and FLAIR hyperintensity. No restricted midline shift, mass effect, evidence of mass lesion, ventriculomegaly, extra-axial collection. Cervicomedullary junction and pituitary are within normal limits. Vascular: Major intracranial vascular flow voids are preserved. See MRA findings below. Skull and upper cervical spine: Negative visible cervical spine, bone marrow signal. Sinuses/Orbits: Negative orbits. Paranasal sinuses remain well pneumatized. Other: Mild right mastoid effusion. Negative visible nasopharynx. Scalp and face soft tissues appear negative. MRA HEAD FINDINGS Antegrade flow in the posterior circulation with dominant left vertebral artery again noted. Patent PICA origins.  No distal vertebral stenosis. Patent vertebrobasilar junction. Patent basilar artery without stenosis. Patent SCA origins. Fetal bilateral PCA origins. Bilateral PCA branches are within normal limits. Antegrade flow in both ICA siphons. No siphon stenosis. Normal ophthalmic and posterior communicating artery origins. Patent  carotid termini, MCA and ACA origins. Tortuous right A1. Visible ACA branches are stable and within normal limits. Left MCA M1 and bifurcation remain patent without stenosis. Visible left MCA branches are stable and within normal limits. Right MCA M1 and bifurcation are patent without stenosis. The recently abnormal posterior right M2 branch remains recanalized (series 9, image 114 and series 1078 image 14). Visible right MCA branches now are within normal limits. IMPRESSION: 1. Positive for relatively widely scattered but generally punctate infarcts in the right hemisphere, the largest tracks from the posterior right corona radiata to the lentiform. Superimposed occasional punctate infarcts also in the left hemisphere and cerebellum. This pattern suggests recent embolic event in the setting of atrial fibrillation. No associated acute hemorrhage or mass effect. 2. Intracranial MRA is negative today, with normalized Right MCA M2 branches. Also note bilateral fetal PCA origins. 3. 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 Electronically Signed   By: Genevie Ann M.D.   On: 08/03/2019 11:36   IR CT Head Ltd  Result Date: 08/03/2019 INDICATION: 86 year old female with past medical history significant for recent cerebellar stroke, atrial fibrillation, hypertension and GI cancer. She presented to an outside hospital with confusion and aphasia, NIHSS 5. Head CT showed no acute right MCA territory infarct with known left cerebellar stroke. CT angiogram of the head and neck showed a right M2/MCA occlusion. She was taken to our service for an emergency diagnostic cerebral angiogram and mechanical thrombectomy. EXAM: Diagnostic cerebral angiogram and mechanical thrombectomy. COMPARISON:  CT/CT angiogram of the head and neck August 02, 2019 MEDICATIONS: Ancef 2 gm IV. The antibiotic was administered within 1 hour of the procedure ANESTHESIA/SEDATION: The procedure was performed in the general anesthesia. FLUOROSCOPY  TIME:  Fluoroscopy Time: 11 minutes (360.9 mGy). COMPLICATIONS: None immediate. TECHNIQUE: Informed written consent was obtained from the patient's daughter after a thorough discussion of the procedural risks, benefits and alternatives. All questions were addressed. Maximal Sterile Barrier Technique was utilized including caps, mask, sterile gowns, sterile gloves, sterile drape, hand hygiene and skin antiseptic. A timeout was performed prior to the initiation of the procedure. Real-time ultrasound guidance was utilized for vascular access including the acquisition of a permanent ultrasound image documenting patency of the accessed vessel. Using the modified Seldinger technique and a micropuncture kit, access was gained to the right radial artery at the wrist and a 7 French sheath was placed. Slow intra arterial infusion of 212 mcg nitroglicerin diluted in patient's own blood was performed. No significant fluctuation in patient's blood pressure seen. Then, a right radial artery roadmap was obtained via sheath side port. Normal brachial artery branching pattern seen. No significant anatomical variation. The right radial artery caliber is adequate for vascular access. Then, a 6 [place] Berenstein 2 catheter was navigated over a 0.035 inch a Terumo Glidewire into the right subclavian artery and then placed into the right common carotid artery. The wire was removed and a roadmap of the head and neck was obtained. Then, the catheter was exchanged over the wire for an infinity long sheath which was placed the cervical segment of the right ICA. Frontal and lateral angiograms of the head were obtained. FINDINGS: A subocclusive clot is seen within the proximal right M2/MCA posterior division  branch with an occlusive clot seen in in the mid M2 segment extending to the M3. PROCEDURE: Under biplane roadmap, coaxial navigation of a Zoom 55 aspiration catheter over a synchro support microguidewire into the right M2/MCA posterior  division branch. The wire was removed and aspiration catheter was connected to an aspiration pump. Continuous aspiration was performed for 4 minutes. The aspiration catheter was then retracted under continuous aspiration. The guiding catheter was aspirated and brisk flow was noted. Right ICA angiogram was obtained with magnified frontal and lateral view of the head showing complete recanalization of the right M2/MCA (TICI3). Frontal and lateral angiograms with views of the entire head showed no evidence of thromboembolic complication. Flat panel CT of the head was obtained and post processed in a separate workstation with concurrent attending physician supervision. Selected images were sent to PACS. No hemorrhagic complication noted. Hyperdensity seen in the medial aspect of the left inferior cerebellar hemisphere, in the location of known stroke, likely representing contrast staining. The catheter was subsequently withdrawn. IMPRESSION: 1. Successful mechanical thrombectomy for treatment of a right M2/MCA posterior division branch occlusion via right radial approach. A total of 1 pass with direct contact aspiration with complete recanalization (TICI3). 2. No embolus to new or distal territory. 3. No hemorrhagic complication. PLAN: 1. ICU level of care. 2. SBP 120-140 mmHg. 3. Follow-up imaging within 24 hours. Electronically Signed   By: Pedro Earls M.D.   On: 08/03/2019 11:16   ECHOCARDIOGRAM COMPLETE  Result Date: 08/03/2019    ECHOCARDIOGRAM REPORT   Patient Name:   GAYLEEN SHOLTZ Date of Exam: 08/03/2019 Medical Rec #:  [id number]      Height:       63.0 in Accession #:    [id number]     Weight:       161.2 lb Date of Birth:  Jan 07, 1933       BSA:          1.764 m Patient Age:    65 years       BP:           144/59 mmHg Patient Gender: F              HR:           77 bpm. Exam Location:  Inpatient Procedure: 2D Echo, 3D Echo, Color Doppler and Cardiac Doppler Indications:    Stroke i163.9   History:        Patient has no prior history of Echocardiogram examinations.                 Arrythmias:Atrial Fibrillation; Risk Factors:Hypertension and                 Dyslipidemia. Prior performed at [facility].  Sonographer:    Raquel Sarna Senior RDCS Referring Phys: [place]  1. Left ventricular ejection fraction, by estimation, is 60 to 65%. The left ventricle has normal function. The left ventricle has no regional wall motion abnormalities. There is mild concentric left ventricular hypertrophy. Left ventricular diastolic parameters are consistent with Grade II diastolic dysfunction (pseudonormalization). Elevated left atrial pressure.  2. Right ventricular systolic function is normal. The right ventricular size is normal. There is mildly elevated pulmonary artery systolic pressure. The estimated right ventricular systolic pressure is 22.9 mmHg.  3. Left atrial size was mildly dilated.  4. The mitral valve is normal in structure. No evidence of mitral valve regurgitation. No evidence of mitral stenosis.  5. The aortic valve  is normal in structure. Aortic valve regurgitation is not visualized. No aortic stenosis is present.  6. The inferior vena cava is normal in size with greater than 50% respiratory variability, suggesting right atrial pressure of 3 mmHg. Comparison(s): Prior images unable to be directly viewed, comparison made by report only. Prior echo at [facility] 07/25/2019 showed similar findings. FINDINGS  Left Ventricle: Left ventricular ejection fraction, by estimation, is 60 to 65%. The left ventricle has normal function. The left ventricle has no regional wall motion abnormalities. The left ventricular internal cavity size was normal in size. There is  mild concentric left ventricular hypertrophy. Left ventricular diastolic parameters are consistent with Grade II diastolic dysfunction (pseudonormalization). Elevated left atrial pressure. Right Ventricle: The right ventricular size is normal. No  increase in right ventricular wall thickness. Right ventricular systolic function is normal. There is mildly elevated pulmonary artery systolic pressure. The tricuspid regurgitant velocity is 2.95  m/s, and with an assumed right atrial pressure of 3 mmHg, the estimated right ventricular systolic pressure is 79.0 mmHg. Left Atrium: Left atrial size was mildly dilated. Right Atrium: Right atrial size was normal in size. Pericardium: There is no evidence of pericardial effusion. Mitral Valve: The mitral valve is normal in structure. Normal mobility of the mitral valve leaflets. No evidence of mitral valve regurgitation. No evidence of mitral valve stenosis. MV peak gradient, 9.1 mmHg. The mean mitral valve gradient is 3.0 mmHg. Tricuspid Valve: The tricuspid valve is normal in structure. Tricuspid valve regurgitation is trivial. No evidence of tricuspid stenosis. Aortic Valve: The aortic valve is normal in structure. Aortic valve regurgitation is not visualized. No aortic stenosis is present. Pulmonic Valve: The pulmonic valve was normal in structure. Pulmonic valve regurgitation is not visualized. No evidence of pulmonic stenosis. Aorta: The aortic root is normal in size and structure. Venous: The inferior vena cava is normal in size with greater than 50% respiratory variability, suggesting right atrial pressure of 3 mmHg. IAS/Shunts: No atrial level shunt detected by color flow Doppler.  LEFT VENTRICLE PLAX 2D LVIDd:         3.60 cm  Diastology LVIDs:         2.20 cm  LV e' lateral:   7.72 cm/s LV PW:         1.30 cm  LV E/e' lateral: 16.7 LV IVS:        1.40 cm  LV e' medial:    6.74 cm/s LVOT diam:     2.00 cm  LV E/e' medial:  19.1 LV SV:         90 LV SV Index:   51 LVOT Area:     3.14 cm  RIGHT VENTRICLE RV S prime:     11.60 cm/s TAPSE (M-mode): 2.0 cm LEFT ATRIUM             Index       RIGHT ATRIUM           Index LA diam:        3.90 cm 2.21 cm/m  RA Area:     13.20 cm LA Vol (A2C):   63.9 ml 36.22 ml/m  RA Volume:   24.80 ml  14.06 ml/m LA Vol (A4C):   51.5 ml 29.19 ml/m LA Biplane Vol: 59.8 ml 33.90 ml/m  AORTIC VALVE LVOT Vmax:   135.00 cm/s LVOT Vmean:  93.300 cm/s LVOT VTI:    0.288 m  AORTA Ao Root diam: 3.00 cm Ao Asc diam:  3.20 cm  MITRAL VALVE                TRICUSPID VALVE MV Area (PHT): 3.37 cm     TR Peak grad:   34.8 mmHg MV Peak grad:  9.1 mmHg     TR Vmax:        295.00 cm/s MV Mean grad:  3.0 mmHg MV Vmax:       1.51 m/s     SHUNTS MV Vmean:      77.4 cm/s    Systemic VTI:  0.29 m MV Decel Time: 225 msec     Systemic Diam: 2.00 cm MV E velocity: 129.00 cm/s MV A velocity: 42.00 cm/s MV E/A ratio:  3.07 Mihai Croitoru MD Electronically signed by Sanda Klein MD Signature Date/Time: 08/03/2019/12:16:18 PM    Final    IR PERCUTANEOUS ART THROMBECTOMY/INFUSION INTRACRANIAL INC DIAG ANGIO  Result Date: 08/03/2019 INDICATION: 86 year old female with past medical history significant for recent cerebellar stroke, atrial fibrillation, hypertension and GI cancer. She presented to an outside hospital with confusion and aphasia, NIHSS 5. Head CT showed no acute right MCA territory infarct with known left cerebellar stroke. CT angiogram of the head and neck showed a right M2/MCA occlusion. She was taken to our service for an emergency diagnostic cerebral angiogram and mechanical thrombectomy. EXAM: Diagnostic cerebral angiogram and mechanical thrombectomy. COMPARISON:  CT/CT angiogram of the head and neck August 02, 2019 MEDICATIONS: Ancef 2 gm IV. The antibiotic was administered within 1 hour of the procedure ANESTHESIA/SEDATION: The procedure was performed in the general anesthesia. FLUOROSCOPY TIME:  Fluoroscopy Time: 11 minutes (360.9 mGy). COMPLICATIONS: None immediate. TECHNIQUE: Informed written consent was obtained from the patient's daughter after a thorough discussion of the procedural risks, benefits and alternatives. All questions were addressed. Maximal Sterile Barrier Technique was utilized  including caps, mask, sterile gowns, sterile gloves, sterile drape, hand hygiene and skin antiseptic. A timeout was performed prior to the initiation of the procedure. Real-time ultrasound guidance was utilized for vascular access including the acquisition of a permanent ultrasound image documenting patency of the accessed vessel. Using the modified Seldinger technique and a micropuncture kit, access was gained to the right radial artery at the wrist and a 7 French sheath was placed. Slow intra arterial infusion of 220 mcg nitroglicerin diluted in patient's own blood was performed. No significant fluctuation in patient's blood pressure seen. Then, a right radial artery roadmap was obtained via sheath side port. Normal brachial artery branching pattern seen. No significant anatomical variation. The right radial artery caliber is adequate for vascular access. Then, a 6 [place] Berenstein 2 catheter was navigated over a 0.035 inch a Terumo Glidewire into the right subclavian artery and then placed into the right common carotid artery. The wire was removed and a roadmap of the head and neck was obtained. Then, the catheter was exchanged over the wire for an infinity long sheath which was placed the cervical segment of the right ICA. Frontal and lateral angiograms of the head were obtained. FINDINGS: A subocclusive clot is seen within the proximal right M2/MCA posterior division branch with an occlusive clot seen in in the mid M2 segment extending to the M3. PROCEDURE: Under biplane roadmap, coaxial navigation of a Zoom 55 aspiration catheter over a synchro support microguidewire into the right M2/MCA posterior division branch. The wire was removed and aspiration catheter was connected to an aspiration pump. Continuous aspiration was performed for 4 minutes. The aspiration catheter was then retracted under continuous aspiration. The  guiding catheter was aspirated and brisk flow was noted. Right ICA angiogram was obtained  with magnified frontal and lateral view of the head showing complete recanalization of the right M2/MCA (TICI3). Frontal and lateral angiograms with views of the entire head showed no evidence of thromboembolic complication. Flat panel CT of the head was obtained and post processed in a separate workstation with concurrent attending physician supervision. Selected images were sent to PACS. No hemorrhagic complication noted. Hyperdensity seen in the medial aspect of the left inferior cerebellar hemisphere, in the location of known stroke, likely representing contrast staining. The catheter was subsequently withdrawn. IMPRESSION: 1. Successful mechanical thrombectomy for treatment of a right M2/MCA posterior division branch occlusion via right radial approach. A total of 1 pass with direct contact aspiration with complete recanalization (TICI3). 2. No embolus to new or distal territory. 3. No hemorrhagic complication. PLAN: 1. ICU level of care. 2. SBP 120-140 mmHg. 3. Follow-up imaging within 24 hours. Electronically Signed   By: Pedro Earls M.D.   On: 08/03/2019 11:16   CT HEAD CODE STROKE WO CONTRAST  Result Date: 08/02/2019 CLINICAL DATA:  Code stroke. Worsening confusion. Recent stroke treated at outside institution. EXAM: CT HEAD WITHOUT CONTRAST TECHNIQUE: Contiguous axial images were obtained from the base of the skull through the vertex without intravenous contrast. COMPARISON:  None. FINDINGS: Brain: Age related volume loss. Chronic small-vessel ischemic changes of the cerebral hemispheric white matter. Probable low-density in the left cerebellum that could be an acute or subacute infarction. No sign of hemorrhage, mass, hydrocephalus or extra-axial collection. Vascular: There is atherosclerotic calcification of the major vessels at the base of the brain. Skull: Negative Sinuses/Orbits: Clear/normal Other: None ASPECTS ([place] Stroke Program Early CT Score) - Ganglionic level  infarction (caudate, lentiform nuclei, internal capsule, insula, M1-M3 cortex): 7 - Supraganglionic infarction (M4-M6 cortex): 3 Total score (0-10 with 10 being normal): 10 IMPRESSION: 1. Chronic small-vessel ischemic changes of the cerebral hemispheric white matter. Probable acute or subacute infarction in the left cerebellar hemisphere. No mass effect or hemorrhage. 2. These results were called by telephone at the time of interpretation on 08/02/2019 at 4:17 pm to provider Dr. Melina Copa, who verbally acknowledged these results. 3. ASPECTS is Electronically Signed   By: Nelson Chimes M.D.   On: 08/02/2019 16:19       HISTORY OF PRESENT ILLNESS Roshawnda Pecora is an 86 y.o. female with a recent cerebellar stroke on June 6, discharged from [facility] on June 11, started on Plavix and Lipitor, who presented as code stroke to the [facility] ED this afternoon. She had reportedly been acting normal all day with her daughter and then became confused, using inappropriate words and not acting normally. After arrival to the [facility] ED, she was seen by Teleneurology for confusion and possible aphasia. Exam was limited due to mental status according to the note. Her NIHSS was 5. CT head showed no acute hypodensity or hemorrhage. She was not a candidate for tPA due to her recent stroke. Teleneurology recommended CTA head and neck which revealed an acute right M2 occlusion. Dr. Rory Percy spoke with the patient's daughter, Ms. Mohammed, over the phone and discussed the option of thrombectomy. It was noted that the patient's modified Rankin score is 2. Daughter agreed to thrombectomy after risks/benefits were discussed. She was emergently transported to the [facility] ED to be signed in and then emergently transported to IR. Per her daughter, the patient  has no metal implants, pacemaker or other implantable electronic devices. She was LKW 1500 on 08/02/2019.   HOSPITAL COURSE Ms.  Berenis Corter is a 86 y.o. female with history of atrial fibrillation (Plavix PTA), CAD, CKD, HLD, HTN, stomach cancer, aspirin allergy, recent cerebellar stroke on June 6 [facility]), presenting with confusion and speech difficulties. She did not receive IV t-PA due to recent stroke. Taken to IR for Mechanical thrombectomy mid right M2/MCA posterior division branch occlusion. Total one pass, TICI3 revascularization.   Stroke: right MCA small/punctate scattered infarcts with punctate left MCA/ACA and right cerebellum infarcts with right M2 occlusion s/p TICI3 reperfusion - embolic pattern due to paroxysmal AF or Aflutter CT Head - subacute infarction in the left cerebellar hemisphere. No mass effect or hemorrhage.  CTA H&N - Occlusion of a right proximal M2 branch.   IR - thrombectomy R M2 posterior branch occlusion with TICI3 revascularization Post IR CT no hemorrhage  MRI head - right MCA small/punctate scattered infarcts with punctate left MCA/ACA and right cerebellum infarcts MRA head - right MCA M2 patent 2D Echo EF 60 to 65% [employer] Virus 2 - negative LDL 55 HgbA1c 5.6 VTE prophylaxis - Elquis clopidogrel 75 mg daily prior to admission, now on Eliquis  Therapy recommendations: CIR Disposition:  CIR   Hx of stroke 6/6-6/11 dizziness - admission to [facility] for left cerebellar infarct.  CTA head and neck negative.  Put on Plavix and statin on discharge.  Recommend heart monitor for 30 days as outpatient. Heart monitor on hold during current admission This time MRI showed subacute left cerebellum infarct with petechial hemorrhage   Paroxysmal a flutter versus A. Fib Paroxysmal a flutter in 2013 status post cardioversion Paroxysmal a flutter in 2014 status post cardioversion As per [facility] note in care everywhere, patient was on Xarelto but stopped due to GI bleeding Per patient and daughter, patient was not on Xarelto before and had no GI bleeding. Most likely the cause of recent recurrent  embolic strokes Eliquis started 6/19  Aspirin allergy - anaphylaxis    Hypertension Home BP meds: Lopressor ; Calan ; Apresoline ; Zestril Treated with cleviprex, now off Current BP meds: metoprolol ; verapamil Stable Long-term BP goal normotensive   Hyperlipidemia Home Lipid lowering medication: Lipitor 40 mg daily LDL 55, goal < 70 Current lipid lowering medication: Lipitor 40 mg daily Continue statin at discharge   Other Stroke Risk Factors Advanced age Coronary artery disease   Other Active Problems Aspirin allergy - anaphylaxis  CKD - stage 3b - creatinine - 1.19 Leukocytosis - resolved    DISCHARGE EXAM Blood pressure (!) 157/68, pulse (!) 59, temperature 98.7 F (37.1 C), temperature source Oral, resp. rate 18, weight 73.1 kg, SpO2 99 %. General - Well nourished, well developed, in no apparent distress.   Ophthalmologic - fundi not visualized due to noncooperation.   Cardiovascular - Regular rhythm and rate, not in afib or aflutter.   Mental Status -  Level of arousal and orientation to time, place, and person were intact. Language including expression, repetition, comprehension was assessed and found intact.    Cranial Nerves II - XII - II - Visual field intact OU. III, IV, VI - Extraocular movements intact. V - Facial sensation intact bilaterally. VII - left mild facial nasolabial fold flatterning. VIII - Hearing & vestibular intact bilaterally. X - Palate elevates symmetrically. XI - Chin turning & shoulder shrug intact bilaterally XII - Tongue protrusion intact.   Motor Strength - The  patient's strength was normal in RUE and RLE, left side seems normal today with 5/5 in the upper or lower extremities with bulk was normal and fasciculations were absent.   Motor Tone - Muscle tone was assessed at the neck and appendages and was normal.   Reflexes - The patient's reflexes were symmetrical in all extremities and she had no pathological reflexes.   Sensory  - Light touch, temperature/pinprick were assessed and were symmetrical subjectively.     Coordination - The patient had normal movements in the right hand with no ataxia or dysmetria.  No dysmetria noted today in the left upper extremity.  Tremor was absent.   Gait and Station -using a walker and somewhat slow.  Discharge Diet  Heart healthy thin liquids  DISCHARGE PLAN Disposition:  Transfer to [place] for ongoing PT, OT and ST Eliquis (apixaban) daily for secondary stroke prevention  Recommend ongoing stroke risk factor control by Primary Care Physician at time of discharge from inpatient rehabilitation. Follow-up PCP Sabapathi, Anuradha, MD in 2 weeks following discharge from rehab. Follow-up in [facility] in 4 weeks following discharge from rehab, office to schedule an appointment.   35 minutes were spent preparing discharge.  Burnetta Sabin, MSN, APRN, ANVP-BC, AGPCNP-BC Advanced Practice Stroke Nurse [place] for Schedule & Pager information 08/07/2019 2:25 PM  I have personally obtained history,examined this patient, reviewed notes, independently viewed imaging studies, participated in medical decision making and plan of care.ROS completed by me personally and pertinent positives fully documented  I have made any additions or clarifications directly to the above note. Agree with note above.    Antony Contras, MD Medical Director [facility] Pager: [phone] 08/07/2019 4:36 PM

## 2019-08-07 NOTE — IPOC Note (Signed)
Individualized overall Plan of Care [facility]) Patient Details Name: Jodi [name] MRN: [id number] DOB: 03-22-1932  Admitting Diagnosis: Right middle cerebral artery stroke [facility])  Hospital Problems: Principal Problem:   Right middle cerebral artery stroke ([place]) Active Problems:   Acute blood loss anemia   Constipation   Hypoalbuminemia due to protein-calorie malnutrition ([place])     Functional Problem List: Nursing Safety, Sensory, Endurance  PT Balance, Safety, Endurance, Motor  OT Balance  SLP    TR         Basic ADL's: OT Toileting, Dressing, Bathing, Grooming     Advanced  ADL's: OT Simple Meal Preparation     Transfers: PT Bed Mobility, Bed to Chair, Car, Furniture, Futures trader, Metallurgist: PT Ambulation, Emergency planning/management officer, Stairs     Additional Impairments: OT None  SLP        TR      Anticipated Outcomes Item Anticipated Outcome  Self Feeding no goal, pt is I  Swallowing      Basic self-care  Mod I  Toileting  Mod I   Bathroom Transfers Mod I  Bowel/Bladder  pt will remain continent for the remainder of stay  Transfers  mod I  Locomotion  mod I household distances using LRAD, supervision community mobility  Communication     Cognition     Pain  pt will remain with a pain score of less than 3  Safety/Judgment  pt will call when in need of assistance   Therapy Plan: PT Intensity: Minimum of 1-2 x/day ,45 to 90 minutes PT Frequency: 5 out of 7 days PT Duration Estimated Length of Stay: 2-3 days OT Intensity: Minimum of 1-2 x/day, 45 to 90 minutes OT Frequency: 5 out of 7 days OT Duration/Estimated Length of Stay: 2-3 days      Team Interventions: Nursing Interventions Patient/Family Education, Discharge Planning  PT interventions Ambulation/gait training, Community reintegration, DME/adaptive equipment instruction, Neuromuscular re-education, Psychosocial support, Stair training, UE/LE Strength taining/ROM,  Training and development officer, Discharge planning, Functional electrical stimulation, Pain management, Skin care/wound management, Therapeutic Activities, UE/LE Coordination activities, Cognitive remediation/compensation, Disease management/prevention, Functional mobility training, Patient/family education, Splinting/orthotics, Therapeutic Exercise, Visual/perceptual remediation/compensation  OT Interventions Balance/vestibular training, Discharge planning, Functional mobility training, DME/adaptive equipment instruction, Neuromuscular re-education, Patient/family education, Psychosocial support, Therapeutic Activities, UE/LE Coordination activities, UE/LE Strength taining/ROM, Therapeutic Exercise  SLP Interventions    TR Interventions    SW/CM Interventions Discharge Planning, Psychosocial Support, Patient/Family Education   Barriers to Discharge MD  Medical stability  Nursing Inaccessible home environment, Decreased caregiver support, Medication compliance    PT Inaccessible home environment, Lack of/limited family support 3 STE 1 rail to home and new apartment, patient's daught able to provide intermittent supervision at d/c  OT      SLP      SW       Team Discharge Planning: Destination: PT-Home ,OT- Home , SLP-  Projected Follow-up: PT-Outpatient PT, OT-  None, SLP-  Projected Equipment Needs: PT-None recommended by PT, OT- Tub/shower seat, SLP-  Equipment Details: PT-Patient has a RW and SPC, OT-daughter will purchase seat on her own Patient/family involved in discharge planning: PT- Patient,  OT-Patient, Family member/caregiver, SLP-   MD ELOS: 2-3 days. Medical Rehab Prognosis:  Good Assessment: 86 year old left-handed female with history of hypertension, CKD stage III with admission creatinine 1.20, CAD, PAF, right cerebellar CVA 07/22/2019 DC'd from [facility] on Plavix.  Presented 08/02/2019 with altered mental status.  She was found to have  occlusion of right proximal M2 branch and  underwent cerebral angiogram with mechanical thrombectomy with complete revascularization of right M2 MCA posterior division.  Follow-up MRI/MRA of the brain revealed widely scattered punctate infarcts in the right hemisphere from posterior corona radiata to lentiform, occasional infarct in left hemisphere and cerebellum and chronic left cerebellar PICA infarction.  Echocardiogram with ejection fraction of 60 to 65% mild concentric LVH.  Neurology follow-up maintained on Eliquis for CVA prophylaxis.  Tolerating a regular consistency diet. Patient with resulting functional deficits with mobility, self-care.  Will set goals for Mod I with PT/OT.     Due to the current state of emergency, patients may not be receiving their 3-hours of Medicare-mandated therapy.  See Team Conference Notes for weekly updates to the plan of care

## 2019-08-07 NOTE — Progress Notes (Signed)
Jodi [name], OT  Rehab Admission Coordinator  Physical Medicine and Rehabilitation  PMR Pre-admission      Signed  Date of Service:  08/06/2019  4:39 PM      Related encounter: ED to Hosp-Admission (Discharged) from 08/02/2019 in [facility]      Signed       Show:Clear all '[x]'$ Manual'[x]'$ Template'[x]'$ Copied  Added by: '[x]'$ Jodi [name], OT  '[]'$ Hover for details PMR Admission Coordinator Pre-Admission Assessment   Patient: Jodi [name] is an 86 y.o., female MRN: [id number] DOB: 1932-09-16 Height:   Weight: 73.1 kg                                                                                                                                                  Insurance Information HMO:     PPO:      PCP:      IPA:      80/20: yes     OTHER:  PRIMARY: Medicare Railroad      Policy#: [id number]     Subscriber: patient CM Name:       Phone#:      Fax#:  Pre-Cert#:       Employer:  Benefits:  Phone #: online     Name: verified eligibility online via [place] on 08/06/19 Eff. Date: part A and B effective 05/17/1999     Deduct: $1,484      Out of Pocket Max: NA      Life Max: NA  CIR: Covered per Medicare Railroad guidelines once yearly deductible has been met.    SNF: days 1-20, 100%, days 21-100, 80% Outpatient: 80%     Co-Pay: 20% Home Health: 100%      Co-Pay:  DME: 80%     Co-Pay: 20% Providers:  SECONDARY: Generic Commercial      Policy#: [id number]      Phone#: [phone]   TERTIARY: BCBS      Policy: [id number] Phone #: [phone]   Financial Counselor:       Phone#:    The "Data Collection Information Summary" for patients in Inpatient Rehabilitation Facilities with attached "Privacy Act [place] Records" was provided and verbally reviewed with: Patient and Family   Emergency Contact Information Contact Information     Name Relation Home Work [place] Daughter [phone]   [phone]       Current Medical  History  Patient Admitting Diagnosis: Bilateral small cardioembolic infarcts in the cerebral hemispheres secondary to atrial fibrillation   History of Present Illness: Jodi [name] is a 86 y.o. 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 She was admitted on 08/02/19 with confusion and disorientation. She was found to have occlusion of right proximal M2 branch and underwent cerebral angio with mechanical thrombectomy with complete  recanalization of right M2 MCA posterior division.  Follow up MRI/MRA brain done revealing widely scattered punctate infarcts in right hemisphere from posterior corona radiata to lentiform, occasional infarct in left hemisphere and cerebellum and chronic Left cerebellar PICA infract. 2D echo with EF 60-65% mild concentric LVH.  Patient with history of multiple strokes and not on AC due to question of GIB--patient/family denied hx. Dr. Erlinda Hong felt that stroke as embolic due to A fib and Eliquis added for stroke prevention. Patient with dysarthria with balance deficits and LUE proprioceptive deficits. CIR recommended due to functional deficits. Pt is to admit to CIR on 08/07/19.   Complete NIHSS TOTAL: 0 Glasgow Coma Scale Score: 15   Past Medical History      Past Medical History:  Diagnosis Date  . Atrial fibrillation ([place])    . CAD (coronary artery disease)    . CKD (chronic kidney disease) stage 3, GFR 30-59 ml/min    . CVA (cerebral vascular accident) ([place])    . HLD (hyperlipidemia)    . HTN (hypertension)    . 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([place])        Family History  family history includes Prostate cancer in her father.   Prior Rehab/Hospitalizations:  Has the patient had prior rehab or hospitalizations prior to admission? No   Has the patient had major surgery during 100 days prior to admission? Yes   Current Medications    Current Facility-Administered Medications:  .  acetaminophen (TYLENOL) tablet 650 mg, 650 mg,  Oral, Q4H PRN, 650 mg at 08/04/19 1514 **OR** acetaminophen (TYLENOL) 160 MG/5ML solution 650 mg, 650 mg, Per Tube, Q4H PRN **OR** acetaminophen (TYLENOL) suppository 650 mg, 650 mg, Rectal, Q4H PRN, Kerney Elbe, MD .  apixaban (ELIQUIS) tablet 5 mg, 5 mg, Oral, BID, Ronna Polio, RPH, 5 mg at 08/07/19 0919 .  atorvastatin (LIPITOR) tablet 40 mg, 40 mg, Oral, QHS, Kerney Elbe, MD, 40 mg at 08/06/19 2207 .  Chlorhexidine Gluconate Cloth 2 % PADS 6 each, 6 each, Topical, Daily, Kerney Elbe, MD, 6 each at 08/06/19 1012 .  furosemide (LASIX) tablet 20 mg, 20 mg, Oral, Daily, Rosalin Hawking, MD, 20 mg at 08/07/19 0932 .  metoprolol tartrate (LOPRESSOR) tablet 25 mg, 25 mg, Oral, BID, Rosalin Hawking, MD, 25 mg at 08/07/19 0931 .  ondansetron (ZOFRAN) injection 4 mg, 4 mg, Intravenous, Q6H PRN, de Sindy Messing, [place], MD .  phenol (CHLORASEPTIC) mouth spray 1 spray, 1 spray, Mouth/Throat, PRN, Kerney Elbe, MD .  polyethylene glycol (MIRALAX / GLYCOLAX) packet 17 g, 17 g, Oral, Daily, [place], Kofi, MD, 17 g at 08/07/19 0916 .  senna-docusate (Senokot-S) tablet 1 tablet, 1 tablet, Oral, QHS PRN, Kerney Elbe, MD, 1 tablet at 08/04/19 2228 .  sodium chloride flush (NS) 0.9 % injection 3 mL, 3 mL, Intravenous, Once, Daleen Bo, MD .  verapamil (CALAN) tablet 160 mg, 160 mg, Oral, BID, Kerney Elbe, MD, 160 mg at 08/07/19 0932 .  white petrolatum (VASELINE) gel, , Topical, PRN, Rosalin Hawking, MD   Patients Current Diet:     Diet Order                      Diet Heart Room service appropriate? 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  Independent   Indoor Mobility: Did the patient need assistance with walking from room to room (with or without device)? Independent   Stairs: Did the patient need assistance with internal or external stairs (with or without device)? Independent   Functional Cognition: Did the patient need help planning regular tasks such as shopping or remembering to take medications? Needed some help   Home Assistive Devices / [place] Devices/Equipment: None Home Equipment: Walker - 2 wheels, Wheelchair - manual   Prior Device Use: Indicate devices/aids used by the patient prior to current illness, exacerbation or injury? 4 point cane   Current Functional Level Cognition   Arousal/Alertness: Awake/alert Overall Cognitive Status: Within Functional Limits for tasks assessed Orientation Level: Oriented X4 General Comments: difficulty processing multi step commands with testing Attention: Sustained Sustained Attention: Appears intact Memory: Impaired Memory Impairment: Retrieval deficit, Decreased recall of new information Awareness: Impaired Awareness Impairment: Emergent impairment Problem Solving: Impaired Problem Solving Impairment: Functional basic Safety/Judgment: Impaired    Extremity Assessment (includes Sensation/Coordination)   Upper Extremity Assessment: LUE deficits/detail LUE Deficits / Details: diffuse weakness with poor proprioception and motor control LUE Sensation: decreased proprioception LUE Coordination: decreased fine motor, decreased gross motor  Lower Extremity Assessment: Generalized weakness     ADLs   Overall ADL's : Needs assistance/impaired Eating/Feeding: Set up,  Sitting Grooming: Set up, Sitting Upper Body Bathing: Minimal assistance, Sitting Lower Body Bathing: Moderate assistance, Sit to/from stand, Sitting/lateral leans Upper Body Dressing : Minimal assistance, Sitting Lower Body Dressing: Moderate assistance, Sit to/from stand, Sitting/lateral leans Toilet Transfer: Moderate assistance, Stand-pivot, BSC Toileting- Clothing Manipulation and Hygiene: Moderate assistance, Sitting/lateral lean, Sit to/from stand Functional mobility during ADLs: Moderate assistance, Cueing for safety, Cueing for sequencing, Rolling walker     Mobility   Overal bed mobility: Modified Independent Bed Mobility: Supine to Sit Supine to sit: Supervision General bed mobility comments: up in chair     Transfers   Overall transfer level: Needs assistance Equipment used: None Transfers: Sit to/from Stand Sit to Stand: Min guard Stand pivot transfers: Min assist General transfer comment: min guard for safety      Ambulation / Gait / Stairs / Wheelchair Mobility   Ambulation/Gait Ambulation/Gait assistance: Min guard, Min assist Gait Distance (Feet): 200 Feet Assistive device: 1 person hand held assist (assist at trunk with gait belt) Gait Pattern/deviations: Step-through pattern, Decreased stride length, Narrow base of support, Staggering right General Gait Details: initially with shuffling gait pattern and able to improved stride length and cadence with verbal cues; assistance required at trunk with gait belt and then with gait belt and HHA +1 on return to room due to impaired balance without use of AD; pt staggers to R and with near LOB with L horizontal head turns Gait velocity: reduced Gait velocity interpretation: <1.8 ft/sec, indicate of risk for recurrent falls     Posture / Balance Dynamic Sitting Balance Sitting balance - Comments: supervision at the edge of bed Balance Overall balance assessment: Needs assistance Sitting-balance  support: No upper  extremity supported, Feet supported Sitting balance-Leahy Scale: Good Sitting balance - Comments: supervision at the edge of bed Postural control: Posterior lean Standing balance support: Single extremity supported, During functional activity Standing balance-Leahy Scale: Poor Standing balance comment: close supervision reaching >10 inches outside of her BOS     Special needs/care consideration Skin: closed incision to right anterior wrist    Designated visitor: daughter Lenord Carbo        Previous Surveyor, minerals (from acute therapy documentation) Living Arrangements: Alone  Lives With: Alone Available Help at Discharge: Family Type of Home: House Home Layout: One level Home Access: Stairs to enter Entrance Stairs-Rails:  (unure) Secretary/administrator of Steps: 3 Bathroom Shower/Tub: Engineer, manufacturing systems: Standard Home Care Services: No Additional Comments: RN does report after session that the patient's daughter was going to sign an apartment lease today so the patient and daughter could live together   Discharge Living Setting Plans for Discharge Living Setting: Apartment (plan for pt and duaghter to move from 1 bd to 2 bd apartment) Type of Home at Discharge: Apartment Discharge Home Layout: One level Discharge Home Access: Stairs to enter Entrance Stairs-Rails: None Entrance Stairs-Number of Steps: 3 Discharge Bathroom Shower/Tub: Tub/shower unit Discharge Bathroom Toilet: Standard Discharge Bathroom Accessibility: Yes How Accessible: Accessible via walker Does the patient have any problems obtaining your medications?: No   Social/Family/Support Systems Patient Roles: Other (Comment) (retired Lawyer; 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 Information: daugther: Lenord Carbo [phone] Anticipated Caregiver: daughter Anticipated Caregiver's Contact Information: see above Ability/Limitations of Caregiver: Min A/G Caregiver Availability: 24/7 Discharge Plan Discussed with  Primary Caregiver: Yes Is Caregiver In Agreement with Plan?: Yes Does Caregiver/Family have Issues with Lodging/Transportation while Pt is in Rehab?: No     Goals Patient/Family Goal for Rehab: PT/OT: Mod I/Supervision Expected length of stay: 5-7 days Cultural Considerations: NA Pt/Family Agrees to Admission and willing to participate: Yes Program Orientation Provided & Reviewed with Pt/Caregiver Including Roles  & Responsibilities: Yes (pt and her daughter)  Barriers to Discharge: Home environment access/layout  Barriers to Discharge Comments: steps to enter     Decrease burden of Care through IP rehab admission: NA     Possible need for SNF placement upon discharge:Not anticipated. Pt has good family support from her daughter and has Mod I/Supervision goals after a relatively short CIR stay.      Patient Condition: This patient's medical and functional status has changed since the consult dated: 08/06/19 in which the Rehabilitation Physician determined and documented that the patient's condition is appropriate for intensive rehabilitative care in an inpatient rehabilitation facility. Medical changes are: None-see H&P for details.  Functional changes are: slight increase in support during ambulation when not using AD, from 200 feet Min G/Supervision with RW to Min G/Min A 200 feet HHA. After evaluating the patient today and speaking with the Rehabilitation physician and acute team, the patient remains appropriate for inpatient rehab. Will admit to inpatient rehab today.   Preadmission Screen Completed By:  Cheri Rous, OT, 08/07/2019 1:07 PM ______________________________________________________________________   Discussed status with Dr. Carlis Abbott on 08/07/19 at 1:07PM and received approval for admission today.   Admission Coordinator:  Cheri Rous, time 1:07PM/Date 08/07/19             Cosigned by: Horton Chin, MD at 08/07/2019  1:19 PM  Revision History                  Note Details  Author Cheri Rous, OT  File Time 08/07/2019  1:07 PM  Author Type Rehab Admission Coordinator Status Signed  Last Editor Jodi [name], OT Service Physical Medicine and [place] # [id number] Admit Date 08/07/2019

## 2019-08-07 NOTE — Progress Notes (Signed)
Charlett Blake, MD  Physician  Physical Medicine and Rehabilitation  Consult Note      Signed  Date of Service:  08/06/2019 11:34 AM      Related encounter: ED to Hosp-Admission (Discharged) from 08/02/2019 in [place] 3W [facility]      Signed      Expand All Collapse All  Show:Clear all [x] Manual[x] Template[] Copied  Added by: [x] Kirsteins, Luanna Salk, MD[x] Love, Ivan Anchors, PA-C  [] Hover for details          Physical Medicine and Rehabilitation Consult   Reason for Consult: Stroke with functional deficits.  Referring Physician: Dr. Erlinda Hong     HPI: Jodi [name] is a 86 y.o. 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 She was admitted on 08/02/19 with confusion and disorientation. She was found to have occlusion of right proximal M2 branch and underwent cerebral angio with mechanical thrombectomy with complete recanalization of right M2 MCA posterior division.  Follow up MRI/MRA brain done revealing widely scattered punctate infarcts in right hemisphere from posterior corona radiata to lentiform, occasional infarct in left hemisphere and cerebellum and chronic Left cerebellar PICA infract. 2D echo with EF 60-65% mild concentric LVH.  Patient with history of multiple strokes and not on AC due to question of GIB--patient/family denied hx. Dr. Erlinda Hong felt that stroke as embolic due to A fib and Eliquis added for stroke prevention. Patient with dysarthria with balance deficits and LUE proprioceptive deficits. CIR recommended due to functional deficits.    Daughter is at bedside, she indicates that she will be able to help post discharge and will be able to stay overnight however during the day will be running errands.  The patient will be moved to a 2 bedroom apartment after discharge.  Also the daughter would like to get a larger home and is trying to sell her own home and bring the patient back up with her once that is  accomplished.   Review of Systems  Constitutional: Negative for chills and fever.  HENT: Negative for hearing loss and tinnitus.   Eyes: Negative for blurred vision and double vision.  Respiratory: Negative for cough and shortness of breath.   Cardiovascular: Negative for chest pain and palpitations.  Gastrointestinal: Negative for constipation, heartburn and nausea.  Genitourinary: Negative for dysuria and urgency.  Musculoskeletal: Negative for back pain, myalgias and neck pain.  Skin: Negative for rash.  Neurological: Positive for dizziness and weakness. Negative for speech change.            Past Medical History:  Diagnosis Date  . Atrial fibrillation ([place])    . CAD (coronary artery disease)    . CKD (chronic kidney disease) stage 3, GFR 30-59 ml/min    . CVA (cerebral vascular accident) ([place])    . HLD (hyperlipidemia)    . HTN (hypertension)    . 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[facility])             Past Surgical History:  Procedure Laterality Date  . IR CT HEAD LTD   08/02/2019  . IR PERCUTANEOUS ART THROMBECTOMY/INFUSION INTRACRANIAL INC DIAG ANGIO   08/02/2019       . IR PERCUTANEOUS ART THROMBECTOMY/INFUSION INTRACRANIAL INC DIAG ANGIO   08/02/2019  . RADIOLOGY WITH ANESTHESIA N/A 08/02/2019    Procedure: IR WITH ANESTHESIA;  Surgeon: Luanne Bras, MD;  Location: [facility];  Service: Radiology;  Laterality: N/A;  Family History  Problem Relation Age of Onset  . Prostate cancer Father        Social History: Married. Retired Quarry manager. Was independent with cane PTA. She moved to [place] a few years ago and lives alone. Daughter married and works part time. She does not use tobacco, smokeless tobacco or alcohol.      Allergies  Allergen Reactions  . Aciphex [Rabeprazole]        Hives  . Aspirin        Anaphylaxis  . Hydralazine        Nausea  . Morphine And Related        Emesis            Medications Prior to Admission  Medication Sig Dispense Refill  . albuterol  (VENTOLIN HFA) 108 (90 Base) MCG/ACT inhaler Inhale 2 puffs into the lungs every 4 (four) hours as needed for wheezing or shortness of breath.       Marland Kitchen atorvastatin (LIPITOR) 40 MG tablet Take 40 mg by mouth at bedtime.      . Cholecalciferol (VITAMIN D) 50 MCG (2000 UT) CAPS Take 1 capsule by mouth daily.      . clopidogrel (PLAVIX) 75 MG tablet Take 75 mg by mouth daily.      . furosemide (LASIX) 20 MG tablet Take 20 mg by mouth daily.      . hydrALAZINE (APRESOLINE) 25 MG tablet Take 25 mg by mouth 2 (two) times daily.      Marland Kitchen lisinopril (ZESTRIL) 20 MG tablet Take 20 mg by mouth daily.      . meclizine (ANTIVERT) 12.5 MG tablet Take 12.5 mg by mouth 3 (three) times daily as needed for dizziness.       . metoprolol tartrate (LOPRESSOR) 50 MG tablet Take 50 mg by mouth 2 (two) times daily.      Marland Kitchen senna (SENOKOT) 8.6 MG tablet Take 1 tablet by mouth daily as needed for constipation.      . triamcinolone (KENALOG) 0.1 % paste Use as directed 1 application in the mouth or throat 2 (two) times daily.      . verapamil (CALAN) 80 MG tablet Take 160 mg by mouth 2 (two) times daily.           Home: Home Living Family/patient expects to be discharged to:: Private residence Living Arrangements: Alone Available Help at Discharge: Family Type of Home: House Home Access: Stairs to enter Technical brewer of Steps: 3 Entrance Stairs-Rails:  (unure) Home Layout: One level Bathroom Shower/Tub: Chiropodist: [place]: Environmental consultant - 2 wheels, Wheelchair - manual Additional Comments: RN does report after session that the patient's daughter was going to sign an apartment lease today so the patient and daughter could live together  Lives With: Alone  Functional History: Prior Function Level of Independence: Independent Comments: pt reports that she had progressed to ambulating without an assistive device since most recent stroke Functional Status:  Mobility: Bed  Mobility Overal bed mobility: Needs Assistance Bed Mobility: Supine to Sit Supine to sit: Supervision General bed mobility comments: up in chair Transfers Overall transfer level: Needs assistance Equipment used: Rolling walker (2 wheeled) Transfers: Sit to/from Stand Sit to Stand: Supervision Stand pivot transfers: Min assist General transfer comment: pt initially without device, some improvement with use of RW to power up to standing Ambulation/Gait Ambulation/Gait assistance: Min guard, Supervision (minG progressing to close supervision) Gait Distance (Feet): 200 Feet Assistive device: Rolling walker (2 wheeled)  Gait Pattern/deviations: Step-through pattern, Decreased stride length General Gait Details: pt with shortened step through gait, pt is able to perform head turns, change gait speed, stop abruptly without noticeablle LOB. Pt does ambulate with reduced gait speed when not requested to ambulate at a certain speed Gait velocity: reduced Gait velocity interpretation: <1.8 ft/sec, indicate of risk for recurrent falls   ADL: ADL Overall ADL's : Needs assistance/impaired Eating/Feeding: Set up, Sitting Grooming: Set up, Sitting Upper Body Bathing: Minimal assistance, Sitting Lower Body Bathing: Moderate assistance, Sit to/from stand, Sitting/lateral leans Upper Body Dressing : Minimal assistance, Sitting Lower Body Dressing: Moderate assistance, Sit to/from stand, Sitting/lateral leans Toilet Transfer: Moderate assistance, Stand-pivot, BSC Toileting- Clothing Manipulation and Hygiene: Moderate assistance, Sitting/lateral lean, Sit to/from stand Functional mobility during ADLs: Moderate assistance, Cueing for safety, Cueing for sequencing, Rolling walker   Cognition: Cognition Overall Cognitive Status: Within Functional Limits for tasks assessed Arousal/Alertness: Awake/alert Orientation Level: Oriented X4 Attention: Sustained Sustained Attention: Appears intact Memory:  Impaired Memory Impairment: Retrieval deficit, Decreased recall of new information Awareness: Impaired Awareness Impairment: Emergent impairment Problem Solving: Impaired Problem Solving Impairment: Functional basic Safety/Judgment: Impaired Cognition Arousal/Alertness: Awake/alert Behavior During Therapy: WFL for tasks assessed/performed Overall Cognitive Status: Within Functional Limits for tasks assessed Area of Impairment: Problem solving Problem Solving: Slow processing, Difficulty sequencing, Requires verbal cues General Comments: difficulty processing multi step commands with testing   Blood pressure (!) 148/64, pulse 66, temperature 98.2 F (36.8 C), temperature source Oral, resp. rate 16, weight 73.1 kg, SpO2 98 %. Physical Exam  Nursing note and vitals reviewed. Constitutional: She is oriented to person, place, and time.  HENT:  Head: Normocephalic and atraumatic.  Mouth/Throat: Mucous membranes are moist.  Eyes: Pupils are equal, round, and reactive to light. Conjunctivae are normal.  exophthalmus   Cardiovascular: Normal rate. An irregular rhythm present.  No murmur heard. Respiratory: Effort normal and breath sounds normal. No stridor. No respiratory distress. She has no wheezes. She has no rhonchi.  GI: Soft. Normal appearance and bowel sounds are normal. She exhibits no distension and no mass.  Musculoskeletal:        General: No deformity. Normal range of motion.     Cervical back: No rigidity.     Right lower leg: No edema.     Left lower leg: No edema.  Neurological: She is alert and oriented to person, place, and time. She displays no weakness. No sensory deficit. She has a normal Finger-Nose-Finger Test.  Speech clear. Able to follow simple one and two step commands without difficulty. Decrease in fine motor control RUE.  Motor strength is 4/5 bilateral deltoid, bicep, tricep, grip, hip flexor, knee extensor, ankle dorsiflexor  Skin: Skin is warm and dry.    Psychiatric: Her behavior is normal. Mood normal.      Lab Results Last 24 Hours       Results for orders placed or performed during the hospital encounter of 08/02/19 (from the past 24 hour(s))  CBC     Status: Abnormal    Collection Time: 08/06/19  4:49 AM  Result Value Ref Range    WBC 5.6 4.0 - 10.5 K/uL    RBC 4.03 3.87 - 5.11 MIL/uL    Hemoglobin 11.6 (L) 12.0 - 15.0 g/dL    HCT 36.4 36 - 46 %    MCV 90.3 80.0 - 100.0 fL    MCH 28.8 26.0 - 34.0 pg    MCHC 31.9 30.0 - 36.0 g/dL  RDW 14.2 11.5 - 15.5 %    Platelets 232 150 - 400 K/uL    nRBC 0.0 0.0 - 0.2 %  Basic metabolic panel     Status: Abnormal    Collection Time: 08/06/19  4:49 AM  Result Value Ref Range    Sodium 142 135 - 145 mmol/L    Potassium 4.0 3.5 - 5.1 mmol/L    Chloride 108 98 - 111 mmol/L    CO2 27 22 - 32 mmol/L    Glucose, Bld 88 70 - 99 mg/dL    BUN 8 8 - 23 mg/dL    Creatinine, Ser 1.16 (H) 0.44 - 1.00 mg/dL    Calcium 9.1 8.9 - 10.3 mg/dL    GFR calc non Af Amer 43 (L) >60 mL/min    GFR calc Af Amer 50 (L) >60 mL/min    Anion gap 7 5 - 15      Imaging Results (Last 48 hours)  No results found.       Assessment/Plan: Diagnosis: Bilateral small cardioembolic infarcts in the cerebral hemispheres secondary to atrial fibrillation 1. Does the need for close, 24 hr/day medical supervision in concert with the patient's rehab needs make it unreasonable for this patient to be served in a less intensive setting? Yes 2. Co-Morbidities requiring supervision/potential complications: Hypertension, coronary artery disease, anticoagulation 3. Due to bladder management, bowel management, safety, skin/wound care, disease management, medication administration, pain management and patient education, does the patient require 24 hr/day rehab nursing? Yes 4. Does the patient require coordinated care of a physician, rehab nurse, therapy disciplines of PT, OT to address physical and functional deficits in the context  of the above medical diagnosis(es)? Yes Addressing deficits in the following areas: balance, endurance, locomotion, strength, transferring, bowel/bladder control, bathing, dressing, feeding, grooming, toileting, cognition and psychosocial support 5. Can the patient actively participate in an intensive therapy program of at least 3 hrs of therapy per day at least 5 days per week? Yes 6. The potential for patient to make measurable gains while on inpatient rehab is good 7. Anticipated functional outcomes upon discharge from inpatient rehab are modified independent and supervision  with PT, modified independent and supervision with OT, n/a with SLP. 8. Estimated rehab length of stay to reach the above functional goals is: 5 to 7 days 9. Anticipated discharge destination: Home 10. Overall Rehab/Functional Prognosis: good   RECOMMENDATIONS: This patient's condition is appropriate for continued rehabilitative care in the following setting: CIR Patient has agreed to participate in recommended program. Potentially Note that insurance prior authorization may be required for reimbursement for recommended care.   Comment:      Bary Leriche, PA-C 08/06/2019  "I have personally performed a face to face diagnostic evaluation of this patient.  Additionally, I have reviewed and concur with the physician assistant's documentation above." Charlett Blake M.D. [facility] FAAPM&R (Neuromuscular Med) Diplomate Am Board of Electrodiagnostic Med Fellow Am Board of Interventional Pain            Revision History                     Routing History           Note Details  Author Charlett Blake, MD File Time 08/06/2019  1:13 PM  Author Type Physician Status Signed  Last Editor Charlett Blake, MD Service Physical Medicine and [place] # [id number] Admit Date 08/07/2019

## 2019-08-07 NOTE — Progress Notes (Signed)
Inpatient Rehabilitation Medication Review by a Pharmacist  A complete drug regimen review was completed for this patient to identify any potential clinically significant medication issues.  Clinically significant medication issues were identified:  No  Time spent performing this drug regimen review (minutes):  15  Gillermina Hu, PharmD, BCPS, [facility] Clinical Pharmacist 08/07/2019 4:52 PM

## 2019-08-07 NOTE — H&P (Signed)
Physical Medicine and Rehabilitation Admission H&P    Chief Complaint  Patient presents with  . 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  Per chart review patient lives alone independent prior to admission.  1 level home 3 steps to entry.  Daughter plans to assist on discharge.  Presented 08/02/2019 with altered mental status.  She was found to have occlusion of right proximal M2 branch and underwent cerebral angiogram with mechanical thrombectomy with complete revascularization of right M2 MCA posterior division.  Follow-up MRI/MRA of the brain revealed widely scattered punctate infarcts in the right hemisphere from posterior corona radiata to lentiform, occasional infarct in left hemisphere and cerebellum and chronic left cerebellar PICA infarction.  Echocardiogram with ejection fraction of 60 to 65% mild concentric LVH.  Neurology follow-up maintained on Eliquis for CVA prophylaxis.  Tolerating a regular consistency diet.  Therapy evaluations completed and patient was admitted for a comprehensive rehab program.  Review of Systems  Constitutional: Negative for chills and fever.  HENT: Negative for hearing loss.   Eyes: Negative for blurred vision and double vision.  Respiratory: Negative for cough and shortness of breath.   Cardiovascular: Positive for palpitations and leg swelling.  Gastrointestinal: Positive for constipation. Negative for heartburn, nausea and vomiting.  Genitourinary: Negative for dysuria and flank pain.  Musculoskeletal: Positive for joint pain.  Skin: Negative for rash.  Neurological: Positive for weakness.  Psychiatric/Behavioral:       Mild memory loss  All other systems reviewed and are negative.  Past Medical History:  Diagnosis Date  . Atrial fibrillation ([place])   . CAD (coronary artery  disease)   . CKD (chronic kidney disease) stage 3, GFR 30-59 ml/min   . CVA (cerebral vascular accident) ([place])   . HLD (hyperlipidemia)   . HTN (hypertension)   . 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[facility])    Past Surgical History:  Procedure Laterality Date  . IR CT HEAD LTD  08/02/2019  . IR PERCUTANEOUS ART THROMBECTOMY/INFUSION INTRACRANIAL INC DIAG ANGIO  08/02/2019      . IR PERCUTANEOUS ART THROMBECTOMY/INFUSION INTRACRANIAL INC DIAG ANGIO  08/02/2019  . RADIOLOGY WITH ANESTHESIA N/A 08/02/2019   Procedure: IR WITH ANESTHESIA;  Surgeon: Luanne Bras, MD;  Location: [place];  Service: Radiology;  Laterality: N/A;   Family History  Problem Relation Age of Onset  . Prostate cancer Father    Social History:  has no history on file for tobacco use, alcohol use, and drug use. Allergies:  Allergies  Allergen Reactions  . Aciphex [Rabeprazole]     Hives  . Aspirin     Anaphylaxis  . Hydralazine     Nausea  . Morphine And Related     Emesis   Medications Prior to Admission  Medication Sig Dispense Refill  . albuterol (VENTOLIN HFA) 108 (90 Base) MCG/ACT inhaler Inhale 2 puffs into the lungs every 4 (four) hours as needed for wheezing or shortness of breath.     Marland Kitchen atorvastatin (LIPITOR) 40 MG tablet Take 40 mg by mouth at bedtime.    . Cholecalciferol (VITAMIN D) 50 MCG (2000 UT) CAPS Take 1 capsule by mouth daily.    . clopidogrel (PLAVIX) 75 MG tablet Take 75 mg by mouth daily.    . furosemide (LASIX) 20 MG tablet Take 20 mg by mouth daily.    Marland Kitchen  hydrALAZINE (APRESOLINE) 25 MG tablet Take 25 mg by mouth 2 (two) times daily.    Marland Kitchen lisinopril (ZESTRIL) 20 MG tablet Take 20 mg by mouth daily.    . meclizine (ANTIVERT) 12.5 MG tablet Take 12.5 mg by mouth 3 (three) times daily as needed for dizziness.     . metoprolol tartrate (LOPRESSOR) 50 MG tablet Take 50 mg by mouth 2 (two) times daily.    Marland Kitchen senna (SENOKOT) 8.6 MG tablet Take 1 tablet by mouth daily as needed for constipation.    . triamcinolone  (KENALOG) 0.1 % paste Use as directed 1 application in the mouth or throat 2 (two) times daily.    . verapamil (CALAN) 80 MG tablet Take 160 mg by mouth 2 (two) times daily.       Drug Regimen Review Drug regimen was reviewed and remains appropriate with no significant issues identified  Home: Home Living Family/patient expects to be discharged to:: Private residence Living Arrangements: Alone Available Help at Discharge: Family Type of Home: House Home Access: Stairs to enter Technical brewer of Steps: 3 Entrance Stairs-Rails:  (unure) Home Layout: One level Bathroom Shower/Tub: Chiropodist: Standard Home Equipment: Environmental consultant - 2 wheels, Wheelchair - manual Additional Comments: RN does report after session that the patient's daughter was going to sign an apartment lease today so the patient and daughter could live together  Lives With: Alone   Functional History: Prior Function Level of Independence: Independent Comments: pt reports that she had progressed to ambulating without an assistive device since most recent stroke  Functional Status:  Mobility: Bed Mobility Overal bed mobility: Modified Independent Bed Mobility: Supine to Sit Supine to sit: Supervision General bed mobility comments: up in chair Transfers Overall transfer level: Needs assistance Equipment used: None Transfers: Sit to/from Stand Sit to Stand: Min guard Stand pivot transfers: Min assist General transfer comment: min guard for safety  Ambulation/Gait Ambulation/Gait assistance: Min guard, Min assist Gait Distance (Feet): 200 Feet Assistive device: 1 person hand held assist (assist at trunk with gait belt) Gait Pattern/deviations: Step-through pattern, Decreased stride length, Narrow base of support, Staggering right General Gait Details: initially with shuffling gait pattern and able to improved stride length and cadence with verbal cues; assistance required at trunk with  gait belt and then with gait belt and HHA +1 on return to room due to impaired balance without use of AD; pt staggers to R and with near LOB with L horizontal head turns Gait velocity: reduced Gait velocity interpretation: <1.8 ft/sec, indicate of risk for recurrent falls    ADL: ADL Overall ADL's : Needs assistance/impaired Eating/Feeding: Set up, Sitting Grooming: Set up, Sitting Upper Body Bathing: Minimal assistance, Sitting Lower Body Bathing: Moderate assistance, Sit to/from stand, Sitting/lateral leans Upper Body Dressing : Minimal assistance, Sitting Lower Body Dressing: Moderate assistance, Sit to/from stand, Sitting/lateral leans Toilet Transfer: Moderate assistance, Stand-pivot, BSC Toileting- Clothing Manipulation and Hygiene: Moderate assistance, Sitting/lateral lean, Sit to/from stand Functional mobility during ADLs: Moderate assistance, Cueing for safety, Cueing for sequencing, Rolling walker  Cognition: Cognition Overall Cognitive Status: Within Functional Limits for tasks assessed Arousal/Alertness: Awake/alert Orientation Level: Oriented X4 Attention: Sustained Sustained Attention: Appears intact Memory: Impaired Memory Impairment: Retrieval deficit, Decreased recall of new information Awareness: Impaired Awareness Impairment: Emergent impairment Problem Solving: Impaired Problem Solving Impairment: Functional basic Safety/Judgment: Impaired Cognition Arousal/Alertness: Awake/alert Behavior During Therapy: WFL for tasks assessed/performed Overall Cognitive Status: Within Functional Limits for tasks assessed Area of Impairment: Problem solving Problem Solving:  Slow processing, Difficulty sequencing, Requires verbal cues General Comments: difficulty processing multi step commands with testing  Physical Exam: Blood pressure (!) 144/63, pulse 67, temperature 98 F (36.7 C), temperature source Oral, resp. rate 18, weight 73.1 kg, SpO2 99 %.  General: Alert and  oriented x 3, No apparent distress HEENT: Head is normocephalic, atraumatic, PERRLA, EOMI, sclera anicteric, oral mucosa pink and moist, dentition intact, ext ear canals clear,  Neck: Supple without JVD or lymphadenopathy Heart: Reg rate and rhythm. No murmurs rubs or gallops Chest: CTA bilaterally without wheezes, rales, or rhonchi; no distress Abdomen: Soft, non-tender, non-distended, bowel sounds positive. Extremities: No clubbing, cyanosis, or edema. Pulses are 2+ Skin: Clean and intact without signs of breakdown Neuro: Patient is alert in no acute distress.  Makes good eye contact with examiner follows commands.  Fair awareness of deficits. 5/5 strength throughout. Sensation intact throughout.  Psych: Pt's affect is appropriate. Pt is cooperative  Results for orders placed or performed during the hospital encounter of 08/02/19 (from the past 48 hour(s))  CBC     Status: Abnormal   Collection Time: 08/06/19  4:49 AM  Result Value Ref Range   WBC 5.6 4.0 - 10.5 K/uL   RBC 4.03 3.87 - 5.11 MIL/uL   Hemoglobin 11.6 (L) 12.0 - 15.0 g/dL   HCT 36.4 36 - 46 %   MCV 90.3 80.0 - 100.0 fL   MCH 28.8 26.0 - 34.0 pg   MCHC 31.9 30.0 - 36.0 g/dL   RDW 14.2 11.5 - 15.5 %   Platelets 232 150 - 400 K/uL   nRBC 0.0 0.0 - 0.2 %    Comment: Performed at  [facility] [place] [address] [place], [place] [address]  Basic metabolic panel     Status: Abnormal   Collection Time: 08/06/19  4:49 AM  Result Value Ref Range   Sodium 142 135 - 145 mmol/L   Potassium 4.0 3.5 - 5.1 mmol/L   Chloride 108 98 - 111 mmol/L   CO2 27 22 - 32 mmol/L   Glucose, Bld 88 70 - 99 mg/dL    Comment: Glucose reference range applies only to samples taken after fasting for at least 8 hours.   BUN 8 8 - 23 mg/dL   Creatinine, Ser 1.16 (H) 0.44 - 1.00 mg/dL   Calcium 9.1 8.9 - 10.3 mg/dL   GFR calc non Af Amer 43 (L) >60 mL/min   GFR calc Af Amer 50 (L) >60 mL/min   Anion gap 7 5 - 15    Comment: Performed at [facility] [address], [place], [place] [address]  CBC     Status: Abnormal   Collection Time: 08/07/19  5:51 AM  Result Value Ref Range   WBC 5.3 4.0 - 10.5 K/uL   RBC 3.91 3.87 - 5.11 MIL/uL   Hemoglobin 11.4 (L) 12.0 - 15.0 g/dL   HCT 35.5 (L) 36 - 46 %   MCV 90.8 80.0 - 100.0 fL   MCH 29.2 26.0 - 34.0 pg   MCHC 32.1 30.0 - 36.0 g/dL   RDW 14.3 11.5 - 15.5 %   Platelets 228 150 - 400 K/uL   nRBC 0.0 0.0 - 0.2 %    Comment: Performed at [facility], [place] [address] [place], [place] [address]  Basic metabolic panel     Status: Abnormal   Collection Time: 08/07/19  5:51 AM  Result Value Ref Range   Sodium  140 135 - 145 mmol/L   Potassium 3.7 3.5 - 5.1 mmol/L   Chloride 107 98 - 111 mmol/L   CO2 25 22 - 32 mmol/L   Glucose, Bld 92 70 - 99 mg/dL    Comment: Glucose reference range applies only to samples taken after fasting for at least 8 hours.   BUN 7 (L) 8 - 23 mg/dL   Creatinine, Ser 1.19 (H) 0.44 - 1.00 mg/dL   Calcium 9.1 8.9 - 10.3 mg/dL   GFR calc non Af Amer 42 (L) >60 mL/min   GFR calc Af Amer 48 (L) >60 mL/min   Anion gap 8 5 - 15    Comment: Performed at [facility] [address], [place], [place] [address]   No results found.     Medical Problem List and Plan: 1.  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  Antithrombotics: -DVT/anticoagulation: Eliquis  -antiplatelet therapy: N/A 3. Pain Management: Tylenol as needed. Well controlled.  4. Mood: Provide emotional support  -antipsychotic agents: N/A 5. Neuropsych: This patient is capable of making decisions on her own behalf. 6. Skin/Wound Care: Routine skin checks 7.  Fluids/Electrolytes/Nutrition: Routine in and outs with follow-up chemistries 8.  Hypertension.  Lasix 20 mg daily, Lopressor 25 mg twice daily, verapamil 160 mg twice daily.  Monitor with increased mobility 9.  PAF.  Cardiac rate controlled.  Continue beta-blocker 10.  CKD stage III.  Creatinine baseline 1.2 on admission.  Follow-up chemistries. 11. Constipation: Patient feels constipated. Had 4 stools yesterday-one hard and three loose. Discussed with patient and daughter obtaining abdominal XR today to assess for hard mass with stool leaking around it, in which case we can clear patient out with enema or mag citrate. Patient and daughter in agreement. 12. Disposition: Patient will have support of her daughter upon discharge.   Lavon Paganini Angiulli, PA-C 08/07/2019   I have personally performed a face to face diagnostic evaluation, including, but not limited to relevant history and physical exam findings, of this patient and developed relevant assessment and plan.  Additionally, I have reviewed and concur with the physician assistant's documentation above.  Leeroy Cha, MD

## 2019-08-07 NOTE — H&P (Addendum)
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  Per chart review patient lives alone independent prior to admission.  1 level home 3 steps to entry.  Daughter plans to assist on discharge.  Presented 08/02/2019 with altered mental status.  She was found to have occlusion of right proximal M2 branch and underwent cerebral angiogram with mechanical thrombectomy with complete revascularization of right M2 MCA posterior division.  Follow-up MRI/MRA of the brain revealed widely scattered punctate infarcts in the right hemisphere from posterior corona radiata to lentiform, occasional infarct in left hemisphere and cerebellum and chronic left cerebellar PICA infarction.  Echocardiogram with ejection fraction of 60 to 65% mild concentric LVH.  Neurology follow-up maintained on Eliquis for CVA prophylaxis.  Tolerating a regular consistency diet.  Therapy evaluations completed and patient was admitted for a comprehensive rehab program.  Review of Systems  Constitutional: Negative for chills and fever.  HENT: Negative for hearing loss.   Eyes: Negative for blurred vision and double vision.  Respiratory: Negative for cough and shortness of breath.   Cardiovascular: Positive for palpitations and leg swelling.  Gastrointestinal: Positive for constipation. Negative for heartburn, nausea and vomiting.  Genitourinary: Negative for dysuria and flank pain.  Musculoskeletal: Positive for joint pain.  Skin: Negative for rash.  Neurological: Positive for weakness.  Psychiatric/Behavioral:       Mild memory loss  All other systems reviewed and are negative.  Past Medical History:  Diagnosis Date  . Atrial fibrillation ([place])   .  CAD (coronary artery disease)   . CKD (chronic kidney disease) stage 3, GFR 30-59 ml/min   . CVA (cerebral vascular accident) ([place])   . HLD (hyperlipidemia)   . HTN (hypertension)   . 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[facility])    Past Surgical History:  Procedure Laterality Date  . IR CT HEAD LTD  08/02/2019  . IR PERCUTANEOUS ART THROMBECTOMY/INFUSION INTRACRANIAL INC DIAG ANGIO  08/02/2019      . IR PERCUTANEOUS ART THROMBECTOMY/INFUSION INTRACRANIAL INC DIAG ANGIO  08/02/2019  . RADIOLOGY WITH ANESTHESIA N/A 08/02/2019   Procedure: IR WITH ANESTHESIA;  Surgeon: Luanne Bras, MD;  Location: [employer];  Service: Radiology;  Laterality: N/A;   Family History  Problem Relation Age of Onset  . Prostate cancer Father    Social History:  has no history on file for tobacco use, alcohol use, and drug use. Allergies:  Allergies  Allergen Reactions  . Aciphex [Rabeprazole]     Hives  . Aspirin     Anaphylaxis  . Hydralazine     Nausea  . Morphine And Related     Emesis   Medications Prior to Admission  Medication Sig Dispense Refill  . albuterol (VENTOLIN HFA) 108 (90 Base) MCG/ACT inhaler Inhale 2 puffs into the lungs every 4 (four) hours as needed for wheezing or shortness of breath.     Marland Kitchen apixaban (ELIQUIS) 5 MG TABS tablet Take 1 tablet (5 mg total) by mouth 2 (two) times daily. 60 tablet   . atorvastatin (LIPITOR) 40 MG tablet Take 40 mg by mouth at bedtime.    Derrill Memo ON 08/08/2019] Chlorhexidine Gluconate Cloth 2 % PADS Apply 6 each topically daily.    . Cholecalciferol (VITAMIN D) 50 MCG (2000 UT) CAPS Take 1  capsule by mouth daily.    . furosemide (LASIX) 20 MG tablet Take 20 mg by mouth daily.    . metoprolol tartrate (LOPRESSOR) 25 MG tablet Take 1 tablet (25 mg total) by mouth 2 (two) times daily.    Derrill Memo ON 08/08/2019] polyethylene glycol (MIRALAX / GLYCOLAX) 17 g packet Take 17 g by mouth daily. 14 each 0  . senna (SENOKOT) 8.6 MG tablet Take 1 tablet by mouth daily as needed for  constipation.    . sodium chloride flush (NS) 0.9 % SOLN Inject 3 mLs into the vein once for 1 dose. 3 mL 0  . triamcinolone (KENALOG) 0.1 % paste Use as directed 1 application in the mouth or throat 2 (two) times daily.    . verapamil (CALAN) 80 MG tablet Take 160 mg by mouth 2 (two) times daily.     . white petrolatum (VASELINE) OINT Apply 1 application topically as needed for lip care.  0    Drug Regimen Review Drug regimen was reviewed and remains appropriate with no significant issues identified  Home: Home Living Family/patient expects to be discharged to:: Private residence Living Arrangements: Alone Available Help at Discharge: Family Type of Home: House Home Access: Stairs to enter Technical brewer of Steps: 3 Entrance Stairs-Rails:  (unure) Home Layout: One level Bathroom Shower/Tub: Chiropodist: Standard Home Equipment: Environmental consultant - 2 wheels, Wheelchair - manual Additional Comments: RN does report after session that the patient's daughter was going to sign an apartment lease today so the patient and daughter could live together  Lives With: Alone   Functional History: Prior Function Level of Independence: Independent Comments: pt reports that she had progressed to ambulating without an assistive device since most recent stroke  Functional Status:  Mobility: Bed Mobility Overal bed mobility: Modified Independent Bed Mobility: Supine to Sit Supine to sit: Supervision General bed mobility comments: up in chair Transfers Overall transfer level: Needs assistance Equipment used: None Transfers: Sit to/from Stand Sit to Stand: Min guard Stand pivot transfers: Min assist General transfer comment: min guard for safety  Ambulation/Gait Ambulation/Gait assistance: Min guard, Min assist Gait Distance (Feet): 200 Feet Assistive device: 1 person hand held assist (assist at trunk with gait belt) Gait Pattern/deviations: Step-through pattern,  Decreased stride length, Narrow base of support, Staggering right General Gait Details: initially with shuffling gait pattern and able to improved stride length and cadence with verbal cues; assistance required at trunk with gait belt and then with gait belt and HHA +1 on return to room due to impaired balance without use of AD; pt staggers to R and with near LOB with L horizontal head turns Gait velocity: reduced Gait velocity interpretation: <1.8 ft/sec, indicate of risk for recurrent falls  ADL: ADL Overall ADL's : Needs assistance/impaired Eating/Feeding: Set up, Sitting Grooming: Set up, Sitting Upper Body Bathing: Minimal assistance, Sitting Lower Body Bathing: Moderate assistance, Sit to/from stand, Sitting/lateral leans Upper Body Dressing : Minimal assistance, Sitting Lower Body Dressing: Moderate assistance, Sit to/from stand, Sitting/lateral leans Toilet Transfer: Moderate assistance, Stand-pivot, BSC Toileting- Clothing Manipulation and Hygiene: Moderate assistance, Sitting/lateral lean, Sit to/from stand Functional mobility during ADLs: Moderate assistance, Cueing for safety, Cueing for sequencing, Rolling walker  Cognition: Cognition Overall Cognitive Status: Within Functional Limits for tasks assessed Arousal/Alertness: Awake/alert Orientation Level: Oriented X4 Attention: Sustained Sustained Attention: Appears intact Memory: Impaired Memory Impairment: Retrieval deficit, Decreased recall of new information Awareness: Impaired Awareness Impairment: Emergent impairment Problem Solving: Impaired Problem Solving Impairment: Functional  basic Safety/Judgment: Impaired Cognition Arousal/Alertness: Awake/alert Behavior During Therapy: WFL for tasks assessed/performed Overall Cognitive Status: Within Functional Limits for tasks assessed Area of Impairment: Problem solving Problem Solving: Slow processing, Difficulty sequencing, Requires verbal cues General Comments:  difficulty processing multi step commands with testing  Physical Exam: There were no vitals taken for this visit.  General: Alert and oriented x 3, No apparent distress HEENT: Head is normocephalic, atraumatic, PERRLA, EOMI, sclera anicteric, oral mucosa pink and moist, dentition intact, ext ear canals clear,  Neck: Supple without JVD or lymphadenopathy Heart: Reg rate and rhythm. No murmurs rubs or gallops Chest: CTA bilaterally without wheezes, rales, or rhonchi; no distress Abdomen: Soft, non-tender, non-distended, bowel sounds positive. Extremities: No clubbing, cyanosis, or edema. Pulses are 2+ Skin: Clean and intact without signs of breakdown Neuro: Patient is alert in no acute distress.  Makes good eye contact with examiner follows commands.  Fair awareness of deficits. 5/5 strength throughout. Sensation intact throughout.  Psych: Pt's affect is appropriate. Pt is cooperative  Results for orders placed or performed during the hospital encounter of 08/02/19 (from the past 48 hour(s))  CBC     Status: Abnormal   Collection Time: 08/06/19  4:49 AM  Result Value Ref Range   WBC 5.6 4.0 - 10.5 K/uL   RBC 4.03 3.87 - 5.11 MIL/uL   Hemoglobin 11.6 (L) 12.0 - 15.0 g/dL   HCT 36.4 36 - 46 %   MCV 90.3 80.0 - 100.0 fL   MCH 28.8 26.0 - 34.0 pg   MCHC 31.9 30.0 - 36.0 g/dL   RDW 14.2 11.5 - 15.5 %   Platelets 232 150 - 400 K/uL   nRBC 0.0 0.0 - 0.2 %    Comment: Performed at [facility], [place] [address], [place], [place] [address]  Basic metabolic panel     Status: Abnormal   Collection Time: 08/06/19  4:49 AM  Result Value Ref Range   Sodium 142 135 - 145 mmol/L   Potassium 4.0 3.5 - 5.1 mmol/L   Chloride 108 98 - 111 mmol/L   CO2 27 22 - 32 mmol/L   Glucose, Bld 88 70 - 99 mg/dL    Comment: Glucose reference range applies only to samples taken after fasting for at least 8 hours.   BUN 8 8 - 23 mg/dL   Creatinine, Ser 1.16 (H) 0.44 - 1.00 mg/dL   Calcium 9.1 8.9 - 10.3  mg/dL   GFR calc non Af Amer 43 (L) >60 mL/min   GFR calc Af Amer 50 (L) >60 mL/min   Anion gap 7 5 - 15    Comment: Performed at [facility] [address], [place], [place] [address]  CBC     Status: Abnormal   Collection Time: 08/07/19  5:51 AM  Result Value Ref Range   WBC 5.3 4.0 - 10.5 K/uL   RBC 3.91 3.87 - 5.11 MIL/uL   Hemoglobin 11.4 (L) 12.0 - 15.0 g/dL   HCT 35.5 (L) 36 - 46 %   MCV 90.8 80.0 - 100.0 fL   MCH 29.2 26.0 - 34.0 pg   MCHC 32.1 30.0 - 36.0 g/dL   RDW 14.3 11.5 - 15.5 %   Platelets 228 150 - 400 K/uL   nRBC 0.0 0.0 - 0.2 %    Comment: Performed at [facility], [place] [address], [place], [place] [address]  Basic metabolic panel     Status: Abnormal  Collection Time: 08/07/19  5:51 AM  Result Value Ref Range   Sodium 140 135 - 145 mmol/L   Potassium 3.7 3.5 - 5.1 mmol/L   Chloride 107 98 - 111 mmol/L   CO2 25 22 - 32 mmol/L   Glucose, Bld 92 70 - 99 mg/dL    Comment: Glucose reference range applies only to samples taken after fasting for at least 8 hours.   BUN 7 (L) 8 - 23 mg/dL   Creatinine, Ser 1.19 (H) 0.44 - 1.00 mg/dL   Calcium 9.1 8.9 - 10.3 mg/dL   GFR calc non Af Amer 42 (L) >60 mL/min   GFR calc Af Amer 48 (L) >60 mL/min   Anion gap 8 5 - 15    Comment: Performed at [facility] [address], [place], [place] [address]  Hemoglobin A1c     Status: None   Collection Time: 08/07/19  5:51 AM  Result Value Ref Range   Hgb A1c MFr Bld 5.6 4.8 - 5.6 %    Comment: (NOTE) Pre diabetes:          5.7%-6.4%  Diabetes:              >6.4%  Glycemic control for   <7.0% adults with diabetes    Mean Plasma Glucose 114.02 mg/dL    Comment: Performed at [facility] [address], [place], [place] [address]   No results found.     Medical Problem List and Plan: 1.  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  Antithrombotics: -DVT/anticoagulation: Eliquis  -antiplatelet therapy: N/A 3. Pain Management: Tylenol as needed. Well controlled.  4. Mood: Provide emotional support  -antipsychotic agents: N/A 5. Neuropsych: This patient is capable of making decisions on her own behalf. 6. Skin/Wound Care: Routine skin checks 7. Fluids/Electrolytes/Nutrition: Routine in and outs with follow-up chemistries 8.  Hypertension.  Lasix 20 mg daily, Lopressor 25 mg twice daily, verapamil 160 mg twice daily.  Monitor with increased mobility 9.  PAF.  Cardiac rate controlled.  Continue beta-blocker 10.  CKD stage III.  Creatinine baseline 1.2 on admission.  Follow-up chemistries. 11. Constipation: Patient feels constipated. Had 4 stools yesterday-one hard and three loose. Discussed with patient and daughter obtaining abdominal XR today to assess for hard mass with stool leaking around it, in which case we can clear patient out with enema or mag citrate. Patient and daughter in agreement. Addendum: XR shows moderate stool in the colon. Will order q24 PRN enema. 12. Disposition: Patient will have support of her daughter upon discharge.   Helyn Numbers, PA-C  I have personally performed a face to face diagnostic evaluation, including, but not limited to relevant history and physical exam findings, of this patient and developed relevant assessment and plan.  Additionally, I have reviewed and concur with the physician assistant's documentation above.  The patient's status has not changed. The original post admission physician evaluation remains appropriate, and any changes from the pre-admission screening or documentation from the acute chart are noted above.   Leeroy Cha, MD

## 2019-08-07 NOTE — Plan of Care (Signed)
  Problem: RH SAFETY Goal: RH STG ADHERE TO SAFETY PRECAUTIONS W/ASSISTANCE/DEVICE Description: STG Adhere to Safety Precautions With Supervision Outcome: Progressing Goal: RH STG DECREASED RISK OF FALL WITH ASSISTANCE Description: STG Decreased Risk of Fall With Supervision Outcome: Progressing  Problem: RH COGNITION-NURSING Goal: RH STG USES MEMORY AIDS/STRATEGIES W/ASSIST TO PROBLEM SOLVE Description: STG Uses Memory Aids/Strategies With Mod I to Problem Solve. Outcome: Progressing

## 2019-08-07 NOTE — Progress Notes (Signed)
Inpatient Rehabilitation-Admissions Coordinator   I have received medical clearance from attending service for admit to CIR today. Notified pt and her daughter of bed offer and they have accepted. Reviewed insurance benefits letter and consent forms with pt and her daughter. All questions answered.   RN and [facility] team notified of plan for admit today.   Raechel Ache, OTR/L  Rehab Admissions Coordinator  [phone] 08/07/2019 1:23 PM

## 2019-08-08 ENCOUNTER — Inpatient Hospital Stay (HOSPITAL_COMMUNITY): Payer: PRIVATE HEALTH INSURANCE

## 2019-08-08 ENCOUNTER — Inpatient Hospital Stay (HOSPITAL_COMMUNITY): Payer: PRIVATE HEALTH INSURANCE | Admitting: Occupational Therapy

## 2019-08-08 DIAGNOSIS — N1832 Chronic kidney disease, stage 3b: Secondary | ICD-10-CM

## 2019-08-08 DIAGNOSIS — E8809 Other disorders of plasma-protein metabolism, not elsewhere classified: Secondary | ICD-10-CM

## 2019-08-08 DIAGNOSIS — I10 Essential (primary) hypertension: Secondary | ICD-10-CM

## 2019-08-08 DIAGNOSIS — K5900 Constipation, unspecified: Secondary | ICD-10-CM

## 2019-08-08 DIAGNOSIS — E46 Unspecified protein-calorie malnutrition: Secondary | ICD-10-CM

## 2019-08-08 DIAGNOSIS — K5901 Slow transit constipation: Secondary | ICD-10-CM

## 2019-08-08 DIAGNOSIS — D62 Acute posthemorrhagic anemia: Secondary | ICD-10-CM

## 2019-08-08 DIAGNOSIS — I63511 Cerebral infarction due to unspecified occlusion or stenosis of right middle cerebral artery: Secondary | ICD-10-CM

## 2019-08-08 LAB — CBC WITH DIFFERENTIAL/PLATELET
Abs Immature Granulocytes: 0.04 10*3/uL (ref 0.00–0.07)
Basophils Absolute: 0 10*3/uL (ref 0.0–0.1)
Basophils Relative: 1 %
Eosinophils Absolute: 0.1 10*3/uL (ref 0.0–0.5)
Eosinophils Relative: 2 %
HCT: 36 % (ref 36.0–46.0)
Hemoglobin: 11.6 g/dL — ABNORMAL LOW (ref 12.0–15.0)
Immature Granulocytes: 1 %
Lymphocytes Relative: 25 %
Lymphs Abs: 1.4 10*3/uL (ref 0.7–4.0)
MCH: 29 pg (ref 26.0–34.0)
MCHC: 32.2 g/dL (ref 30.0–36.0)
MCV: 90 fL (ref 80.0–100.0)
Monocytes Absolute: 0.6 10*3/uL (ref 0.1–1.0)
Monocytes Relative: 11 %
Neutro Abs: 3.3 10*3/uL (ref 1.7–7.7)
Neutrophils Relative %: 60 %
Platelets: 205 10*3/uL (ref 150–400)
RBC: 4 MIL/uL (ref 3.87–5.11)
RDW: 14.3 % (ref 11.5–15.5)
WBC: 5.5 10*3/uL (ref 4.0–10.5)
nRBC: 0 % (ref 0.0–0.2)

## 2019-08-08 LAB — COMPREHENSIVE METABOLIC PANEL
ALT: 21 U/L (ref 0–44)
AST: 24 U/L (ref 15–41)
Albumin: 3 g/dL — ABNORMAL LOW (ref 3.5–5.0)
Alkaline Phosphatase: 70 U/L (ref 38–126)
Anion gap: 11 (ref 5–15)
BUN: 7 mg/dL — ABNORMAL LOW (ref 8–23)
CO2: 24 mmol/L (ref 22–32)
Calcium: 9.4 mg/dL (ref 8.9–10.3)
Chloride: 106 mmol/L (ref 98–111)
Creatinine, Ser: 1.15 mg/dL — ABNORMAL HIGH (ref 0.44–1.00)
GFR calc Af Amer: 50 mL/min — ABNORMAL LOW (ref >60)
GFR calc non Af Amer: 43 mL/min — ABNORMAL LOW (ref >60)
Glucose, Bld: 96 mg/dL (ref 70–99)
Potassium: 4 mmol/L (ref 3.5–5.1)
Sodium: 141 mmol/L (ref 135–145)
Total Bilirubin: 0.7 mg/dL (ref 0.3–1.2)
Total Protein: 5.7 g/dL — ABNORMAL LOW (ref 6.5–8.1)

## 2019-08-08 MED ORDER — POLYETHYLENE GLYCOL 3350 17 G PO PACK
17.0000 g | PACK | Freq: Two times a day (BID) | ORAL | Status: DC
  Administered 2019-08-08 – 2019-08-09 (×3): 17 g via ORAL
  Filled 2019-08-08 (×4): qty 1

## 2019-08-08 MED ORDER — PRO-STAT SUGAR FREE PO LIQD
30.0000 mL | Freq: Two times a day (BID) | ORAL | Status: DC
  Administered 2019-08-08 – 2019-08-10 (×5): 30 mL via ORAL
  Filled 2019-08-08 (×4): qty 30

## 2019-08-08 NOTE — Evaluation (Signed)
Occupational Therapy Assessment and Plan  Patient Details  Name: Jodi [name] MRN: [id number] Date of Birth: 1932/08/28  OT Diagnosis: muscle weakness (generalized) Rehab Potential: Rehab Potential (ACUTE ONLY): Excellent ELOS: 2-3 days   Today's Date: 08/08/2019 OT Individual Time: 1191-4782 OT Individual Time Calculation (min): 75 min     Hospital Problem: Principal Problem:   Right middle cerebral artery stroke (DISH) Active Problems:   Acute blood loss anemia   Constipation   Hypoalbuminemia due to protein-calorie malnutrition [facility])   Past Medical History:  Past Medical History:  Diagnosis Date  . Atrial fibrillation ([place])   . CAD (coronary artery disease)   . CKD (chronic kidney disease) stage 3, GFR 30-59 ml/min   . CVA (cerebral vascular accident) ([place])   . HLD (hyperlipidemia)   . HTN (hypertension)   . 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[facility])    Past Surgical History:  Past Surgical History:  Procedure Laterality Date  . IR CT HEAD LTD  08/02/2019  . IR PERCUTANEOUS ART THROMBECTOMY/INFUSION INTRACRANIAL INC DIAG ANGIO  08/02/2019      . IR PERCUTANEOUS ART THROMBECTOMY/INFUSION INTRACRANIAL INC DIAG ANGIO  08/02/2019  . 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 Per chart review patient lives alone independent prior to admission. 1 level home 3 steps to entry. Daughter plans to assist on discharge. Presented 08/02/2019 with altered mental status. She was found to have occlusion of right proximal M2 branch and underwent cerebral angiogram with mechanical thrombectomy with complete revascularization of right M2 MCA posterior division.  Follow-up MRI/MRA of the brain revealed widely scattered punctate infarcts in the right hemisphere from posterior corona radiata to lentiform, occasional infarct in left hemisphere and cerebellum and chronic left cerebellar PICA infarction. Echocardiogram with ejection fraction of 60 to 65% mild concentric LVH. Neurology follow-up maintained on Eliquis for CVA prophylaxis. Tolerating a regular consistency diet. Therapy evaluations completed and patient was admitted for a comprehensive rehab program.    Patient transferred to CIR on 08/07/2019 .    Patient currently requires supervision with basic self-care skills secondary to muscle weakness and decreased standing balance and decreased balance strategies.  Prior to hospitalization, patient was fully independent and living alone.   Patient will benefit from skilled intervention to increase independence with basic self-care skills prior to discharge home with care partner.  Anticipate patient will require intermittent supervision and no further OT follow recommended.  OT - End of Session Endurance Deficit: No OT Assessment Rehab Potential (ACUTE ONLY): Excellent OT Patient demonstrates impairments in the following area(s): Balance OT Basic ADL's Functional Problem(s): Toileting;Dressing;Bathing;Grooming OT Advanced ADL's Functional Problem(s): Simple Meal Preparation OT Transfers Functional Problem(s): Toilet;Tub/Shower OT Additional Impairment(s): None OT Plan OT Intensity: Minimum of 1-2 x/day, 45 to 90 minutes OT Frequency: 5 out of 7 days OT Duration/Estimated Length of Stay: 2-3 days OT Treatment/Interventions: Balance/vestibular training;Discharge planning;Functional mobility training;DME/adaptive equipment instruction;Neuromuscular re-education;Patient/family education;Psychosocial support;Therapeutic Activities;UE/LE Coordination activities;UE/LE Strength taining/ROM;Therapeutic Exercise OT Self Feeding Anticipated Outcome(s): no goal, pt  is I OT Basic Self-Care Anticipated Outcome(s): Mod I OT Toileting Anticipated Outcome(s): Mod I OT Bathroom Transfers Anticipated Outcome(s): Mod I OT Recommendation Patient destination: Home Follow Up Recommendations: None Equipment Recommended: Tub/shower  seat Equipment Details: daughter will purchase seat on her own   Skilled Therapeutic Intervention Pt seen for initial evaluation and ADL training.  Pt received in bed. Alert and ready for therapy.  When asked what felt different to her about her body after the strokes, she stated "I am walking slower than I am used to and my legs just do not feel as steady".  Pt moved at a very efficient rate this morning, getting out of bed, walking to toilet, using bathroom, standing at sink for over 30 minutes to take care of dentures, shave, bathing and then dressed with a cue to sit down with doffing pants over feet.  She donned socks and shoes. ALL tasks required S only.    Did not apply TED hose as pt has no edema and is very mobile.  Pt used RW to walk over 150 ft to gym with S.  In gym, climbed up/down 3 stairs with R railing with CGA.  Sat in chair in gym for visual assessment as pt did slightly bump edge of RW into obstacle on floor 2x.   With testing of visual fields, pt did not see target until 45 degrees of each field.  Also visual tracking required cue not to turn head and slight evidence of eye jump with tracking.  Pt reports she has cataracts impeding clarity of vision, but otherwise does not feel she has visual impairments.  Ambulated back to room. Daughter arrived. Discussed possible LOS, goals, d/c recommendations, and shower chair recommendation.  Pt resting in recliner with chair pad alarm on and all needs met.   OT Evaluation Precautions/Restrictions  Precautions Precautions: Fall Restrictions Weight Bearing Restrictions: No Pain Pain Assessment Pain Scale: 0-10 Pain Score: 0-No pain Home Living/Prior Functioning Home  Living Family/patient expects to be discharged to:: Private residence Living Arrangements: Alone Available Help at Discharge: Family Type of Home: House Home Access: Stairs to enter Technical brewer of Steps: 3 Entrance Stairs-Rails: Right Home Layout: One level Bathroom Shower/Tub: Chiropodist: Standard Additional Comments: daughter will move in with her for 2 weeks and then they will get their own apt.  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 Comments: pt reports that she had progressed to ambulating without an assistive device since most recent stroke; she uses a RW when going to doctors and a cane around the house most of the time ADL  supervision with all self care Vision Baseline Vision/History: Cataracts Patient Visual Report: No change from baseline Vision Assessment?: No apparent visual deficits Additional Comments: on testing, she appears to have B field cuts but it could also be because of blurriness due to cataracts Perception  Perception: Within Functional Limits Praxis Praxis: Intact Cognition Overall Cognitive Status: Within Functional Limits for tasks assessed Arousal/Alertness: Awake/alert Orientation Level: Person;Place;Situation Person: Oriented Place: Oriented Situation: Oriented Year: 2021 Month: June Day of Week: Correct Memory: Appears intact Immediate Memory Recall: Sock;Blue;Bed Memory Recall Sock: Without Cue Memory Recall Blue: Without Cue Memory Recall Bed: Without Cue Sustained Attention: Appears intact Awareness: Appears intact Problem Solving: Appears intact Safety/Judgment: Appears intact Sensation Sensation Light Touch: Appears Intact Hot/Cold: Appears Intact Proprioception:  Appears Intact Stereognosis: Appears Intact Coordination Gross Motor Movements are Fluid and Coordinated: Yes Fine Motor Movements are Fluid and Coordinated: Yes Motor  Motor Motor: Within Functional  Limits Motor - Skilled Clinical Observations: pt complains of generalized weakness in her LEs and a general feeling of unsteadiness Mobility    supervision with RW Trunk/Postural Assessment  Cervical Assessment Cervical Assessment: Within Functional Limits Thoracic Assessment Thoracic Assessment: Within Functional Limits Lumbar Assessment Lumbar Assessment: Within Functional Limits Postural Control Postural Control: Within Functional Limits  Balance Dynamic Sitting Balance Dynamic Sitting - Level of Assistance: 7: Independent Static Standing Balance Static Standing - Level of Assistance: 5: Stand by assistance Dynamic Standing Balance Dynamic Standing - Level of Assistance: 5: Stand by assistance Extremity/Trunk Assessment RUE Assessment RUE Assessment: Within Functional Limits LUE Assessment LUE Assessment: Within Functional Limits     Refer to Care Plan for Long Term Goals  Recommendations for other services: None    Discharge Criteria: Patient will be discharged from OT if patient refuses treatment 3 consecutive times without medical reason, if treatment goals not met, if there is a change in medical status, if patient makes no progress towards goals or if patient is discharged from hospital.  The above assessment, treatment plan, treatment alternatives and goals were discussed and mutually agreed upon: by patient and by family  Lenox Ladouceur 08/08/2019, 12:11 PM

## 2019-08-08 NOTE — Patient Care Conference (Signed)
Inpatient RehabilitationTeam Conference and Plan of Care Update Date: 08/08/2019   Time: 3:04 PM    Patient Name: Jodi [name]      Medical Record Number: [id number]  Date of Birth: 19-Jun-1932 Sex: Female         Room/Bed: 4M08C/4M08C-01 Payor Info: Payor: MEDICARE RAILROAD / Plan: MEDICARE RAILROAD / Product Type: *No Product type* /    Admit Date/Time:  08/07/2019  3:32 PM  Primary Diagnosis:  Right middle cerebral artery stroke [facility])  Patient Active Problem List   Diagnosis Date Noted  . Acute blood loss anemia   . Constipation   . Hypoalbuminemia due to protein-calorie malnutrition ([place])   . Atrial fibrillation ([place]) 08/07/2019  . History of embolic stroke 97/98/9211  . Essential hypertension 08/07/2019  . Hyperlipidemia 08/07/2019  . CKD (chronic kidney disease), stage IIIb 08/07/2019  . Right middle cerebral artery stroke ([place]) 08/07/2019  . Acute cardioembolic stroke (HCC) d/t AF, s/p clot retrieval,  08/02/2019    Expected Discharge Date: Expected Discharge Date:  (Short ELOS)  Team Members Present: Physician leading conference: Dr. Delice Lesch Care Coodinator Present: Dorien Chihuahua, RN, BSN, CRRN;Christina Sampson Goon, [place] Nurse Present: Debroah Loop, RN PT Present: Deniece Ree, PT OT Present: Meriel Pica, OT PPS Coordinator present : Gunnar Fusi, SLP     Current Status/Progress Goal Weekly Team Focus  Bowel/Bladder   Pt is continent of B/B, LBM 08/07/19  Pt will remain continent of b/b  Q2h toileting/PRN   Swallow/Nutrition/ Hydration   [facility]         ADL's   supervision overall with RW for support  Mod I, except for S tub transfers  Pt/family education, functional mobility and balance, ADL training   Mobility             Communication   [facility]         Safety/Cognition/ Behavioral Observations  WFL         Pain   Pt denies being in pain at this time  Pt will remain pain free  Assess pain Qshift/PRN   Skin   NO obious signs of skin breakdown or  infection at this time  Pt will remain free of infection/breakdown  Assess skin Qshift/PRN      *See Care Plan and progress notes for long and short-term goals.     Barriers to Discharge  Current Status/Progress Possible Resolutions Date Resolved   Nursing  Inaccessible home environment;Decreased caregiver support;Medication compliance               PT                    OT                  SLP                Care Coordinator                Discharge Planning/Teaching Needs:       TBD  Team Discussion:  Initial evaluations incomplete. OT concerned about field cut vs cataract limiting vision. Constipation being addressed. Anticipate short LOS  Revisions to Treatment Plan:  None     Medical Summary Current Status: Decreased functional mobility with altered mental status  secondary to right MCA small punctate/scattered infarcts with punctate left MCA ACA and right cerebellar infarcts with right M2 occlusion status post revascularization as well as recent right cerebellar CVA 07/22/2019 with evaluation at [facility] Weekly Focus/Goal: Improve  mobility, BP  Barriers to Discharge: Medical stability   Possible Resolutions to Barriers: Therapies, optimize BP meds   Continued Need for Acute Rehabilitation Level of Care: The patient requires daily medical management by a physician with specialized training in physical medicine and rehabilitation for the following reasons: Direction of a multidisciplinary physical rehabilitation program to maximize functional independence : Yes Medical management of patient stability for increased activity during participation in an intensive rehabilitation regime.: Yes Analysis of laboratory values and/or radiology reports with any subsequent need for medication adjustment and/or medical intervention. : Yes   I attest that I was present, lead the team conference, and concur with the assessment and plan of the team.   Dorien Chihuahua B 08/08/2019, 3:04 PM

## 2019-08-08 NOTE — Evaluation (Addendum)
Physical Therapy Assessment and Plan  Patient Details  Name: Jodi [name] MRN: [id number] Date of Birth: 05-18-1932  PT Diagnosis: Abnormal posture, Abnormality of gait and Difficulty walking Rehab Potential: Excellent ELOS: 2-3 days   Today's Date: 08/08/2019 PT Individual Time: 1310-1410 PT Individual Time Calculation (min): 60 min    Hospital Problem: Principal Problem:   Right middle cerebral artery stroke ([place]) Active Problems:   Acute blood loss anemia   Constipation   Hypoalbuminemia due to protein-calorie malnutrition [facility])   Past Medical History:  Past Medical History:  Diagnosis Date  . Atrial fibrillation ([place])   . CAD (coronary artery disease)   . CKD (chronic kidney disease) stage 3, GFR 30-59 ml/min   . CVA (cerebral vascular accident) ([place])   . HLD (hyperlipidemia)   . HTN (hypertension)   . 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[facility])    Past Surgical History:  Past Surgical History:  Procedure Laterality Date  . IR CT HEAD LTD  08/02/2019  . IR PERCUTANEOUS ART THROMBECTOMY/INFUSION INTRACRANIAL INC DIAG ANGIO  08/02/2019      . IR PERCUTANEOUS ART THROMBECTOMY/INFUSION INTRACRANIAL INC DIAG ANGIO  08/02/2019  . RADIOLOGY WITH ANESTHESIA N/A 08/02/2019   Procedure: IR WITH ANESTHESIA;  Surgeon: Luanne Bras, MD;  Location: [place];  Service: Radiology;  Laterality: N/A;    Assessment & Plan Clinical Impression: Patient is a 86 y.o. year old  left-handed female with history of hypertension, CKD stage III with admission creatinine 1.20, CAD, PAF, right cerebellar CVA 07/22/2019 DC'd from [facility] on Plavix.  Per chart review patient lives alone independent prior to admission.  1 level home 3 steps to entry.  Daughter plans to assist on discharge.  Presented 08/02/2019 with altered mental status.  She was found to have occlusion of right proximal M2 branch and underwent cerebral angiogram with mechanical thrombectomy with complete revascularization of right M2 MCA posterior division.   Follow-up MRI/MRA of the brain revealed widely scattered punctate infarcts in the right hemisphere from posterior corona radiata to lentiform, occasional infarct in left hemisphere and cerebellum and chronic left cerebellar PICA infarction.  Echocardiogram with ejection fraction of 60 to 65% mild concentric LVH.  Neurology follow-up maintained on Eliquis for CVA prophylaxis.  Tolerating a regular consistency diet.  Therapy evaluations completed and patient was admitted for a comprehensive rehab program. Patient transferred to CIR on 08/07/2019 .   Patient currently requires min with mobility secondary to muscle weakness, decreased cardiorespiratoy endurance and decreased standing balance, decreased postural control and decreased balance strategies.  Prior to hospitalization, patient was modified independent  with mobility and lived with Alone in a House home.  Home access is 3Stairs to enter.  Patient will benefit from skilled PT intervention to maximize safe functional mobility, minimize fall risk and decrease caregiver burden for planned discharge home with intermittent assist.  Anticipate patient will benefit from follow up OP at discharge.  PT - End of Session Activity Tolerance: Tolerates 30+ min activity with multiple rests Endurance Deficit: Yes PT Assessment Rehab Potential (ACUTE/IP ONLY): Excellent PT Barriers to Discharge: Inaccessible home environment;Lack of/limited family support PT Barriers to Discharge Comments: 3 STE 1 rail to home and new apartment, patient's daught able to provide intermittent supervision at d/c PT Patient demonstrates impairments in the following area(s): Balance;Safety;Endurance;Motor PT Transfers Functional Problem(s): Bed Mobility;Bed to Chair;Car;Furniture;Floor PT Locomotion Functional Problem(s): Ambulation;Wheelchair Mobility;Stairs PT Plan PT Intensity: Minimum of 1-2 x/day ,45 to 90 minutes PT Frequency: 5 out of 7 days PT Duration Estimated Length  of  Stay: 2-3 days PT Treatment/Interventions: Ambulation/gait training;Community reintegration;DME/adaptive equipment instruction;Neuromuscular re-education;Psychosocial support;Stair training;UE/LE Strength taining/ROM;Balance/vestibular training;Discharge planning;Functional electrical stimulation;Pain management;Skin care/wound management;Therapeutic Activities;UE/LE Coordination activities;Cognitive remediation/compensation;Disease management/prevention;Functional mobility training;Patient/family education;Splinting/orthotics;Therapeutic Exercise;Visual/perceptual remediation/compensation PT Transfers Anticipated Outcome(s): mod I PT Locomotion Anticipated Outcome(s): mod I household distances using LRAD, supervision community mobility PT Recommendation Follow Up Recommendations: Outpatient PT Patient destination: Home Equipment Recommended: None recommended by PT Equipment Details: Patient has a RW and [facility] Skilled Therapeutic Intervention In addition to the PT evaluation below, the patient performed the following skilled PT interventions: Patient in recliner in the room upon PT arrival. Patient alert and agreeable to PT session. Patient denied pain during session.  Therapeutic Activity: Bed Mobility: Patient performed rolling L/R and supine to/from sit independently on a mat table.  Transfers: Patient performed sit to/from stand x6 with and without RW with supervision for safety. Provided verbal cues for hand placement on RW and reaching back to sit for safety with RW. Patient performed a simulated van height car transfer with 4" step-up with min A using car frame for support.   Five times Sit to Stand Test (FTSS) Method: Use a straight back chair with a solid seat that is 16-18" high. Ask participant to sit on the chair with arms folded across their chest.   Instructions: "Stand up and sit down as quickly as possible 5 times, keeping your arms folded across your chest."   Measurement: Stop  timing when the participant stands the 5th time.  TIME: ___25.0___ (in seconds)  Times > 13.6 seconds is associated with increased disability and morbidity (Guralnik, 2000) Times > 15 seconds is predictive of recurrent falls in healthy individuals aged 40 and older (Buatois, et al., 2008) Normal performance values in community dwelling individuals aged 18 and older (Bohannon, 2006): o 60-69 years: 11.4 seconds o 70-79 years: 12.6 seconds o 80-89 years: 14.8 seconds  MCID: ? 2.3 seconds for Vestibular Disorders (Meretta, 2006)  Gait Training:  Patient ambulated 160 feet and 100 feet without AD with CGA-min A for balance steadying support. Ambulated as described below. Provided verbal cues for increased step height, increased step length, and increased gait speed following initial gait assessment. Patient ambulated up/down a ramp, over 10 feet of mulch (unlevel surface), and up/down a curb to simulate community ambulation over unlevel surfaces with min A using HHA. Patient ascended/descended 12 steps using R rail with CGA. Performed step-to gait pattern.  6 Min Walk Test:  Instructed patient to ambulate as quickling and as safely as possible for 6 minutes using LRAD. Patient was allowed to take standing rest breaks without stopping the test, but if he required a sitting rest break the clock would be stopped and the test would be over.  Results: 506 feet using a RW with close supervision for safety, RPE 4/10 after  Neuromuscular Re-ed: Patient performed the [facility] Scale, see details below: Patient demonstrates increased fall risk as noted by score of 43/56 on Berg Balance Scale.  (<36= high risk for falls, close to 100%; 37-45 significant >80%; 46-51 moderate >50%; 52-55 lower >25%)  Patient in recliner at end of session with breaks locked, chair alarm set, and all needs within reach.   Instructed pt in results of PT evaluation as detailed below, PT POC, rehab potential, rehab goals,  and discharge recommendations. Additionally discussed CIR's policies regarding fall safety and use of chair alarm and/or quick release belt. Pt verbalized understanding and in agreement. Will update pt's family members as they become  available.    PT Evaluation Precautions/Restrictions Precautions Precautions: Fall Living/Prior Functioning Home Living Available Help at Discharge: Family Type of Home: House Home Access: Stairs to enter Technical brewer of Steps: 3 Entrance Stairs-Rails: Right Home Layout: One level Bathroom Shower/Tub: Chiropodist: Standard Additional Comments: daughter will move in with her for 2 weeks and then they will get their own apt.  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 Comments: pt reports that she had progressed to ambulating without an assistive device since most recent stroke; she uses a RW when going to doctors and a cane around the house most of the time Vision/Perception  Perception Perception: Within Functional Limits Praxis Praxis: Intact  Cognition Overall Cognitive Status: Within Functional Limits for tasks assessed Arousal/Alertness: Awake/alert Orientation Level: Oriented X4 Sustained Attention: Appears intact Memory: Appears intact Awareness: Appears intact Problem Solving: Appears intact Safety/Judgment: Appears intact Sensation Sensation Light Touch: Appears Intact Proprioception: Appears Intact Coordination Gross Motor Movements are Fluid and Coordinated: No Fine Motor Movements are Fluid and Coordinated: Yes Motor  Motor Motor: Within Functional Limits Motor - Skilled Clinical Observations: pt complains of generalized weakness in her LEs and a general feeling of  unsteadiness  Mobility Bed Mobility Bed Mobility: Rolling Right;Rolling Left;Supine to Sit;Sit to Supine Rolling Right: Independent Rolling Left: Independent Supine to Sit: Independent Sit to Supine: Independent Transfers Transfers: Sit to Stand;Stand to Sit;Stand Pivot Transfers Sit to Stand: Supervision/Verbal cueing Stand to Sit: Supervision/Verbal cueing Stand Pivot Transfers: Supervision/Verbal cueing Transfer (Assistive device): None Locomotion  Gait Ambulation: Yes Gait Assistance: Minimal Assistance - Patient > 75% Gait Distance (Feet): 160 Feet Assistive device: None Gait Gait: Yes Gait Pattern: Decreased stride length;Decreased hip/knee flexion - right;Decreased hip/knee flexion - left;Right foot flat;Left foot flat;Decreased trunk rotation;Narrow base of support Gait velocity: decreased Stairs / Additional Locomotion Stairs: Yes Stairs Assistance: Contact Guard/Touching assist Stair Management Technique: One rail Right;Step to pattern Number of Stairs: 12 Height of Stairs: 6 Ramp: Minimal Assistance - Patient >75% Curb: Minimal Assistance - Patient >75% Wheelchair Mobility 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, petroleum)  Trunk/Postural Assessment  Cervical Assessment Cervical Assessment: Within Functional Limits Thoracic Assessment Thoracic Assessment: Within Functional Limits Lumbar Assessment Lumbar Assessment: Within Functional Limits Postural Control Postural Control: Within Functional Limits  Balance Standardized Balance Assessment Standardized Balance Assessment: Berg Balance Test Berg Balance Test Sit to Stand: Able to stand without using hands and stabilize independently Standing Unsupported: Able to stand safely 2 minutes Sitting with Back Unsupported but Feet Supported on Floor or Stool: Able to sit safely and securely 2 minutes Stand to Sit: Sits safely with minimal use of hands Transfers: Able to transfer safely, minor use of  hands Standing Unsupported with Eyes Closed: Able to stand 10 seconds safely Standing Ubsupported with Feet Together: Able to place feet together independently and stand for 1 minute with supervision From Standing, Reach Forward with Outstretched Arm: Can reach forward >12 cm safely (5") From Standing Position, Pick up Object from Floor: Able to pick up shoe, needs supervision From Standing Position, Turn to Look Behind Over each Shoulder: Looks behind one side only/other side shows less weight shift Turn 360 Degrees: Able to turn 360 degrees safely but slowly Standing Unsupported, Alternately Place Feet on Step/Stool: Able to complete >2 steps/needs minimal assist Standing Unsupported, One Foot in Front:  Able to take small step independently and hold 30 seconds Standing on One Leg: Able to lift leg independently and hold equal to or more than 3 seconds Total Score: 43 Dynamic Standing Balance Dynamic Standing - Level of Assistance: 5: Stand by assistance Extremity Assessment  RUE Assessment RUE Assessment: Within Functional Limits LUE Assessment LUE Assessment: Within Functional Limits RLE Assessment RLE Assessment: Exceptions to [facility] Active Range of Motion (AROM) Comments: WFL for all functional mobility General Strength Comments: Grossly in sitting: 5/5 throughout except hip flexion and DF 4/5 LLE Assessment LLE Assessment: Exceptions to [facility] Active Range of Motion (AROM) Comments: WFL for all functional mobility General Strength Comments: Grossly in sitting: 5/5 throughout except hip flexion and DF 4/5    Refer to Care Plan for Long Term Goals  Recommendations for other services: None   Discharge Criteria: Patient will be discharged from PT if patient refuses treatment 3 consecutive times without medical reason, if treatment goals not met, if there is a change in medical status, if patient makes no progress towards goals or if patient is discharged from hospital.  The above  assessment, treatment plan, treatment alternatives and goals were discussed and mutually agreed upon: by patient  Doreene Burke PT, DPT  08/08/2019, 6:05 PM

## 2019-08-08 NOTE — Progress Notes (Signed)
Inpatient [place] Individual Statement of Services  Patient Name:  Jodi [name]  Date:  08/08/2019  Welcome to the [facility].  Our goal is to provide you with an individualized program based on your diagnosis and situation, designed to meet your specific needs.  With this comprehensive rehabilitation program, you will be expected to participate in at least 3 hours of rehabilitation therapies Monday-Friday, with modified therapy programming on the weekends.  Your rehabilitation program will include the following services:  Physical Therapy (PT), Occupational Therapy (OT), Speech Therapy (ST), 24 hour per day rehabilitation nursing, Therapeutic Recreaction (TR), Neuropsychology, Care Coordinator, Rehabilitation Medicine, Nutrition Services, Pharmacy Services and Other  Weekly team conferences will be held on Wednesdays to discuss your progress.  Your Inpatient Rehabilitation Care Coordinator will talk with you frequently to get your input and to update you on team discussions.  Team conferences with you and your family in attendance may also be held.  Expected length of stay: 5-7 Days  Overall anticipated outcome: MOD I/Supervision  Depending on your progress and recovery, your program may change. Your Inpatient Rehabilitation Care Coordinator will coordinate services and will keep you informed of any changes. Your Inpatient Rehabilitation Care Coordinator's name and contact numbers are listed  below.  The following services may also be recommended but are not provided by the [place]:    [place] will be made to provide these services after discharge if needed.  Arrangements include referral to agencies that provide these services.  Your insurance has been verified to be:  Medicare Your primary doctor is:  Chase Picket, MD  Pertinent information  will be shared with your doctor and your insurance company.  Inpatient Rehabilitation Care Coordinator:  Erlene Quan, [place] or [phone]  Information discussed with and copy given to patient by: Dyanne Iha, 08/08/2019, 9:11 AM

## 2019-08-08 NOTE — Progress Notes (Signed)
[place] PHYSICAL MEDICINE & REHABILITATION PROGRESS NOTE  Subjective/Complaints: Patient seen laying in bed this AM.  She states she slept well overnight.  She states she is ready for therapies to begin.   ROS: Denies CP, SOB, N/V/D  Objective: Vital Signs: Blood pressure (!) 163/72, pulse 66, temperature 98.7 F (37.1 C), resp. rate 18, height 5\' 3"  (1.6 m), weight 71.9 kg, SpO2 94 %. DG Abd 1 View  Result Date: 08/07/2019 CLINICAL DATA:  Constipation EXAM: ABDOMEN - 1 VIEW COMPARISON:  None. FINDINGS: Nonobstructed gas pattern with moderate stool in the colon. Phleboliths in the pelvis. No radiopaque calculi over the kidneys. IMPRESSION: Negative.  Moderate stool in the colon Electronically Signed   By: Donavan Foil M.D.   On: 08/07/2019 19:45   Recent Labs    08/07/19 0551 08/08/19 0516  WBC 5.3 5.5  HGB 11.4* 11.6*  HCT 35.5* 36.0  PLT 228 205   Recent Labs    08/07/19 0551 08/08/19 0516  NA 140 141  K 3.7 4.0  CL 107 106  CO2 25 24  GLUCOSE 92 96  BUN 7* 7*  CREATININE 1.19* 1.15*  CALCIUM 9.1 9.4    Physical Exam: BP (!) 163/72 (BP Location: Left Arm)   Pulse 66   Temp 98.7 F (37.1 C)   Resp 18   Ht 5\' 3"  (1.6 m)   Wt 71.9 kg   SpO2 94%   BMI 28.08 kg/m  Constitutional: No distress . Vital signs reviewed. HENT: Normocephalic.  Atraumatic. Eyes: EOMI. No discharge. Cardiovascular: No JVD. Respiratory: Normal effort.  No stridor. GI: Non-distended. Skin: Warm and dry.  Intact. Psych: Normal mood.  Normal behavior. Musc: No edema in extremities.  No tenderness in extremities. Neuro: Alert Motor: 5/5 throughout  Assessment/Plan: 1. Functional deficits secondary to bilateral punctate infarcts which require 3+ hours per day of interdisciplinary therapy in a comprehensive inpatient rehab setting.  Physiatrist is providing close team supervision and 24 hour management of active medical problems listed below.  Physiatrist and rehab team continue to  assess barriers to discharge/monitor patient progress toward functional and medical goals  Care Tool:  Bathing              Bathing assist       Upper Body Dressing/Undressing Upper body dressing        Upper body assist      Lower Body Dressing/Undressing Lower body dressing            Lower body assist       Toileting Toileting    Toileting assist Assist for toileting: Independent with assistive device Assistive Device Comment: walker   Transfers Chair/bed transfer  Transfers assist           Locomotion Ambulation   Ambulation assist              Walk 10 feet activity   Assist           Walk 50 feet activity   Assist           Walk 150 feet activity   Assist           Walk 10 feet on uneven surface  activity   Assist           Wheelchair     Assist               Wheelchair 50 feet with 2 turns activity    Assist  Wheelchair 150 feet activity     Assist            Medical Problem List and Plan: 1.  Decreased functional mobility with altered mental status  secondary to right MCA small punctate/scattered infarcts with punctate left MCA ACA and right cerebellar infarcts with right M2 occlusion status post revascularization as well as recent right cerebellar CVA 07/22/2019 with evaluation at [facility] evaluations  Team conference today to discuss current and goals and coordination of care, home and environmental barriers, and discharge planning with nursing, case manager, and therapies.   Therapy notes reviewed - MinA/Min guard 278ft with ambulation, anticipate short LOS 2.  Antithrombotics: -DVT/anticoagulation: Eliquis             -antiplatelet therapy: N/A 3. Pain Management: Tylenol as needed.  4. Mood: Provide emotional support             -antipsychotic agents: N/A 5. Neuropsych: This patient is capable of making decisions on her own behalf. 6. Skin/Wound  Care: Routine skin checks 7. Fluids/Electrolytes/Nutrition: Routine in and outs 8.  Hypertension.  Lasix 20 mg daily, Lopressor 25 mg twice daily, verapamil 160 mg twice daily.    Monitor with increased mobility 9.  PAF.  Cardiac rate controlled.  Continue beta-blocker 10.  CKD stage IIIb.  Creatinine baseline 1.2 on admission.    Cr 1.15 on 6/23 11. Constipation:   KUB personally reviewed - constipation  Bowel meds increased 12. Moderate Hypoalbuminemia  Supplement initiated on 6/23 13. ABLA   Hb 11.6 on 6/23  Cont to monitor  LOS: 1 days A FACE TO FACE EVALUATION WAS PERFORMED  Chauncy Mangiaracina Lorie Phenix 08/08/2019, 8:52 AM

## 2019-08-08 NOTE — Plan of Care (Signed)
  Problem: RH COGNITION-NURSING Goal: RH STG ANTICIPATES NEEDS/CALLS FOR ASSIST W/ASSIST/CUES Description: STG Anticipates Needs/Calls for Assist With Mod i Outcome: Progressing   Problem: RH SAFETY Goal: RH STG ADHERE TO SAFETY PRECAUTIONS W/ASSISTANCE/DEVICE Description: STG Adhere to Safety Precautions With Supervision Outcome: Progressing

## 2019-08-08 NOTE — Progress Notes (Signed)
Inpatient Rehabilitation  Patient information reviewed and entered into eRehab system by Brianni Manthe M. Myana Schlup, M.A., CCC/SLP, PPS Coordinator.  Information including medical coding, functional ability and quality indicators will be reviewed and updated through discharge.    

## 2019-08-08 NOTE — Progress Notes (Signed)
Occupational Therapy Session Note  Patient Details  Name: Jodi [name] MRN: [id number] Date of Birth: 11/04/1932  Today's Date: 08/08/2019 OT Individual Time: 1400-1500 OT Individual Time Calculation (min): 60 min    Short Term Goals: Week 1:  OT Short Term Goal 1 (Week 1): STGs = LTGs  Skilled Therapeutic Interventions/Progress Updates:  Pt received sitting up in recliner. PT approached OT prior to session in re to potential visual deficits. Pt completed line bisection test to assess any L visual neglect- none observed in any of 2 tests completed. Complete visual assessment done with no deficits other than baseline L cataracts causing visual acuity deficits past 12 in. Pt completed dynavision with no significant difference in the R and L quadrants. Pt able to maintain standing balance with LUE reaching overhead with (S). Pt completed functional mobility throughout session with RW with (S). Pt then completed gross motor activity with focus on blocked practice sit <> stands and functional reach. Pt completed [facility] activity bimanually, with great coordination overall. Pt returned to her room and was left sitting up with all needs met. Chair alarm set.   9 hole peg test: R: 33.02, L: 34.02   Therapy Documentation Precautions:  Precautions Precautions: Fall Restrictions Weight Bearing Restrictions: No   Therapy/Group: Individual Therapy  Curtis Sites 08/08/2019, 2:38 PM

## 2019-08-09 ENCOUNTER — Inpatient Hospital Stay (HOSPITAL_COMMUNITY): Payer: MEDICARE | Admitting: Occupational Therapy

## 2019-08-09 ENCOUNTER — Inpatient Hospital Stay (HOSPITAL_COMMUNITY): Payer: MEDICARE

## 2019-08-09 DIAGNOSIS — R0989 Other specified symptoms and signs involving the circulatory and respiratory systems: Secondary | ICD-10-CM

## 2019-08-09 DIAGNOSIS — I10 Essential (primary) hypertension: Secondary | ICD-10-CM

## 2019-08-09 MED ORDER — ATORVASTATIN CALCIUM 40 MG PO TABS: 40.0000 mg | ORAL_TABLET | Freq: Every day | ORAL | 0 refills | Status: AC

## 2019-08-09 MED ORDER — APIXABAN 5 MG PO TABS: 5.0000 mg | ORAL_TABLET | Freq: Two times a day (BID) | ORAL | 1 refills | Status: AC

## 2019-08-09 MED ORDER — VERAPAMIL HCL 80 MG PO TABS: 160.0000 mg | ORAL_TABLET | Freq: Two times a day (BID) | ORAL | 0 refills | Status: DC

## 2019-08-09 MED ORDER — ALBUTEROL SULFATE HFA 108 (90 BASE) MCG/ACT IN AERS: 2.0000 | INHALATION_SPRAY | RESPIRATORY_TRACT | 1 refills | Status: AC | PRN

## 2019-08-09 MED ORDER — METOPROLOL TARTRATE 25 MG PO TABS: 25.0000 mg | ORAL_TABLET | Freq: Two times a day (BID) | ORAL | 1 refills | Status: DC

## 2019-08-09 MED ORDER — BLOOD PRESSURE CONTROL BOOK
Freq: Once | Status: AC
  Filled 2019-08-09: qty 1

## 2019-08-09 MED ORDER — FUROSEMIDE 20 MG PO TABS: 20.0000 mg | ORAL_TABLET | Freq: Every day | ORAL | 1 refills | Status: AC

## 2019-08-09 MED ORDER — VITAMIN D 50 MCG (2000 UT) PO CAPS: 1.0000 | ORAL_CAPSULE | Freq: Every day | ORAL | 1 refills | Status: AC

## 2019-08-09 MED ORDER — ACETAMINOPHEN 325 MG PO TABS: 650.0000 mg | ORAL_TABLET | ORAL | Status: AC | PRN

## 2019-08-09 MED ORDER — SENNOSIDES-DOCUSATE SODIUM 8.6-50 MG PO TABS
1.0000 | ORAL_TABLET | Freq: Two times a day (BID) | ORAL | Status: DC
  Administered 2019-08-09 – 2019-08-10 (×3): 1 via ORAL
  Filled 2019-08-09 (×3): qty 1

## 2019-08-09 NOTE — Progress Notes (Signed)
Occupational Therapy Session Note  Patient Details  Name: Jodi [name] MRN: [id number] Date of Birth: 1932/10/07  Today's Date: 08/09/2019 OT Individual Time: 1120-1200 OT Individual Time Calculation (min): 40 min    Short Term Goals: Week 1:  OT Short Term Goal 1 (Week 1): STGs = LTGs  Skilled Therapeutic Interventions/Progress Updates:    Pt seen this session to address toileting (mod I) and light cooking of making scrambled eggs on range (S) and light house keeping of placing a fitted sheet on a full bed and light cleaning (S).  Pt did well with all tasks only needing short seated rest breaks. Pt stated she was tired from therapies.  Provided pt with a walker bag.  Pt returned to room and resting in recliner with alarm on and all needs met.   Therapy Documentation Precautions:  Precautions Precautions: Fall Restrictions Weight Bearing Restrictions: No  Pain: Pain Assessment Pain Scale: 0-10 Pain Score: 0-No pain   Therapy/Group: Individual Therapy  [place] 08/09/2019, 1:20 PM

## 2019-08-09 NOTE — Progress Notes (Signed)
Inpatient Rehabilitation Care Coordinator  Discharge Note  The overall goal for the admission was met for:   Discharge location: Yes, Home  Length of Stay: Yes, 3 Days  Discharge activity level: Yes  Home/community participation: Yes  Services provided included: MD, RD, PT, OT, SLP, RN, CM, TR, Pharmacy and [place]: Medicare  Follow-up services arranged: Home Health: [facility]  Comments (or additional information):  Patient/Family verbalized understanding of follow-up arrangements: Yes  Individual responsible for coordination of the follow-up plan: Margarito Courser [phone]  Confirmed correct DME delivered: Dyanne Iha 08/09/2019    Dyanne Iha

## 2019-08-09 NOTE — Plan of Care (Signed)
  Problem: RH COGNITION-NURSING Goal: RH STG ANTICIPATES NEEDS/CALLS FOR ASSIST W/ASSIST/CUES Description: STG Anticipates Needs/Calls for Assist With Mod i Outcome: Progressing

## 2019-08-09 NOTE — Progress Notes (Signed)
Occupational Therapy Discharge Summary  Patient Details  Name: Jodi [name] MRN: [id number] Date of Birth: 1933/02/12  Today's Date: 08/09/2019 OT Individual Time: 1607-3710 OT Individual Time Calculation (min): 75 min    Patient has met 10 of 12 long term goals due to ability to compensate for deficits and improved awareness.  Patient to discharge at overall Modified Independent level.  Patient's care partner is independent to provide the necessary cognitive assistance at discharge.    Reasons goals not met: Pt exhibits mild impairment in dynamic standing balance and mild safety awareness in area of RW mgt requiring supervision intermittently.  Recommendation:  Patient will not require further skilled OT follow up at this time.  Pt is modified independent for BADLs and supervision for light homebased IADLs.  Equipment: Recommended shower bench to pt and pts daughter.  Reasons for discharge: treatment goals met and discharge from hospital  Patient/family agrees with progress made and goals achieved: Yes   Skilled OT interventions:  Pt supine in bed upon OT arrival, no complaints of pain, agreeable to washing up at sink.  OT session focused on self care at sink, functional transfers including toilet and shower bench, caregiver education, light housekeeping, and energy conservation pt education.  Pt exhibited modified independence for all dressing, bathing, and oral hygiene in sitting/standing sinkside.  Pt completed functional transfers to toilet using grab bar with mod I.  Educated pts dtr whom arrived partially through session on recommendations of grab bar installation next to toilet and in tub shower.  Pt completed shower bench transfer after OT provided visual demonstration of proper technique with supervision.  Pt returned to room and completed dynamic standing activity using RW with intermittent VCs and visual demo needed on safe mgt due to pt placing partially to side.  Pt retrieved  5/5 items from various height drawers with supervision.  Pt stripped sheets and made bed with supervision and increased time with VCs needed for safe RW mgt and safe body mechanics.  Educated pt on pacing energy conservation strategy.  Collaborated with MD upon arrival regarding dc planning.  Pts dtr reporting she will be available to assist pt as needed upon DC.  Pt returned to recliner with mod I, call bell in reach, seat alarm on.  OT Discharge Precautions/Restrictions  Precautions Precautions: Fall Restrictions Weight Bearing Restrictions: No General   Vital Signs Therapy Vitals Temp: 98.8 F (37.1 C) Pulse Rate: (!) 55 Resp: 17 BP: (!) 118/53 Patient Position (if appropriate): Sitting Oxygen Therapy SpO2: 100 % O2 Device: Room Air Pain Pain Assessment Pain Scale: 0-10 Pain Score: 0-No pain ADL ADL Eating: Independent Where Assessed-Eating: Chair Grooming: Independent Where Assessed-Grooming: Sitting at sink Upper Body Bathing: Modified independent Where Assessed-Upper Body Bathing: Standing at sink Lower Body Bathing: Modified independent Where Assessed-Lower Body Bathing: Standing at sink Upper Body Dressing: Modified independent (Device) Where Assessed-Upper Body Dressing: Sitting at sink Lower Body Dressing: Modified independent Where Assessed-Lower Body Dressing: Sitting at sink Toileting: Modified independent Where Assessed-Toileting: Glass blower/designer: Diplomatic Services operational officer Method: Counselling psychologist: Energy manager: Modified independent Clinical cytogeneticist Method: Optometrist: Gaffer Baseline Vision/History: Cataracts Patient Visual Report: No change from baseline Vision Assessment?: No apparent visual deficits Additional Comments: Pt complains of blurry vision in left eye with right eye closed; Right eye WNL Perception  Perception: Within Functional  Limits Praxis Praxis: Intact Cognition Overall Cognitive Status: Within Functional Limits for tasks assessed Arousal/Alertness: Awake/alert Orientation Level: Oriented  X4 Attention: Sustained Sustained Attention: Appears intact Memory: Appears intact Awareness: Appears intact Problem Solving: Appears intact Safety/Judgment: Appears intact Sensation Sensation Light Touch: Appears Intact Hot/Cold: Appears Intact Proprioception: Appears Intact Stereognosis: Appears Intact Coordination Gross Motor Movements are Fluid and Coordinated: No Fine Motor Movements are Fluid and Coordinated: Yes Motor  Motor Motor: Within Functional Limits Motor - Skilled Clinical Observations: pt complains of generalized weakness in her LEs and a general feeling of unsteadiness Mobility  Transfers Sit to Stand: Independent with assistive device Stand to Sit: Independent with assistive device  Trunk/Postural Assessment  Cervical Assessment Cervical Assessment: Within Functional Limits Thoracic Assessment Thoracic Assessment: Within Functional Limits Lumbar Assessment Lumbar Assessment: Within Functional Limits Postural Control Postural Control: Within Functional Limits  Balance Balance Balance Assessed: Yes Dynamic Sitting Balance Dynamic Sitting - Level of Assistance: 7: Independent Static Standing Balance Static Standing - Level of Assistance: 6: Modified independent (Device/Increase time) Dynamic Standing Balance Dynamic Standing - Level of Assistance: 5: Stand by assistance Extremity/Trunk Assessment RUE Assessment RUE Assessment: Within Functional Limits LUE Assessment LUE Assessment: Within Functional Limits   Ezekiel Slocumb 08/09/2019, 5:17 PM

## 2019-08-09 NOTE — Discharge Summary (Addendum)
Physician Discharge Summary  Patient ID: Jodi [name] MRN: [id number] DOB/AGE: 1932-02-24 86 y.o.  Admit date: 08/07/2019 Discharge date: 08/10/2019  Discharge Diagnoses:  Principal Problem:   Right middle cerebral artery stroke [facility]) Active Problems:   Acute blood loss anemia   Constipation   Hypoalbuminemia due to protein-calorie malnutrition (HCC)   Benign essential HTN   Labile blood pressure CKD stage III PAF Hypertension  Discharged Condition: Stable  Significant Diagnostic Studies: CT Angio Head W or Wo Contrast  Result Date: 08/02/2019 CLINICAL DATA:  Confusion.  Abnormal head CT. EXAM: CT ANGIOGRAPHY HEAD AND NECK TECHNIQUE: Multidetector CT imaging of the head and neck was performed using the standard protocol during bolus administration of intravenous contrast. Multiplanar CT image reconstructions and MIPs were obtained to evaluate the vascular anatomy. Carotid stenosis measurements (when applicable) are obtained utilizing NASCET criteria, using the distal internal carotid diameter as the denominator. CONTRAST:  137m OMNIPAQUE IOHEXOL 350 MG/ML SOLN COMPARISON:  Head CT earlier same day FINDINGS: CTA NECK FINDINGS Aortic arch: Aortic atherosclerosis. No aneurysm or dissection. Branching pattern is normal without origin stenosis. Right carotid system: Common carotid artery widely patent to the bifurcation. Carotid bifurcation is normal without atherosclerotic disease or stenosis. Cervical ICA is widely patent. Left carotid system: Common carotid artery widely patent to the bifurcation. Carotid bifurcation is normal. Cervical ICA is normal. Vertebral arteries: Both vertebral artery origins are widely patent. The left is dominant. Both vertebral arteries are patent through the cervical region to the foramen magnum. Skeleton: Ordinary cervical spondylosis. Other neck: No mass or lymphadenopathy. Upper chest: Normal Review of the MIP images confirms the above findings CTA HEAD FINDINGS  Anterior circulation: Both internal carotid arteries widely patent through the skull base and siphon regions. The anterior and middle cerebral vessels are patent proximally. There is occlusion of a proximal right M2 branch vessel. No other anterior circulation finding. No aneurysm or vascular malformation. Posterior circulation: Both vertebral arteries are patent through the foramen magnum to the basilar. Patent posterior communicating arteries on each side. No basilar stenosis. Superior cerebellar and posterior cerebral arteries show flow. Primary anterior circulation supply of the posterior cerebral arteries. Venous sinuses: Patent and normal. Anatomic variants: None significant. Review of the MIP images confirms the above findings IMPRESSION: Occlusion of a right proximal M2 branch. No other abnormal anterior circulation finding. Posterior circulation intracranial branch vessels are intact. No carotid bifurcation disease.  No vertebral artery origin disease. Minimal aortic atherosclerosis. These results were called by telephone at the time of interpretation on 08/02/2019 at 6:24 pm to Dr. YMarcy Salvoverbally acknowledged these results. Electronically Signed   By: MNelson ChimesM.D.   On: 08/02/2019 18:28   DG Abd 1 View  Result Date: 08/07/2019 CLINICAL DATA:  Constipation EXAM: ABDOMEN - 1 VIEW COMPARISON:  None. FINDINGS: Nonobstructed gas pattern with moderate stool in the colon. Phleboliths in the pelvis. No radiopaque calculi over the kidneys. IMPRESSION: Negative.  Moderate stool in the colon Electronically Signed   By: KDonavan FoilM.D.   On: 08/07/2019 19:45   CT Angio Neck W and/or Wo Contrast  Result Date: 08/02/2019 CLINICAL DATA:  Confusion.  Abnormal head CT. EXAM: CT ANGIOGRAPHY HEAD AND NECK TECHNIQUE: Multidetector CT imaging of the head and neck was performed using the standard protocol during bolus administration of intravenous contrast. Multiplanar CT image reconstructions and MIPs were  obtained to evaluate the vascular anatomy. Carotid stenosis measurements (when applicable) are obtained utilizing NASCET criteria, using the distal internal carotid  diameter as the denominator. CONTRAST:  138m OMNIPAQUE IOHEXOL 350 MG/ML SOLN COMPARISON:  Head CT earlier same day FINDINGS: CTA NECK FINDINGS Aortic arch: Aortic atherosclerosis. No aneurysm or dissection. Branching pattern is normal without origin stenosis. Right carotid system: Common carotid artery widely patent to the bifurcation. Carotid bifurcation is normal without atherosclerotic disease or stenosis. Cervical ICA is widely patent. Left carotid system: Common carotid artery widely patent to the bifurcation. Carotid bifurcation is normal. Cervical ICA is normal. Vertebral arteries: Both vertebral artery origins are widely patent. The left is dominant. Both vertebral arteries are patent through the cervical region to the foramen magnum. Skeleton: Ordinary cervical spondylosis. Other neck: No mass or lymphadenopathy. Upper chest: Normal Review of the MIP images confirms the above findings CTA HEAD FINDINGS Anterior circulation: Both internal carotid arteries widely patent through the skull base and siphon regions. The anterior and middle cerebral vessels are patent proximally. There is occlusion of a proximal right M2 branch vessel. No other anterior circulation finding. No aneurysm or vascular malformation. Posterior circulation: Both vertebral arteries are patent through the foramen magnum to the basilar. Patent posterior communicating arteries on each side. No basilar stenosis. Superior cerebellar and posterior cerebral arteries show flow. Primary anterior circulation supply of the posterior cerebral arteries. Venous sinuses: Patent and normal. Anatomic variants: None significant. Review of the MIP images confirms the above findings IMPRESSION: Occlusion of a right proximal M2 branch. No other abnormal anterior circulation finding. Posterior  circulation intracranial branch vessels are intact. No carotid bifurcation disease.  No vertebral artery origin disease. Minimal aortic atherosclerosis. These results were called by telephone at the time of interpretation on 08/02/2019 at 6:24 pm to Dr. YMarcy Salvoverbally acknowledged these results. Electronically Signed   By: MNelson ChimesM.D.   On: 08/02/2019 18:28   MR ANGIO HEAD WO CONTRAST  Result Date: 08/03/2019 CLINICAL DATA:  86year old female code stroke presentation yesterday with right MCA M2 ELVO status post endovascular recanalization. History of atrial fibrillation. EXAM: MRI HEAD WITHOUT CONTRAST MRA HEAD WITHOUT CONTRAST TECHNIQUE: Multiplanar, multiecho pulse sequences of the brain and surrounding structures were obtained without intravenous contrast. Angiographic images of the head were obtained using MRA technique without contrast. COMPARISON:  CT head, CTA head and neck yesterday. FINDINGS: MRI HEAD FINDINGS Brain: Linear restricted diffusion from the posterior right corona radiata to the posterior right lentiform (series 7, image 54). Superimposed scattered, generally punctate foci of cortical and white matter restricted diffusion in the right hemisphere, including in the right occipital lobe (series 5, image 74). Furthermore, there are also occasional punctate foci of restricted diffusion in the left hemisphere (series 5, image 87) and also the right cerebellum. No hemorrhage or mass effect associated with the acute ischemia. 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 Minimal chronic microhemorrhage in the hemispheres. Moderate patchy bilateral cerebral white matter T2 and FLAIR hyperintensity. No restricted midline shift, mass effect, evidence of mass lesion, ventriculomegaly, extra-axial collection. Cervicomedullary junction and pituitary are within normal limits. Vascular: Major intracranial vascular flow voids are preserved. See MRA findings below. Skull and  upper cervical spine: Negative visible cervical spine, bone marrow signal. Sinuses/Orbits: Negative orbits. Paranasal sinuses remain well pneumatized. Other: Mild right mastoid effusion. Negative visible nasopharynx. Scalp and face soft tissues appear negative. MRA HEAD FINDINGS Antegrade flow in the posterior circulation with dominant left vertebral artery again noted. Patent PICA origins. No distal vertebral stenosis. Patent vertebrobasilar junction. Patent basilar artery without stenosis. Patent SCA origins. Fetal bilateral PCA origins.  Bilateral PCA branches are within normal limits. Antegrade flow in both ICA siphons. No siphon stenosis. Normal ophthalmic and posterior communicating artery origins. Patent carotid termini, MCA and ACA origins. Tortuous right A1. Visible ACA branches are stable and within normal limits. Left MCA M1 and bifurcation remain patent without stenosis. Visible left MCA branches are stable and within normal limits. Right MCA M1 and bifurcation are patent without stenosis. The recently abnormal posterior right M2 branch remains recanalized (series 9, image 114 and series 1078 image 14). Visible right MCA branches now are within normal limits. IMPRESSION: 1. Positive for relatively widely scattered but generally punctate infarcts in the right hemisphere, the largest tracks from the posterior right corona radiata to the lentiform. Superimposed occasional punctate infarcts also in the left hemisphere and cerebellum. This pattern suggests recent embolic event in the setting of atrial fibrillation. No associated acute hemorrhage or mass effect. 2. Intracranial MRA is negative today, with normalized Right MCA M2 branches. Also note bilateral fetal PCA origins. 3. 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 Electronically Signed   By: Genevie Ann M.D.   On: 08/03/2019 11:36   MR BRAIN WO CONTRAST  Result Date: 08/03/2019 CLINICAL DATA:  86 year old female code stroke presentation  yesterday with right MCA M2 ELVO status post endovascular recanalization. History of atrial fibrillation. EXAM: MRI HEAD WITHOUT CONTRAST MRA HEAD WITHOUT CONTRAST TECHNIQUE: Multiplanar, multiecho pulse sequences of the brain and surrounding structures were obtained without intravenous contrast. Angiographic images of the head were obtained using MRA technique without contrast. COMPARISON:  CT head, CTA head and neck yesterday. FINDINGS: MRI HEAD FINDINGS Brain: Linear restricted diffusion from the posterior right corona radiata to the posterior right lentiform (series 7, image 54). Superimposed scattered, generally punctate foci of cortical and white matter restricted diffusion in the right hemisphere, including in the right occipital lobe (series 5, image 74). Furthermore, there are also occasional punctate foci of restricted diffusion in the left hemisphere (series 5, image 87) and also the right cerebellum. No hemorrhage or mass effect associated with the acute ischemia. 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 Minimal chronic microhemorrhage in the hemispheres. Moderate patchy bilateral cerebral white matter T2 and FLAIR hyperintensity. No restricted midline shift, mass effect, evidence of mass lesion, ventriculomegaly, extra-axial collection. Cervicomedullary junction and pituitary are within normal limits. Vascular: Major intracranial vascular flow voids are preserved. See MRA findings below. Skull and upper cervical spine: Negative visible cervical spine, bone marrow signal. Sinuses/Orbits: Negative orbits. Paranasal sinuses remain well pneumatized. Other: Mild right mastoid effusion. Negative visible nasopharynx. Scalp and face soft tissues appear negative. MRA HEAD FINDINGS Antegrade flow in the posterior circulation with dominant left vertebral artery again noted. Patent PICA origins. No distal vertebral stenosis. Patent vertebrobasilar junction. Patent basilar artery without  stenosis. Patent SCA origins. Fetal bilateral PCA origins. Bilateral PCA branches are within normal limits. Antegrade flow in both ICA siphons. No siphon stenosis. Normal ophthalmic and posterior communicating artery origins. Patent carotid termini, MCA and ACA origins. Tortuous right A1. Visible ACA branches are stable and within normal limits. Left MCA M1 and bifurcation remain patent without stenosis. Visible left MCA branches are stable and within normal limits. Right MCA M1 and bifurcation are patent without stenosis. The recently abnormal posterior right M2 branch remains recanalized (series 9, image 114 and series 1078 image 14). Visible right MCA branches now are within normal limits. IMPRESSION: 1. Positive for relatively widely scattered but generally punctate infarcts in the right hemisphere, the largest  tracks from the posterior right corona radiata to the lentiform. Superimposed occasional punctate infarcts also in the left hemisphere and cerebellum. This pattern suggests recent embolic event in the setting of atrial fibrillation. No associated acute hemorrhage or mass effect. 2. Intracranial MRA is negative today, with normalized Right MCA M2 branches. Also note bilateral fetal PCA origins. 3. 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 Electronically Signed   By: Genevie Ann M.D.   On: 08/03/2019 11:36   IR CT Head Ltd  Result Date: 08/03/2019 INDICATION: 86 year old female with past medical history significant for recent cerebellar stroke, atrial fibrillation, hypertension and GI cancer. She presented to an outside hospital with confusion and aphasia, NIHSS 5. Head CT showed no acute right MCA territory infarct with known left cerebellar stroke. CT angiogram of the head and neck showed a right M2/MCA occlusion. She was taken to our service for an emergency diagnostic cerebral angiogram and mechanical thrombectomy. EXAM: Diagnostic cerebral angiogram and mechanical thrombectomy. COMPARISON:   CT/CT angiogram of the head and neck August 02, 2019 MEDICATIONS: Ancef 2 gm IV. The antibiotic was administered within 1 hour of the procedure ANESTHESIA/SEDATION: The procedure was performed in the general anesthesia. FLUOROSCOPY TIME:  Fluoroscopy Time: 11 minutes (360.9 mGy). COMPLICATIONS: None immediate. TECHNIQUE: Informed written consent was obtained from the patient's daughter after a thorough discussion of the procedural risks, benefits and alternatives. All questions were addressed. Maximal Sterile Barrier Technique was utilized including caps, mask, sterile gowns, sterile gloves, sterile drape, hand hygiene and skin antiseptic. A timeout was performed prior to the initiation of the procedure. Real-time ultrasound guidance was utilized for vascular access including the acquisition of a permanent ultrasound image documenting patency of the accessed vessel. Using the modified Seldinger technique and a micropuncture kit, access was gained to the right radial artery at the wrist and a 7 French sheath was placed. Slow intra arterial infusion of 300 mcg nitroglicerin diluted in patient's own blood was performed. No significant fluctuation in patient's blood pressure seen. Then, a right radial artery roadmap was obtained via sheath side port. Normal brachial artery branching pattern seen. No significant anatomical variation. The right radial artery caliber is adequate for vascular access. Then, a 6 [place] Berenstein 2 catheter was navigated over a 0.035 inch a Terumo Glidewire into the right subclavian artery and then placed into the right common carotid artery. The wire was removed and a roadmap of the head and neck was obtained. Then, the catheter was exchanged over the wire for an infinity long sheath which was placed the cervical segment of the right ICA. Frontal and lateral angiograms of the head were obtained. FINDINGS: A subocclusive clot is seen within the proximal right M2/MCA posterior division branch  with an occlusive clot seen in in the mid M2 segment extending to the M3. PROCEDURE: Under biplane roadmap, coaxial navigation of a Zoom 55 aspiration catheter over a synchro support microguidewire into the right M2/MCA posterior division branch. The wire was removed and aspiration catheter was connected to an aspiration pump. Continuous aspiration was performed for 4 minutes. The aspiration catheter was then retracted under continuous aspiration. The guiding catheter was aspirated and brisk flow was noted. Right ICA angiogram was obtained with magnified frontal and lateral view of the head showing complete recanalization of the right M2/MCA (TICI3). Frontal and lateral angiograms with views of the entire head showed no evidence of thromboembolic complication. Flat panel CT of the head was obtained and post processed in a separate workstation with  concurrent attending physician supervision. Selected images were sent to PACS. No hemorrhagic complication noted. Hyperdensity seen in the medial aspect of the left inferior cerebellar hemisphere, in the location of known stroke, likely representing contrast staining. The catheter was subsequently withdrawn. IMPRESSION: 1. Successful mechanical thrombectomy for treatment of a right M2/MCA posterior division branch occlusion via right radial approach. A total of 1 pass with direct contact aspiration with complete recanalization (TICI3). 2. No embolus to new or distal territory. 3. No hemorrhagic complication. PLAN: 1. ICU level of care. 2. SBP 120-140 mmHg. 3. Follow-up imaging within 24 hours. Electronically Signed   By: Pedro Earls M.D.   On: 08/03/2019 11:16   ECHOCARDIOGRAM COMPLETE  Result Date: 08/03/2019    ECHOCARDIOGRAM REPORT   Patient Name:   RENEE ERB Date of Exam: 08/03/2019 Medical Rec #:  [id number]      Height:       63.0 in Accession #:    [id number]     Weight:       161.2 lb Date of Birth:  November 25, 1932       BSA:          1.764 m  Patient Age:    79 years       BP:           144/59 mmHg Patient Gender: F              HR:           77 bpm. Exam Location:  Inpatient Procedure: 2D Echo, 3D Echo, Color Doppler and Cardiac Doppler Indications:    Stroke i163.9  History:        Patient has no prior history of Echocardiogram examinations.                 Arrythmias:Atrial Fibrillation; Risk Factors:Hypertension and                 Dyslipidemia. Prior performed at [facility].  Sonographer:    Raquel Sarna Senior RDCS Referring Phys: [place]  1. Left ventricular ejection fraction, by estimation, is 60 to 65%. The left ventricle has normal function. The left ventricle has no regional wall motion abnormalities. There is mild concentric left ventricular hypertrophy. Left ventricular diastolic parameters are consistent with Grade II diastolic dysfunction (pseudonormalization). Elevated left atrial pressure.  2. Right ventricular systolic function is normal. The right ventricular size is normal. There is mildly elevated pulmonary artery systolic pressure. The estimated right ventricular systolic pressure is 03.5 mmHg.  3. Left atrial size was mildly dilated.  4. The mitral valve is normal in structure. No evidence of mitral valve regurgitation. No evidence of mitral stenosis.  5. The aortic valve is normal in structure. Aortic valve regurgitation is not visualized. No aortic stenosis is present.  6. The inferior vena cava is normal in size with greater than 50% respiratory variability, suggesting right atrial pressure of 3 mmHg. Comparison(s): Prior images unable to be directly viewed, comparison made by report only. Prior echo at [facility] 07/25/2019 showed similar findings. FINDINGS  Left Ventricle: Left ventricular ejection fraction, by estimation, is 60 to 65%. The left ventricle has normal function. The left ventricle has no regional wall motion abnormalities. The left ventricular internal cavity size was normal in size. There is  mild concentric  left ventricular hypertrophy. Left ventricular diastolic parameters are consistent with Grade II diastolic dysfunction (pseudonormalization). Elevated left atrial pressure. Right Ventricle: The right ventricular size is normal. No increase in  right ventricular wall thickness. Right ventricular systolic function is normal. There is mildly elevated pulmonary artery systolic pressure. The tricuspid regurgitant velocity is 2.95  m/s, and with an assumed right atrial pressure of 3 mmHg, the estimated right ventricular systolic pressure is 10.9 mmHg. Left Atrium: Left atrial size was mildly dilated. Right Atrium: Right atrial size was normal in size. Pericardium: There is no evidence of pericardial effusion. Mitral Valve: The mitral valve is normal in structure. Normal mobility of the mitral valve leaflets. No evidence of mitral valve regurgitation. No evidence of mitral valve stenosis. MV peak gradient, 9.1 mmHg. The mean mitral valve gradient is 3.0 mmHg. Tricuspid Valve: The tricuspid valve is normal in structure. Tricuspid valve regurgitation is trivial. No evidence of tricuspid stenosis. Aortic Valve: The aortic valve is normal in structure. Aortic valve regurgitation is not visualized. No aortic stenosis is present. Pulmonic Valve: The pulmonic valve was normal in structure. Pulmonic valve regurgitation is not visualized. No evidence of pulmonic stenosis. Aorta: The aortic root is normal in size and structure. Venous: The inferior vena cava is normal in size with greater than 50% respiratory variability, suggesting right atrial pressure of 3 mmHg. IAS/Shunts: No atrial level shunt detected by color flow Doppler.  LEFT VENTRICLE PLAX 2D LVIDd:         3.60 cm  Diastology LVIDs:         2.20 cm  LV e' lateral:   7.72 cm/s LV PW:         1.30 cm  LV E/e' lateral: 16.7 LV IVS:        1.40 cm  LV e' medial:    6.74 cm/s LVOT diam:     2.00 cm  LV E/e' medial:  19.1 LV SV:         90 LV SV Index:   51 LVOT Area:     3.14  cm  RIGHT VENTRICLE RV S prime:     11.60 cm/s TAPSE (M-mode): 2.0 cm LEFT ATRIUM             Index       RIGHT ATRIUM           Index LA diam:        3.90 cm 2.21 cm/m  RA Area:     13.20 cm LA Vol (A2C):   63.9 ml 36.22 ml/m RA Volume:   24.80 ml  14.06 ml/m LA Vol (A4C):   51.5 ml 29.19 ml/m LA Biplane Vol: 59.8 ml 33.90 ml/m  AORTIC VALVE LVOT Vmax:   135.00 cm/s LVOT Vmean:  93.300 cm/s LVOT VTI:    0.288 m  AORTA Ao Root diam: 3.00 cm Ao Asc diam:  3.20 cm MITRAL VALVE                TRICUSPID VALVE MV Area (PHT): 3.37 cm     TR Peak grad:   34.8 mmHg MV Peak grad:  9.1 mmHg     TR Vmax:        295.00 cm/s MV Mean grad:  3.0 mmHg MV Vmax:       1.51 m/s     SHUNTS MV Vmean:      77.4 cm/s    Systemic VTI:  0.29 m MV Decel Time: 225 msec     Systemic Diam: 2.00 cm MV E velocity: 129.00 cm/s MV A velocity: 42.00 cm/s MV E/A ratio:  3.07 Mihai Croitoru MD Electronically signed by Sanda Klein MD Signature Date/Time: 08/03/2019/12:16:18  PM    Final    IR PERCUTANEOUS ART THROMBECTOMY/INFUSION INTRACRANIAL INC DIAG ANGIO  Result Date: 08/03/2019 INDICATION: 86 year old female with past medical history significant for recent cerebellar stroke, atrial fibrillation, hypertension and GI cancer. She presented to an outside hospital with confusion and aphasia, NIHSS 5. Head CT showed no acute right MCA territory infarct with known left cerebellar stroke. CT angiogram of the head and neck showed a right M2/MCA occlusion. She was taken to our service for an emergency diagnostic cerebral angiogram and mechanical thrombectomy. EXAM: Diagnostic cerebral angiogram and mechanical thrombectomy. COMPARISON:  CT/CT angiogram of the head and neck August 02, 2019 MEDICATIONS: Ancef 2 gm IV. The antibiotic was administered within 1 hour of the procedure ANESTHESIA/SEDATION: The procedure was performed in the general anesthesia. FLUOROSCOPY TIME:  Fluoroscopy Time: 11 minutes (360.9 mGy). COMPLICATIONS: None immediate.  TECHNIQUE: Informed written consent was obtained from the patient's daughter after a thorough discussion of the procedural risks, benefits and alternatives. All questions were addressed. Maximal Sterile Barrier Technique was utilized including caps, mask, sterile gowns, sterile gloves, sterile drape, hand hygiene and skin antiseptic. A timeout was performed prior to the initiation of the procedure. Real-time ultrasound guidance was utilized for vascular access including the acquisition of a permanent ultrasound image documenting patency of the accessed vessel. Using the modified Seldinger technique and a micropuncture kit, access was gained to the right radial artery at the wrist and a 7 French sheath was placed. Slow intra arterial infusion of 240 mcg nitroglicerin diluted in patient's own blood was performed. No significant fluctuation in patient's blood pressure seen. Then, a right radial artery roadmap was obtained via sheath side port. Normal brachial artery branching pattern seen. No significant anatomical variation. The right radial artery caliber is adequate for vascular access. Then, a 6 [place] Berenstein 2 catheter was navigated over a 0.035 inch a Terumo Glidewire into the right subclavian artery and then placed into the right common carotid artery. The wire was removed and a roadmap of the head and neck was obtained. Then, the catheter was exchanged over the wire for an infinity long sheath which was placed the cervical segment of the right ICA. Frontal and lateral angiograms of the head were obtained. FINDINGS: A subocclusive clot is seen within the proximal right M2/MCA posterior division branch with an occlusive clot seen in in the mid M2 segment extending to the M3. PROCEDURE: Under biplane roadmap, coaxial navigation of a Zoom 55 aspiration catheter over a synchro support microguidewire into the right M2/MCA posterior division branch. The wire was removed and aspiration catheter was connected to an  aspiration pump. Continuous aspiration was performed for 4 minutes. The aspiration catheter was then retracted under continuous aspiration. The guiding catheter was aspirated and brisk flow was noted. Right ICA angiogram was obtained with magnified frontal and lateral view of the head showing complete recanalization of the right M2/MCA (TICI3). Frontal and lateral angiograms with views of the entire head showed no evidence of thromboembolic complication. Flat panel CT of the head was obtained and post processed in a separate workstation with concurrent attending physician supervision. Selected images were sent to PACS. No hemorrhagic complication noted. Hyperdensity seen in the medial aspect of the left inferior cerebellar hemisphere, in the location of known stroke, likely representing contrast staining. The catheter was subsequently withdrawn. IMPRESSION: 1. Successful mechanical thrombectomy for treatment of a right M2/MCA posterior division branch occlusion via right radial approach. A total of 1 pass with direct contact aspiration with  complete recanalization (TICI3). 2. No embolus to new or distal territory. 3. No hemorrhagic complication. PLAN: 1. ICU level of care. 2. SBP 120-140 mmHg. 3. Follow-up imaging within 24 hours. Electronically Signed   By: Pedro Earls M.D.   On: 08/03/2019 11:16   CT HEAD CODE STROKE WO CONTRAST  Result Date: 08/02/2019 CLINICAL DATA:  Code stroke. Worsening confusion. Recent stroke treated at outside institution. EXAM: CT HEAD WITHOUT CONTRAST TECHNIQUE: Contiguous axial images were obtained from the base of the skull through the vertex without intravenous contrast. COMPARISON:  None. FINDINGS: Brain: Age related volume loss. Chronic small-vessel ischemic changes of the cerebral hemispheric white matter. Probable low-density in the left cerebellum that could be an acute or subacute infarction. No sign of hemorrhage, mass, hydrocephalus or extra-axial  collection. Vascular: There is atherosclerotic calcification of the major vessels at the base of the brain. Skull: Negative Sinuses/Orbits: Clear/normal Other: None ASPECTS ([place] Stroke Program Early CT Score) - Ganglionic level infarction (caudate, lentiform nuclei, internal capsule, insula, M1-M3 cortex): 7 - Supraganglionic infarction (M4-M6 cortex): 3 Total score (0-10 with 10 being normal): 10 IMPRESSION: 1. Chronic small-vessel ischemic changes of the cerebral hemispheric white matter. Probable acute or subacute infarction in the left cerebellar hemisphere. No mass effect or hemorrhage. 2. These results were called by telephone at the time of interpretation on 08/02/2019 at 4:17 pm to provider Dr. Melina Copa, who verbally acknowledged these results. 3. ASPECTS is Electronically Signed   By: Nelson Chimes M.D.   On: 08/02/2019 16:19    Labs:  Basic Metabolic Panel: Recent Labs  Lab 08/03/19 1031 08/04/19 0602 08/05/19 0810 08/06/19 0449 08/07/19 0551 08/08/19 0516  NA 140 140 139 142 140 141  K 4.0 3.9 3.7 4.0 3.7 4.0  CL 108 108 104 108 107 106  CO2 20* '23 27 27 25 24  ' GLUCOSE 143* 97 86 88 92 96  BUN 7* '11 9 8 ' 7* 7*  CREATININE 1.10* 1.26* 1.12* 1.16* 1.19* 1.15*  CALCIUM 9.4 9.2 9.0 9.1 9.1 9.4    CBC: Recent Labs  Lab 08/06/19 0449 08/07/19 0551 08/08/19 0516  WBC 5.6 5.3 5.5  NEUTROABS  --   --  3.3  HGB 11.6* 11.4* 11.6*  HCT 36.4 35.5* 36.0  MCV 90.3 90.8 90.0  PLT 232 228 205    CBG: No results for input(s): GLUCAP in the last 168 hours. Family history Father with prostate cancer denies any diabetes mellitus colon cancer or rectal cancer  Brief HPI:   Sagal Gayton is a 86 y.o. 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  Per chart review lives alone independent prior to admission daughter assist on discharge.  Presented 08/02/2019 with  altered mental status.  Found to have occlusion of right proximal M2 branch and underwent cerebral angiogram and mechanical thrombectomy with complete revascularization of right M2 MCA posterior division.  Follow-up MRI/MRI of the brain revealed widely scattered punctate infarct in the right hemisphere from posterior corona radiata lentiform and chronic left cerebellar PICA infarction.  Echocardiogram with ejection fraction 65% mild concentric LVH.  Neurology follow-up maintained on Eliquis.  Patient was admitted for a comprehensive rehab program.   Hospital Course: Jacqulynn Shappell was admitted to rehab 08/07/2019 for inpatient therapies to consist of PT, ST and OT at least three hours five days a week. Past admission physiatrist, therapy team and rehab RN have  worked together to provide customized collaborative inpatient rehab.  Pertaining to patient's right MCA small punctate scattered infarct remained stable maintained on Eliquis patient would follow neurology services.  Blood pressure control Lasix Lopressor verapamil would follow-up outpatient services.  Cardiac rate controlled continue on low-dose beta-blocker.  Creatinine stable at 1.20 on admission with latest creatinine 1.15.  Bouts of constipation resolved with laxative assistance.   Blood pressures were monitored on TID basis and controlled     Rehab course: During patient's stay in rehab weekly team conferences were held to monitor patient's progress, set goals and discuss barriers to discharge. At admission, patient required minimal assist ambulate 200 feet minimal assist stand pivot transfers.  Minimal assist upper body bathing moderate assist lower body bathing minimal assist upper body dressing mod assist lower body dressing  Physical exam.  Blood pressure 128/70 pulse 80 temperature 98 respirations 18 oxygen saturations 92% room air Constitutional alert and oriented HEENT Head.  Normocephalic and atraumatic Eyes.  Pupils round and  reactive to light no discharge.nystagmus Neck.  Supple nontender no JVD without thyromegaly Cardiac regular rate rhythm without any extra sounds Abdomen.  Soft nontender positive bowel sounds without rebound Respiratory effort normal no respiratory distress without wheeze Neurological alert and oriented follows commands strength 5/5 throughout sensation intact  He/  has had improvement in activity tolerance, balance, postural control as well as ability to compensate for deficits. He/ has had improvement in functional use RUE/LUE  and RLE/LLE as well as improvement in awareness.  Patient ambulating extended distances supervision.  Independent sit to stand.  Got his belongings for activities delivered and homemaking.  He was able to increase his ambulation up to 506 feet with close supervision.  Full teaching completed plan discharged home       Disposition: Discharge to home    Diet: Regular  Special Instructions: No driving smoking or alcohol  Medications at discharge 1.  Tylenol as needed 2.  Eliquis 5 mg p.o. twice daily 3.  Lipitor 40 mg p.o. nightly 4.  Vitamin D 2000 units daily 5.  Lasix 20 mg p.o. daily 6.  Lopressor 25 mg p.o. twice daily 7.  Verapamil 160 mg p.o. twice daily  30-35 minutes were spent completing discharge summary and discharge planning  Discharge Instructions     Ambulatory referral to Neurology   Complete by: As directed    An appointment is requested in approximately 4 weeks right MCA infarction   Ambulatory referral to Physical Medicine Rehab   Complete by: As directed    Moderate complexity follow-up 1 to 2 weeks right MCA infarction        Follow-up Information     Jamse Arn, MD Follow up.   Specialty: Physical Medicine and Rehabilitation Why: Office to call for appointment Contact information: [address] [place] [address] [phone]                 Signed: Cathlyn Parsons 08/10/2019, 5:10  AM Patient was seen, face-face, and physical exam performed by me on day of discharge, greater than 30 minutes of total time spent.. Please see progress note from day of discharge as well.  Delice Lesch, MD, ABPMR

## 2019-08-09 NOTE — Progress Notes (Signed)
Physical Therapy Discharge Summary  Patient Details  Name: Jodi [name] MRN: [id number] Date of Birth: 1932-04-10  Today's Date: 08/09/2019 PT Individual Time:  -    1000-1100 Treatment time 44mn, see separate treatment note.   Patient has met 0 of 9 long term goals due to pt being dc'd after 2day stay.  Patient to discharge at an ambulatory level CGA.   Patient's care partner was not present during stay for training to provide the necessary physical assistance at discharge.  Reasons goals not met: sudden plan for DC  Recommendation:  Patient will benefit from ongoing skilled PT services in home health setting to continue to advance safe functional mobility, address ongoing impairments in static and dynamic standing balance and w/functional mobility in all environments, and minimize fall risk.  Equipment: No equipment provided  Reasons for discharge: discharge from hospital  Patient/family agrees with progress made and goals achieved: Yes per MD/social worker notes family is in agreement w/dc  PT Discharge Precautions/Restrictions Precautions Precautions: Fall Restrictions Weight Bearing Restrictions: No Vital Signs Therapy Vitals Temp: 98.8 F (37.1 C) Pulse Rate: (!) 55 Resp: 17 BP: (!) 118/53 Patient Position (if appropriate): Sitting Oxygen Therapy SpO2: 100 % O2 Device: Room Air Pain Pain Assessment Pain Scale: 0-10 Pain Score: 0-No pain Vision/Perception  Vision - Assessment Additional Comments: Pt complains of blurry vision in left eye with right eye closed; Right eye WNL Perception Perception: Within Functional Limits Praxis Praxis: Intact  Cognition Overall Cognitive Status: Within Functional Limits for tasks assessed Arousal/Alertness: Awake/alert Orientation Level: Oriented X4 Attention: Sustained Sustained Attention: Appears intact Memory: Appears intact Awareness: Appears intact Problem Solving: Appears intact Safety/Judgment: Appears  intact Sensation Sensation Light Touch: Appears Intact Hot/Cold: Appears Intact Proprioception: Appears Intact Stereognosis: Appears Intact Coordination Gross Motor Movements are Fluid and Coordinated: Not tested (see eval note) Fine Motor Movements are Fluid and Coordinated: Not tested (see eval note) Motor  Motor Motor: Within Functional Limits Motor - Skilled Clinical Observations: pt complains of generalized weakness in her LEs and a general feeling of unsteadiness Motor - Discharge Observations: see evaluation note  Mobility Bed Mobility Bed Mobility: Rolling Right;Rolling Left;Supine to Sit;Sit to Supine Rolling Right: Independent Rolling Left: Independent Supine to Sit: Independent Sit to Supine: Independent Transfers Transfers: Sit to Stand;Stand to Sit;Stand Pivot Transfers Sit to Stand: Supervision/Verbal cueing Stand to Sit: Supervision/Verbal cueing Stand Pivot Transfers: Supervision/Verbal cueing Stand Pivot Transfer Details: Verbal cues for safe use of DME/AE Stand Pivot Transfer Details (indicate cue type and reason): mild decreased clearance RLE Transfer (Assistive device): Rolling walker Locomotion  Gait Ambulation: Yes Gait Assistance: Supervision/Verbal cueing;Contact Guard/Touching assist Gait Distance (Feet): 175 Feet Assistive device: Rolling walker Gait Assistance Details: decreased clearance L>R Gait Gait: Yes Gait Pattern: Decreased stride length;Decreased hip/knee flexion - right;Decreased hip/knee flexion - left;Right foot flat;Left foot flat;Decreased trunk rotation Gait velocity: decreased Stairs / Additional Locomotion Stairs: Yes (see evaluation/last date noe) WProduct managerMobility: No  Trunk/Postural Assessment  Cervical Assessment Cervical Assessment: Within Functional Limits Thoracic Assessment Thoracic Assessment: Within Functional Limits Lumbar Assessment Lumbar Assessment: Within Functional Limits Postural  Control Postural Control: Deficits on evaluation  Balance Balance Balance Assessed: Yes Standardized Balance Assessment Standardized Balance Assessment: Berg Balance Test (see full assessment 6/23) Dynamic Sitting Balance Dynamic Sitting - Level of Assistance: 7: Independent Static Standing Balance Static Standing - Level of Assistance: 6: Modified independent (Device/Increase time) Dynamic Standing Balance Dynamic Standing - Level of Assistance: 5: Stand by assistance Extremity Assessment  RUE  Assessment RUE Assessment: Within Functional Limits General Strength Comments: proximal weakness at shoulders grossly 4-/5, hips 4-/5, L deficits>R LUE Assessment LUE Assessment: Within Functional Limits General Strength Comments: see RUE RLE Assessment General Strength Comments: see RUE LLE Assessment General Strength Comments: see RUE    Jerrilyn Cairo 08/09/2019, 5:29 PM

## 2019-08-09 NOTE — Progress Notes (Signed)
Inpatient Rehabilitation Care Coordinator Assessment and Plan  Patient Details  Name: Jodi [name] MRN: [id number] Date of Birth: 05-Jun-1932  Today's Date: 08/09/2019  Problem List:  Patient Active Problem List   Diagnosis Date Noted  . Benign essential HTN   . Labile blood pressure   . Acute blood loss anemia   . Constipation   . Hypoalbuminemia due to protein-calorie malnutrition ([place])   . Atrial fibrillation ([place]) 08/07/2019  . History of embolic stroke 74/25/9563  . Essential hypertension 08/07/2019  . Hyperlipidemia 08/07/2019  . CKD (chronic kidney disease), stage IIIb 08/07/2019  . Right middle cerebral artery stroke ([place]) 08/07/2019  . Acute cardioembolic stroke ([facility]) d/t AF, s/p clot retrieval,  08/02/2019   Past Medical History:  Past Medical History:  Diagnosis Date  . Atrial fibrillation ([place])   . CAD (coronary artery disease)   . CKD (chronic kidney disease) stage 3, GFR 30-59 ml/min   . CVA (cerebral vascular accident) ([place])   . HLD (hyperlipidemia)   . HTN (hypertension)   . 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[facility])    Past Surgical History:  Past Surgical History:  Procedure Laterality Date  . IR CT HEAD LTD  08/02/2019  . IR PERCUTANEOUS ART THROMBECTOMY/INFUSION INTRACRANIAL INC DIAG ANGIO  08/02/2019      . IR PERCUTANEOUS ART THROMBECTOMY/INFUSION INTRACRANIAL INC DIAG ANGIO  08/02/2019  . RADIOLOGY WITH ANESTHESIA N/A 08/02/2019   Procedure: IR WITH ANESTHESIA;  Surgeon: Luanne Bras, MD;  Location: [facility];  Service: Radiology;  Laterality: N/A;   Social History:  has no history on file for tobacco use, alcohol use, and drug use.  Family / Support Systems Marital Status: Single Children: Daughter Scientist, product/process development) Anticipated Caregiver: Jameelah Ability/Limitations of Caregiver: none Caregiver Availability: 24/7  Social History Preferred language: English Religion:  Read: Yes Write: Yes   Abuse/Neglect Abuse/Neglect Assessment Can Be Completed: Yes Physical  Abuse: Denies Verbal Abuse: Denies Sexual Abuse: Denies Exploitation of patient/patient's resources: Denies Self-Neglect: Denies  Emotional Status Pt's affect, behavior and adjustment status: no Recent Psychosocial Issues: no Psychiatric History: no Substance Abuse History: no  Patient / Family Perceptions, Expectations & Goals Pt/Family understanding of illness & functional limitations: yes Pt/family expectations/goals: Goal to discharge home with daughter. Daughter transitioning them to a 2 bedroom apartment  [employer]: None Premorbid Home Care/DME Agencies: None Transportation available at discharge: Daughter able to transport  Discharge Planning Living Arrangements: Children Support Systems: Children Type of Residence: Private residence (3 Steps to enter) Does the patient have any problems obtaining your medications?: No Care Coordinator Anticipated Follow Up Needs: HH/OP  Clinical Impression SW entered room, introduced self, explained role ans process. Patient and daughter agreed to discharged on Friday. Sw will follow up with questions and concerns.  Dyanne Iha 08/09/2019, 11:21 AM

## 2019-08-09 NOTE — Progress Notes (Addendum)
Jodi [name]  Subjective/Complaints: Patient seen working with therapies this AM.  She states she slept well overnight.  She states she had a "rough" first day of therapies yesterday. Daughter at bedside. Discussed function and discharge with therapies and PA with plans to discuss with Education officer, museum.   ROS: Denies CP, SOB, N/V/D  Objective: Vital Signs: Blood pressure (!) 106/49, pulse (!) 58, temperature 98 F (36.7 C), resp. rate 16, height 5\' 3"  (1.6 m), weight 71.9 kg, SpO2 99 %. DG Abd 1 View  Result Date: 08/07/2019 CLINICAL DATA:  Constipation EXAM: ABDOMEN - 1 VIEW COMPARISON:  None. FINDINGS: Nonobstructed gas pattern with moderate stool in the colon. Phleboliths in the pelvis. No radiopaque calculi over the kidneys. IMPRESSION: Negative.  Moderate stool in the colon Electronically Signed   By: Donavan Foil M.D.   On: 08/07/2019 19:45   Recent Labs    08/07/19 0551 08/08/19 0516  WBC 5.3 5.5  HGB 11.4* 11.6*  HCT 35.5* 36.0  PLT 228 205   Recent Labs    08/07/19 0551 08/08/19 0516  NA 140 141  K 3.7 4.0  CL 107 106  CO2 25 24  GLUCOSE 92 96  BUN 7* 7*  CREATININE 1.19* 1.15*  CALCIUM 9.1 9.4    Physical Exam: BP (!) 106/49 (BP Location: Right Arm)   Pulse (!) 58   Temp 98 F (36.7 C)   Resp 16   Ht 5\' 3"  (1.6 m)   Wt 71.9 kg   SpO2 99%   BMI 28.08 kg/m  Constitutional: No distress . Vital signs reviewed. HENT: Normocephalic.  Atraumatic. Eyes: EOMI. No discharge. Cardiovascular: No JVD.  RRR. Respiratory: Normal effort.  No stridor.  Bilaterally clear to auscultation. GI: Non-distended. Skin: Warm and dry.  Intact. Psych: Normal mood.  Normal behavior. Musc: No edema in extremities.  No tenderness in extremities. Musc: No edema in extremities.  No tenderness in extremities. Neuro: Alert Motor: 5/5 throughout, unchanged  Assessment/Plan: 1. Functional deficits secondary to bilateral punctate  infarcts which require 3+ hours per day of interdisciplinary therapy in a comprehensive inpatient rehab setting.  Physiatrist is providing close team supervision and 24 hour management of active medical problems listed below.  Physiatrist and rehab team continue to assess barriers to discharge/monitor patient progress toward functional and medical goals  Care Tool:  Bathing    Body parts bathed by patient: Right arm, Left arm, Chest, Abdomen, Front perineal area, Buttocks, Right upper leg, Left upper leg, Right lower leg, Left lower leg, Face         Bathing assist Assist Level: Supervision/Verbal cueing     Upper Body Dressing/Undressing Upper body dressing   What is the patient wearing?: Hospital gown only    Upper body assist Assist Level: Set up assist    Lower Body Dressing/Undressing Lower body dressing      What is the patient wearing?: Incontinence brief     Lower body assist Assist for lower body dressing: Supervision/Verbal cueing     Toileting Toileting    Toileting assist Assist for toileting: Independent with assistive device Assistive Device Comment: walker   Transfers Chair/bed transfer  Transfers assist     Chair/bed transfer assist level: Independent with assistive device     Locomotion Ambulation   Ambulation assist              Walk 10 feet activity   Assist  Walk 50 feet activity   Assist           Walk 150 feet activity   Assist           Walk 10 feet on uneven surface  activity   Assist           Wheelchair     Assist               Wheelchair 50 feet with 2 turns activity    Assist            Wheelchair 150 feet activity     Assist            Medical Problem List and Plan: 1.  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 tomorrow 2.  Antithrombotics: -DVT/anticoagulation: Eliquis             -antiplatelet therapy: N/A 3. Pain Management: Tylenol as needed.  4. Mood: Provide emotional support             -antipsychotic agents: N/A 5. Neuropsych: This patient is capable of making decisions on her own behalf. 6. Skin/Wound Care: Routine skin checks 7. Fluids/Electrolytes/Nutrition: Routine in and outs 8.  Hypertension.  Lasix 20 mg daily, Lopressor 25 mg twice daily, verapamil 160 mg twice daily.    Labile on 6/24, monitor for trend  Monitor with increased mobility 9.  PAF.  Cardiac rate controlled.  Continue beta-blocker 10.  CKD stage IIIb.  Creatinine baseline 1.2 on admission.    Cr 1.15 on 6/23 11. Constipation:   KUB personally reviewed - constipation  Bowel meds increased again on 6/24 12. Moderate Hypoalbuminemia  Supplement initiated on 6/23 13. ABLA   Hb 11.6 on 6/23  Cont to monitor  LOS: 2 days A FACE TO FACE EVALUATION WAS PERFORMED  Jodi [name] 08/09/2019, 9:49 AM

## 2019-08-09 NOTE — Progress Notes (Signed)
Physical Therapy Session Note  Patient Details  Name: Temara Lanum MRN: [id number] Date of Birth: 02-03-1933  Today's Date: 08/09/2019 PT Individual Time: 1000-1100 PT Individual Time Calculation (min): 60 min   Short Term Goals: Week 1:  PT Short Term Goal 1 (Week 1): STG=LTG due to short LOS.  Skilled Therapeutic Interventions/Progress Updates:     PAIN Denies pain  Pt initially oob in wc and agreeable to treatment. STS from wc and gait x 19ft w/RW and supervision, mild decreased clearance L>R  Dynamic standing balance activities: alteranating tapping 4in step x 10 each w/cga to min assist Floor clocks RLE w/cga, LLE w/cga to min assist Tapping 5in cones in different patterns including straight line and clocks w/min to mod assist LLE, min assist RLE  Functional gait using RW including: Stepping over 5in curb, weaving thru cones at various intervals, stepping onto foam and balancing without UE support eyes open/eyes closed then w/cervical nods and turns/increased sway w/increased difficulty, picking up object from 5in step on floor all w/cga to min assist.  Repeated functional gait x 6 passes, rest between sets of 2, last pass included picking up cones from floor x 8, cga w/this.  Gait 155ft w/Rw and cga to supervision, mild decreased clearance L>R, turn/sit to recliner w/cga. Pt left oob in recliner w/chair alarm set and needs in reach.    Therapy Documentation Precautions:  Precautions Precautions: Fall Restrictions Weight Bearing Restrictions: No    Therapy/Group: Individual Therapy  Callie Fielding, [place] 08/09/2019, 12:26 PM

## 2019-08-10 ENCOUNTER — Inpatient Hospital Stay (HOSPITAL_COMMUNITY): Payer: MEDICARE | Admitting: Occupational Therapy

## 2019-08-10 NOTE — Progress Notes (Signed)
Patient d/c to home via car with daughter.

## 2019-08-10 NOTE — Discharge Instructions (Signed)
Inpatient Rehab Discharge Instructions  Jodi [name] Discharge date and time: No discharge date for patient encounter.   Activities/Precautions/ Functional Status: Activity: activity as tolerated Diet: regular diet Wound Care: none needed Functional status:  ___ No restrictions     ___ Walk up steps independently ___ 24/7 supervision/assistance   ___ Walk up steps with assistance ___ Intermittent supervision/assistance  ___ Bathe/dress independently ___ Walk with walker     _x__ Bathe/dress with assistance ___ Walk Independently    ___ Shower independently ___ Walk with assistance    ___ Shower with assistance ___ No alcohol     ___ Return to work/school ________  Special Instructions: COMMUNITY REFERRALS UPON DISCHARGE:    Home Health:   PT     OT     ST                      Agency: [facility] Phone: [phone]     No driving smoking or alcohol STROKE/TIA DISCHARGE INSTRUCTIONS SMOKING Cigarette smoking nearly doubles your risk of having a stroke & is the single most alterable risk factor  If you smoke or have smoked in the last 12 months, you are advised to quit smoking for your health.  Most of the excess cardiovascular risk related to smoking disappears within a year of stopping.  Ask you doctor about anti-smoking medications  [place] Quit Line: 1-800-QUIT NOW  Free Smoking Cessation Classes [phone]  CHOLESTEROL Know your levels; limit fat & cholesterol in your diet  Lipid Panel     Component Value Date/Time   CHOL 144 08/03/2019 0618   TRIG 53 08/03/2019 0618   HDL 78 08/03/2019 0618   CHOLHDL 1.8 08/03/2019 0618   VLDL 11 08/03/2019 0618   LDLCALC 55 08/03/2019 0618      Many patients benefit from treatment even if their cholesterol is at goal.  Goal: Total Cholesterol (CHOL) less than 160  Goal:  Triglycerides (TRIG) less than 150  Goal:  HDL greater than 40  Goal:  LDL (LDLCALC) less than 100   BLOOD PRESSURE American Stroke Association  blood pressure target is less that 120/80 mm/Hg  Your discharge blood pressure is:  BP: (!) 163/72  Monitor your blood pressure  Limit your salt and alcohol intake  Many individuals will require more than one medication for high blood pressure  DIABETES (A1c is a blood sugar average for last 3 months) Goal HGBA1c is under 7% (HBGA1c is blood sugar average for last 3 months)  Diabetes: No known diagnosis of diabetes    Lab Results  Component Value Date   HGBA1C 5.6 08/07/2019     Your HGBA1c can be lowered with medications, healthy diet, and exercise.  Check your blood sugar as directed by your physician  Call your physician if you experience unexplained or low blood sugars.  PHYSICAL ACTIVITY/REHABILITATION Goal is 30 minutes at least 4 days per week  Activity: Increase activity slowly, Therapies: Physical Therapy: Home Health Return to work:   Activity decreases your risk of heart attack and stroke and makes your heart stronger.  It helps control your weight and blood pressure; helps you relax and can improve your mood.  Participate in a regular exercise program.  Talk with your doctor about the best form of exercise for you (dancing, walking, swimming, cycling).  DIET/WEIGHT Goal is to maintain a healthy weight  Your discharge diet is:  Diet Order  Diet Heart Room service appropriate? Yes with Assist; Fluid consistency: Thin  Diet effective now                 liquids Your height is:  Height: 5\' 3"  (160 cm) Your current weight is: Weight: 71.9 kg Your Body Mass Index (BMI) is:  BMI (Calculated): 28.08  Following the type of diet specifically designed for you will help prevent another stroke.  Your goal weight range is:    Your goal Body Mass Index (BMI) is 19-24.  Healthy food habits can help reduce 3 risk factors for stroke:  High cholesterol, hypertension, and excess weight.  RESOURCES Stroke/Support Group:  Call [phone]   STROKE EDUCATION  PROVIDED/REVIEWED AND GIVEN TO PATIENT Stroke warning signs and symptoms How to activate emergency medical system (call 911). Medications prescribed at discharge. Need for follow-up after discharge. Personal risk factors for stroke. Pneumonia vaccine given:  Flu vaccine given:  My questions have been answered, the writing is legible, and I understand these instructions.  I will adhere to these goals & educational materials that have been provided to me after my discharge from the hospital.     My questions have been answered and I understand these instructions. I will adhere to these goals and the provided educational materials after my discharge from the hospital.  Patient/Caregiver Signature _______________________________ Date __________  Clinician Signature _______________________________________ Date __________  Please bring this form and your medication list with you to all your follow-up doctor's appointments.

## 2019-08-10 NOTE — Progress Notes (Signed)
[facility] PHYSICAL MEDICINE & REHABILITATION PROGRESS NOTE  Subjective/Complaints: Patient seen sitting up in a chair this morning.  She is awaiting her daughter ready for discharge.  She has questions regarding event monitor and location, believing that it was removed by nursing while in rehab.  Prolonged discussion with patient, ultimately discussed with daughter who states it is at home.  ROS: Denies CP, SOB, N/V/D  Objective: Vital Signs: Blood pressure 128/63, pulse 66, temperature 98.1 F (36.7 C), resp. rate 16, height 5\' 3"  (1.6 m), weight 71.9 kg, SpO2 98 %. No results found. Recent Labs    08/08/19 0516  WBC 5.5  HGB 11.6*  HCT 36.0  PLT 205   Recent Labs    08/08/19 0516  NA 141  K 4.0  CL 106  CO2 24  GLUCOSE 96  BUN 7*  CREATININE 1.15*  CALCIUM 9.4    Physical Exam: BP 128/63 (BP Location: Right Arm)   Pulse 66   Temp 98.1 F (36.7 C)   Resp 16   Ht 5\' 3"  (1.6 m)   Wt 71.9 kg   SpO2 98%   BMI 28.08 kg/m  Constitutional: No distress . Vital signs reviewed. HENT: Normocephalic.  Atraumatic. Eyes: EOMI. No discharge. Cardiovascular: No JVD. Respiratory: Normal effort.  No stridor. GI: Non-distended. Skin: Warm and dry.  Intact. Psych: Normal mood.  Normal behavior. Musc: No edema in extremities.  No tenderness in extremities. Neuro: Alert Motor: 5/5 throughout, stable  Assessment/Plan: 1. Functional deficits secondary to bilateral punctate infarcts which require 3+ hours per day of interdisciplinary therapy in a comprehensive inpatient rehab setting.  Physiatrist is providing close team supervision and 24 hour management of active medical problems listed below.  Physiatrist and rehab team continue to assess barriers to discharge/monitor patient progress toward functional and medical goals  Care Tool:  Bathing    Body parts bathed by patient: Right arm, Left arm, Chest, Abdomen, Front perineal area, Buttocks, Right upper leg, Left upper  leg, Right lower leg, Left lower leg, Face         Bathing assist Assist Level: Independent with assistive device     Upper Body Dressing/Undressing Upper body dressing   What is the patient wearing?: Pull over shirt    Upper body assist Assist Level: Independent    Lower Body Dressing/Undressing Lower body dressing      What is the patient wearing?: Underwear/pull up, Pants     Lower body assist Assist for lower body dressing: Independent with assitive device Assistive Device Comment: walker   Toileting Toileting    Toileting assist Assist for toileting: Independent with assistive device Assistive Device Comment: walker   Transfers Chair/bed transfer  Transfers assist     Chair/bed transfer assist level: Independent with assistive device Chair/bed transfer assistive device: Museum/gallery exhibitions officer assist      Assist level: Supervision/Verbal cueing Assistive device: Walker-rolling Max distance: 175   Walk 10 feet activity   Assist     Assist level: Supervision/Verbal cueing Assistive device: Walker-rolling   Walk 50 feet activity   Assist    Assist level: Contact Guard/Touching assist Assistive device: Walker-rolling    Walk 150 feet activity   Assist    Assist level: Supervision/Verbal cueing Assistive device: Walker-rolling    Walk 10 feet on uneven surface  activity   Assist     Assist level: Minimal Assistance - Patient > 75% Assistive device: Hand held assist   Wheelchair  Assist Will patient use wheelchair at discharge?: No   Wheelchair activity did not occur: N/A         Wheelchair 50 feet with 2 turns activity    Assist    Wheelchair 50 feet with 2 turns activity did not occur: N/A       Wheelchair 150 feet activity     Assist Wheelchair 150 feet activity did not occur: N/A          Medical Problem List and Plan: 1.  Decreased functional mobility with altered  mental status  secondary to right MCA small punctate/scattered infarcts with punctate left MCA ACA and right cerebellar infarcts with right M2 occlusion status post revascularization as well as recent right cerebellar CVA 07/22/2019 with evaluation at [place] today  Will see patient for hospital follow-up in 1 month post-discharge 2.  Antithrombotics: -DVT/anticoagulation: Eliquis             -antiplatelet therapy: N/A 3. Pain Management: Tylenol as needed.  4. Mood: Provide emotional support             -antipsychotic agents: N/A 5. Neuropsych: This patient is capable of making decisions on her own behalf. 6. Skin/Wound Care: Routine skin checks 7. Fluids/Electrolytes/Nutrition: Routine in and outs 8.  Hypertension.  Lasix 20 mg daily, Lopressor 25 mg twice daily, verapamil 160 mg twice daily.    Relatively controlled on 6/25  Monitor with increased mobility 9.  PAF.  Cardiac rate controlled.  Continue beta-blocker  Discussed event monitor with patient and daughter 67.  CKD stage IIIb.  Creatinine baseline 1.2 on admission.    Cr 1.15 on 6/23 11. Constipation:   KUB personally reviewed - constipation  Bowel meds increased again on 6/24  Improving 12. Moderate Hypoalbuminemia  Supplement initiated on 6/23 13. ABLA   Hb 11.6 on 6/23  Cont to monitor  > 30 minutes spent in total in discharge planning between myself and PA regarding aforementioned, as well discussion regarding DME equipment, follow-up appointments, follow-up therapies, discharge medications, discharge recommendations, cardiac monitoring  LOS: 3 days A FACE TO FACE EVALUATION WAS PERFORMED  Jodi [name] Jodi [name] 08/10/2019, 9:11 AM

## 2019-08-10 NOTE — Care Management (Signed)
Patient ID: Jodi [name], female   DOB: Jul 10, 1932, 86 y.o.   MRN: [id number] Met with the patient to review role of the CM and collaboration with the SW Margreta Journey) to address nursing needs and facilitate prep for discharge. Reviewed risk factors for stroke including PAF, HTn and HLD. Patient given information on medications, diet and secondary stroke prevention. Plan for short LOS and patient feels ready for discharge. Reviewed importance of taking medications, follow up with MD and MD notification of any changes/side effects noted with medications based on history of adverse reactions. Continue to follow along to discharge for nursing needs.

## 2019-08-10 NOTE — Plan of Care (Signed)
  Problem: RH SAFETY Goal: RH STG ADHERE TO SAFETY PRECAUTIONS W/ASSISTANCE/DEVICE Description: STG Adhere to Safety Precautions With Supervision Outcome: Completed/Met Goal: RH STG DECREASED RISK OF FALL WITH ASSISTANCE Description: STG Decreased Risk of Fall With Supervision Outcome: Completed/Met   Problem: RH COGNITION-NURSING Goal: RH STG USES MEMORY AIDS/STRATEGIES W/ASSIST TO PROBLEM SOLVE Description: STG Uses Memory Aids/Strategies With Mod I to Problem Solve. Outcome: Completed/Met Goal: RH STG ANTICIPATES NEEDS/CALLS FOR ASSIST W/ASSIST/CUES Description: STG Anticipates Needs/Calls for Assist With Mod i Outcome: Completed/Met   Problem: RH KNOWLEDGE DEFICIT Goal: RH STG INCREASE KNOWLEDGE OF DIABETES Outcome: Completed/Met Goal: RH STG INCREASE KNOWLEDGE OF HYPERTENSION Outcome: Completed/Met Goal: RH STG INCREASE KNOWLEDGE OF DYSPHAGIA/FLUID INTAKE Outcome: Completed/Met Goal: RH STG INCREASE KNOWLEGDE OF HYPERLIPIDEMIA Outcome: Completed/Met Goal: RH STG INCREASE KNOWLEDGE OF STROKE PROPHYLAXIS Outcome: Completed/Met   Problem: Consults Goal: RH STROKE PATIENT EDUCATION Description: See Patient Education module for education specifics  Outcome: Completed/Met

## 2019-08-14 ENCOUNTER — Telehealth: Payer: Self-pay | Admitting: *Deleted

## 2019-08-14 NOTE — Telephone Encounter (Signed)
Transitional care call completed, appointment confirmed.  Moved up to 1:40 PM.  Address confirmed   1. Are you/is patient experiencing any problems since coming home? No  Are there any questions regarding any aspect of care?  No  2. Are there any questions regarding medications administration/dosing? No Are meds being taken as prescribed? Yes Patient should review meds with caller to confirm   3. Have there been any falls? No  4. Has Home Health been to the house and/or have they contacted you? Yes If not, have you tried to contact them? Can we help you contact them?   5. Are bowels and bladder emptying properly?  Yes Are there any unexpected incontinence issues? No  If applicable, is patient following bowel/bladder programs?   6. Any fevers, problems with breathing, unexpected pain? No   7. Are there any skin problems or new areas of breakdown?  No  8. Has the patient/family member arranged specialty MD follow up (ie cardiology/neurology/renal/surgical/etc)? Yes Can we help arrange?   9. Does the patient need any other services or support that we can help arrange? Transportation, advised family on [facility] program as they transfer care over to the [place] area   10. Are caregivers following through as expected in assisting the patient?  Yes  11. Has the patient quit smoking, drinking alcohol, or using drugs as recommended? Patient does not do these things

## 2019-08-21 ENCOUNTER — Encounter: Payer: Self-pay | Admitting: Registered Nurse

## 2019-08-21 ENCOUNTER — Encounter: Payer: MEDICARE | Admitting: Registered Nurse

## 2019-08-21 ENCOUNTER — Encounter: Payer: MEDICARE | Attending: Registered Nurse | Admitting: Registered Nurse

## 2019-08-21 ENCOUNTER — Other Ambulatory Visit: Payer: Self-pay

## 2019-08-21 VITALS — BP 168/74 | HR 61 | Temp 98.6°F | Ht 63.0 in | Wt 158.0 lb

## 2019-08-21 DIAGNOSIS — I63511 Cerebral infarction due to unspecified occlusion or stenosis of right middle cerebral artery: Secondary | ICD-10-CM | POA: Diagnosis present

## 2019-08-21 DIAGNOSIS — I639 Cerebral infarction, unspecified: Secondary | ICD-10-CM

## 2019-08-21 DIAGNOSIS — I10 Essential (primary) hypertension: Secondary | ICD-10-CM | POA: Insufficient documentation

## 2019-08-21 NOTE — Progress Notes (Signed)
Subjective:    Patient ID: Jodi [name], female    DOB: 1932/07/16, 86 y.o.   MRN: [id number]  HPI: Jodi [name] is a 86 y.o. female who is here for Transitional care visit in follow up of her Right middle cerebral artery stroke and essential hypertension. She presented to [facility] on 08/02/2019 as code stroke, with altered mental status, she was transferred to [facility]. Neurology was consulted.  CT Head WO Contrast:  IMPRESSION: 1. Chronic small-vessel ischemic changes of the cerebral hemispheric white matter. Probable acute or subacute infarction in the left cerebellar hemisphere. No mass effect or hemorrhage. 2. These results were called by telephone at the time of interpretation on 08/02/2019 at 4:17 pm to provider Dr. Melina Copa, who verbally acknowledged these results. CT Angio Head/ Neck  IMPRESSION: Occlusion of a right proximal M2 branch. No other abnormal anterior circulation finding.  Posterior circulation intracranial branch vessels are intact.  No carotid bifurcation disease.  No vertebral artery origin disease.  Minimal aortic atherosclerosis.  Ms. Cassara underwent thrombectomy on 08/02/2019. Ms. Angelini had a recent cerebellar stroke on 07/22/2019 and was discharged from [facility] on 07/27/2019.  She was admitted to inpatient rehabilitation on 06/22/201 and discharged home on 08/10/2019. She is receiving home health therapy with [facility]. Also reports her appetite is fair. She denies any pain. She rated her pain 0.    Pain Inventory Average Pain 0 Pain Right Now 0 My pain is no pain  In the last 24 hours, has pain interfered with the following? General activity 0 Relation with others 0 Enjoyment of life 0 What TIME of day is your pain at its worst? no pain Sleep (in general) Good  Pain is worse with: no pain Pain improves with: no pain Relief from Meds: no pain  Mobility walk with assistance use a walker ability to climb  steps?  yes do you drive?  no  Function retired  Neuro/Psych No problems in this area  Prior Studies transitional care  Physicians involved in your care transitional care   Family History  Problem Relation Age of Onset  . Prostate cancer Father    Social History   Socioeconomic History  . Marital status: Widowed    Spouse name: Not on file  . Number of children: Not on file  . Years of education: Not on file  . Highest education level: Not on file  Occupational History  . Not on file  Tobacco Use  . Smoking status: Never Smoker  . Smokeless tobacco: Never Used  Substance and Sexual Activity  . Alcohol use: Not on file  . Drug use: Not on file  . Sexual activity: Not on file  Other Topics Concern  . Not on file  Social History Narrative  . Not on file   Social Determinants of Health   Financial Resource Strain:   . Difficulty of Paying Living Expenses:   Food Insecurity:   . Worried About Charity fundraiser in the Last Year:   . Arboriculturist in the Last Year:   Transportation Needs:   . Film/video editor (Medical):   Marland Kitchen Lack of Transportation (Non-Medical):   Physical Activity:   . Days of Exercise per Week:   . Minutes of Exercise per Session:   Stress:   . Feeling of Stress :   Social Connections:   . Frequency of Communication with Friends and Family:   . Frequency of Social Gatherings with Friends  and Family:   . Attends Religious Services:   . Active Member of Clubs or Organizations:   . Attends Archivist Meetings:   Marland Kitchen Marital Status:    Past Surgical History:  Procedure Laterality Date  . IR CT HEAD LTD  08/02/2019  . IR PERCUTANEOUS ART THROMBECTOMY/INFUSION INTRACRANIAL INC DIAG ANGIO  08/02/2019      . IR PERCUTANEOUS ART THROMBECTOMY/INFUSION INTRACRANIAL INC DIAG ANGIO  08/02/2019  . RADIOLOGY WITH ANESTHESIA N/A 08/02/2019   Procedure: IR WITH ANESTHESIA;  Surgeon: Luanne Bras, MD;  Location: [facility];  Service:  Radiology;  Laterality: N/A;   Past Medical History:  Diagnosis Date  . Atrial fibrillation ([place])   . CAD (coronary artery disease)   . CKD (chronic kidney disease) stage 3, GFR 30-59 ml/min   . CVA (cerebral vascular accident) ([place])   . HLD (hyperlipidemia)   . HTN (hypertension)   . 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([facility])    BP (!) 168/74   Pulse 61   Temp 98.6 F (37 C)   Ht 5\' 3"  (1.6 m)   Wt 158 lb (71.7 kg)   SpO2 97%   BMI 27.99 kg/m   Opioid Risk Score:   Fall Risk Score:  `1  Depression screen PHQ 2/9  No flowsheet data found.  Review of Systems  Constitutional: Negative.   HENT: Negative.   Eyes: Negative.   Respiratory: Negative.   Cardiovascular: Negative.   Gastrointestinal: Negative.   Endocrine: Negative.   Genitourinary: Negative.   Musculoskeletal: Positive for gait problem.  Skin: Negative.   Allergic/Immunologic: Negative.   Hematological: Negative.   Psychiatric/Behavioral: Negative.   All other systems reviewed and are negative.      Objective:   Physical Exam Vitals and nursing note reviewed.  Constitutional:      Appearance: Normal appearance.  Cardiovascular:     Rate and Rhythm: Normal rate and regular rhythm.     Pulses: Normal pulses.     Heart sounds: Normal heart sounds.  Pulmonary:     Effort: Pulmonary effort is normal.     Breath sounds: Normal breath sounds.  Musculoskeletal:     Cervical back: Normal range of motion and neck supple.     Comments: Normal Muscle Bulk and Muscle Testing Reveals:  Upper Extremities: Full ROM and Muscle Strength 5/5 Lower Extremities: Full ROM and Muscle Strength 5/5 Arises from Table with walker for support Narrow Based  Gait   Skin:    General: Skin is warm and dry.  Neurological:     Mental Status: She is alert and oriented to person, place, and time.  Psychiatric:        Mood and Affect: Mood normal.        Behavior: Behavior normal.           Assessment & Plan:  1.Right middle cerebral  artery stroke: Continue with Home Health Therapy with [facility]. She has a Neurolofy HFU appointment scheduled. Continue current medication regimen. Continue to monitor.  2. Essential hypertension: Continue Current medication regimen. PCP following. Continue to monitor.   F/U with Dr Posey Pronto in 4-6 weeks

## 2019-09-13 ENCOUNTER — Other Ambulatory Visit: Payer: Self-pay

## 2019-09-13 ENCOUNTER — Ambulatory Visit (INDEPENDENT_AMBULATORY_CARE_PROVIDER_SITE_OTHER): Payer: MEDICARE | Admitting: Adult Health

## 2019-09-13 ENCOUNTER — Encounter: Payer: Self-pay | Admitting: Adult Health

## 2019-09-13 VITALS — BP 150/90 | HR 62 | Ht 63.0 in | Wt 155.0 lb

## 2019-09-13 DIAGNOSIS — I10 Essential (primary) hypertension: Secondary | ICD-10-CM

## 2019-09-13 DIAGNOSIS — E782 Mixed hyperlipidemia: Secondary | ICD-10-CM | POA: Diagnosis not present

## 2019-09-13 DIAGNOSIS — I639 Cerebral infarction, unspecified: Secondary | ICD-10-CM | POA: Diagnosis not present

## 2019-09-13 DIAGNOSIS — I480 Paroxysmal atrial fibrillation: Secondary | ICD-10-CM

## 2019-09-13 NOTE — Progress Notes (Signed)
[facility] [address] [place]. [place] [address] [phone]       HOSPITAL FOLLOW UP NOTE  [name] Date of Birth:  August 24, 1932 Medical Record Number:  [id number]   Reason for Referral:  hospital stroke follow up    SUBJECTIVE:   CHIEF COMPLAINT:  Chief Complaint  Patient presents with  . Follow-up    rm 5 here for a f/u on a stroke. Pt said she is having no new sx. She is here with her daughter Jodi [name]    HPI:   JodiJodi Suttonis a 86 y.o.femalewith history ofatrial fibrillation (Plavix PTA), CAD, CKD, HLD, HTN, stomach cancer,aspirin allergy,recent cerebellar stroke on June 6([facility]), who presents on 08/02/2019 with confusion and speech difficulties. Stroke work-up revealed right MCA small/punctate scattered infarcts with punctate left MCA/ACA and right cerebellar infarcts with right M2 occlusion s/p IR with TICI 3 reperfusion, infarcts embolic pattern due to proximal A. fib or flutter.  Recommend initiating Eliquis for secondary stroke prevention and history of atrial fibrillation.  HTN and HLD stable.  Resumed home medications.  Other stroke risk factors include CAD and recent left cerebellar stroke on 07/22/2019 evaluated at [facility].  Evaluated by therapies and recommended discharge to CIR for ongoing therapy needs.  Stroke: right MCA small/punctate scattered infarcts with punctate left MCA/ACA and right cerebellum infarcts with right M2 occlusion s/p TICI3 reperfusion - embolic pattern due to paroxysmal AF or Aflutter  CT Head- subacute infarction in the left cerebellar hemisphere. No mass effect or hemorrhage.   CTA H&N- Occlusion of a right proximal M2 branch.   IR - thrombectomy R M2 posterior branch occlusion with TICI3 revascularization  Post IR CT no hemorrhage   MRI head -right MCA small/punctate scattered infarcts with punctate left MCA/ACA and right cerebellum infarcts  MRA head - right MCA M2 patent  2D Echo EF 60 to  65%  [employer] Virus 2 - negative  LDL 55 -continue atorvastatin  HgbA1c5.6  VTE prophylaxis - Elquis  clopidogrel 75 mg dailyprior to admission, now on Eliquis   Therapy recommendations: CIR  Disposition: CIR   Today, 09/12/2017, Jodi [name] is being seen for hospital follow-up accompanied by her daughter.  Discharged home from [place] on 08/10/2019 after uneventful 3-day stay. She has recovered well from a stroke standpoint without residual deficits. She has completed therapy and daughter continues to work with her daily routine exercise and physical activity. Ambulates with a cane which is her baseline. She has returned back to all prior activities without difficulty. Remains on Eliquis without bleeding or bruising.  Remains on atorvastatin without myalgias.  Blood pressure today 150/90 -has not taken her morning meds. Monitors at home and typically stable. No concerns at this time.      ROS:   14 system review of systems performed and negative with exception of no complaints  PMH:  Past Medical History:  Diagnosis Date  . Atrial fibrillation ([place])   . CAD (coronary artery disease)   . CKD (chronic kidney disease) stage 3, GFR 30-59 ml/min   . CVA (cerebral vascular accident) ([place])   . HLD (hyperlipidemia)   . HTN (hypertension)   . 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([place])     PSH:  Past Surgical History:  Procedure Laterality Date  . IR CT HEAD LTD  08/02/2019  . IR PERCUTANEOUS ART THROMBECTOMY/INFUSION INTRACRANIAL INC DIAG ANGIO  08/02/2019      . IR PERCUTANEOUS ART THROMBECTOMY/INFUSION INTRACRANIAL INC DIAG ANGIO  08/02/2019  . RADIOLOGY WITH  ANESTHESIA N/A 08/02/2019   Procedure: IR WITH ANESTHESIA;  Surgeon: Luanne Bras, MD;  Location: [place] [facility];  Service: Radiology;  Laterality: N/A;    Social History:  Social History   Socioeconomic History  . Marital status: Widowed    Spouse name: Not on file  . Number of children: Not on file  . Years of education: Not on file  .  Highest education level: Not on file  Occupational History  . Not on file  Tobacco Use  . Smoking status: Never Smoker  . Smokeless tobacco: Never Used  Substance and Sexual Activity  . Alcohol use: Not on file  . Drug use: Not on file  . Sexual activity: Not on file  Other Topics Concern  . Not on file  Social History Narrative  . Not on file   Social Determinants of Health   Financial Resource Strain:   . Difficulty of Paying Living Expenses:   Food Insecurity:   . Worried About Charity fundraiser in the Last Year:   . Arboriculturist in the Last Year:   Transportation Needs:   . Film/video editor (Medical):   Marland Kitchen Lack of Transportation (Non-Medical):   Physical Activity:   . Days of Exercise per Week:   . Minutes of Exercise per Session:   Stress:   . Feeling of Stress :   Social Connections:   . Frequency of Communication with Friends and Family:   . Frequency of Social Gatherings with Friends and Family:   . Attends Religious Services:   . Active Member of Clubs or Organizations:   . Attends Archivist Meetings:   Marland Kitchen Marital Status:   Intimate Partner Violence:   . Fear of Current or Ex-Partner:   . Emotionally Abused:   Marland Kitchen Physically Abused:   . Sexually Abused:     Family History:  Family History  Problem Relation Age of Onset  . Prostate cancer Father     Medications:   Current Outpatient Medications on File Prior to Visit  Medication Sig Dispense Refill  . acetaminophen (TYLENOL) 325 MG tablet Take 2 tablets (650 mg total) by mouth every 4 (four) hours as needed for mild pain (or temp > 37.5 C (99.5 F)).    Marland Kitchen albuterol (VENTOLIN HFA) 108 (90 Base) MCG/ACT inhaler Inhale 2 puffs into the lungs every 4 (four) hours as needed for wheezing or shortness of breath. 6.7 g 1  . apixaban (ELIQUIS) 5 MG TABS tablet Take 1 tablet (5 mg total) by mouth 2 (two) times daily. 60 tablet 1  . atorvastatin (LIPITOR) 40 MG tablet Take 1 tablet (40 mg total)  by mouth at bedtime. 30 tablet 0  . Cholecalciferol (VITAMIN D) 50 MCG (2000 UT) CAPS Take 1 capsule (2,000 Units total) by mouth daily. 30 capsule 1  . furosemide (LASIX) 20 MG tablet Take 1 tablet (20 mg total) by mouth daily. 30 tablet 1  . metoprolol tartrate (LOPRESSOR) 25 MG tablet Take 1 tablet (25 mg total) by mouth 2 (two) times daily. 60 tablet 1  . polyethylene glycol (MIRALAX / GLYCOLAX) 17 g packet Take 17 g by mouth daily. 14 each 0  . senna (SENOKOT) 8.6 MG tablet Take 1 tablet by mouth daily as needed for constipation.    . verapamil (CALAN) 80 MG tablet Take 2 tablets (160 mg total) by mouth 2 (two) times daily. 60 tablet 0   No current facility-administered medications on file prior to visit.  Allergies:   Allergies  Allergen Reactions  . Aciphex [Rabeprazole]     Hives  . Aspirin     Anaphylaxis  . Hydralazine     Nausea  . Morphine And Related     Emesis      OBJECTIVE:  Physical Exam  Vitals:   09/13/19 0752  BP: (!) 150/90  Pulse: 62  Weight: 155 lb (70.3 kg)  Height: 5\' 3"  (1.6 m)   Body mass index is 27.46 kg/m. No exam data present  General: well developed, well nourished,  very pleasant elderly female, seated, in no evident distress Head: head normocephalic and atraumatic.   Neck: supple with no carotid or supraclavicular bruits Cardiovascular: regular rate and rhythm, no murmurs Musculoskeletal: no deformity Skin:  no rash/petichiae Vascular:  Normal pulses all extremities   Neurologic Exam Mental Status: Awake and fully alert.  Fluent speech and language. Oriented to place and time. Recent and remote memory intact. Attention span, concentration and fund of knowledge appropriate. Mood and affect appropriate.  Cranial Nerves: Fundoscopic exam reveals sharp disc margins. Pupils equal, briskly reactive to light. Extraocular movements full without nystagmus. Visual fields full to confrontation. Hearing intact. Facial sensation intact. Face,  tongue, palate moves normally and symmetrically.  Motor: Normal bulk and tone. Normal strength in all tested extremity muscles. Sensory.: intact to touch , pinprick , position and vibratory sensation.  Coordination: Rapid alternating movements normal in all extremities. Finger-to-nose and heel-to-shin performed accurately bilaterally. Gait and Station: Arises from chair without difficulty. Stance is normal. Gait demonstrates normal stride length and balance with use of cane. Romberg negative. Reflexes: 1+ and symmetric. Toes downgoing.     NIHSS  0 Modified Rankin  0 CHA2DS2-VASc 7 HAS-BLED 2     ASSESSMENT: Jodi [name] is a 86 y.o. year old female presented with confusion and speech difficulties on 08/02/2019 with stroke work-up revealing right MCA small/punctate scattered infarcts with punctate left MCA/ACA and right cerebellum infarcts with right M2 occlusion s/p IR with TICI 3 reperfusion, infarcts embolic secondary to proximal A. fib or a flutter. Vascular risk factors include recent left cerebellar stroke 07/22/2019 [facility]), atrial fibrillation vs a flutter, HTN, HLD and CAD.      PLAN:  1. Embolic strokes: Recovered well without residual deficits. Continue Eliquis (apixaban) daily  and atorvastatin 40 mg daily for secondary stroke prevention. Close PCP follow up for aggressive stroke risk factor management  2. A. Fib vs a flutter: Continue Eliquis and continue to follow-up with established cardiology 3. HTN: BP goal <130/90. Continue f/u with PCP 4. HLD: LDL goal <70. Recent LDL 55. Continue atorvastatin; f/u with PCP for management as well as prescribing of statin   Per daughter request, follow-up on an as-needed basis as she is seen routinely by other providers in [place], [place]   I spent 45 minutes of face-to-face and non-face-to-face time with patient and daughter.  This included previsit chart review, lab review, study review, order entry, electronic health record  documentation, patient education regarding recent stroke, importance of managing stroke risk factors and importance of ongoing use of Eliquis and answered all questions to patient and daughters satisfaction    Jodi [name], [facility]  [facility] [address] [place], [place] [address]  Phone [phone] Fax [phone] Note: This document was prepared with digital dictation and possible smart phrase technology. Any transcriptional errors that result from this process are unintentional.

## 2019-09-13 NOTE — Patient Instructions (Signed)
You have recovered excellently from your recent strokes! Keep up the good work with routine exercise and daily activity!  Continue Eliquis (apixaban) daily  and atorvastatin for secondary stroke prevention  Continue to follow up with PCP regarding cholesterol and blood pressure management  Maintain strict control of hypertension with blood pressure goal below 130/90, diabetes with hemoglobin A1c goal below 6.5% and cholesterol with LDL cholesterol (bad cholesterol) goal below 70 mg/dL.     It was a pleasure to meet you today and please do not hesitate to call if you have any questions or concerns in the future       Thank you for coming to see [place] at [facility]. I hope we have been able to provide you high quality care today.  You may receive a patient satisfaction survey over the next few weeks. We would appreciate your feedback and comments so that we may continue to improve ourselves and the health of our patients.

## 2019-09-13 NOTE — Progress Notes (Signed)
I agree with the above plan 

## 2019-10-02 ENCOUNTER — Encounter: Payer: MEDICARE | Attending: Registered Nurse | Admitting: Physical Medicine & Rehabilitation

## 2019-10-02 DIAGNOSIS — I10 Essential (primary) hypertension: Secondary | ICD-10-CM | POA: Insufficient documentation

## 2019-10-02 DIAGNOSIS — I63511 Cerebral infarction due to unspecified occlusion or stenosis of right middle cerebral artery: Secondary | ICD-10-CM | POA: Insufficient documentation

## 2019-10-30 ENCOUNTER — Other Ambulatory Visit: Payer: Self-pay

## 2019-10-30 NOTE — Patient Outreach (Signed)
[facility]) Care Management  10/30/2019  Jodi [name] 01-Jun-1932 [id number]   Telephone outreach to patient to obtain mRS was successfully completed. MRS=0 Spoke with patient's daughter, [name].  [place] Management Assistant [phone]

## 2020-06-23 ENCOUNTER — Encounter (HOSPITAL_COMMUNITY): Payer: Self-pay

## 2020-06-23 ENCOUNTER — Telehealth (HOSPITAL_COMMUNITY): Payer: Self-pay | Admitting: Emergency Medicine

## 2020-06-23 ENCOUNTER — Telehealth (HOSPITAL_COMMUNITY): Payer: Self-pay

## 2020-06-23 ENCOUNTER — Other Ambulatory Visit: Payer: Self-pay

## 2020-06-23 ENCOUNTER — Ambulatory Visit (HOSPITAL_COMMUNITY)
Admission: EM | Admit: 2020-06-23 | Discharge: 2020-06-23 | Disposition: A | Discharge status: Home / Self Care | Payer: MEDICARE | Attending: Emergency Medicine | Admitting: Emergency Medicine

## 2020-06-23 DIAGNOSIS — L03012 Cellulitis of left finger: Secondary | ICD-10-CM

## 2020-06-23 MED ORDER — DOXYCYCLINE CALCIUM 50 MG/5ML PO SYRP: 100.0000 mg | ORAL_SOLUTION | Freq: Two times a day (BID) | ORAL | 0 refills | Status: AC

## 2020-06-23 MED ORDER — DOXYCYCLINE HYCLATE 100 MG PO CAPS: 100.0000 mg | ORAL_CAPSULE | Freq: Two times a day (BID) | ORAL | 0 refills | Status: DC

## 2020-06-23 NOTE — ED Provider Notes (Signed)
[facility]    CSN: [id number] Arrival date & time: 06/23/20  0941      History   Chief Complaint Chief Complaint  Patient presents with  . finger swelling    HPI Jodi [name] is a 87 y.o. female.   Patient presents with swelling and tenderness on nailbed of left thumb present for 1 month. 2 days ago site began to drain purulent discharge with odor. Denies numbness or tingling. ROM intact without pain. Has occurred before on a different finger. Self resolved. Denies injury to nail or finger, fever or chills.   Past Medical History:  Diagnosis Date  . Atrial fibrillation ([place])   . CAD (coronary artery disease)   . CKD (chronic kidney disease) stage 3, GFR 30-59 ml/min ([facility])   . CVA (cerebral vascular accident) ([place])   . HLD (hyperlipidemia)   . HTN (hypertension)   . 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[facility])     Patient Active Problem List   Diagnosis Date Noted  . Benign essential HTN   . Labile blood pressure   . Acute blood loss anemia   . Constipation   . Hypoalbuminemia due to protein-calorie malnutrition ([place])   . Atrial fibrillation ([place]) 08/07/2019  . History of embolic stroke 78/46/9629  . Essential hypertension 08/07/2019  . Hyperlipidemia 08/07/2019  . CKD (chronic kidney disease), stage IIIb 08/07/2019  . Right middle cerebral artery stroke ([place]) 08/07/2019  . Acute cardioembolic stroke ([facility]) d/t AF, s/p clot retrieval,  08/02/2019    Past Surgical History:  Procedure Laterality Date  . IR CT HEAD LTD  08/02/2019  . IR PERCUTANEOUS ART THROMBECTOMY/INFUSION INTRACRANIAL INC DIAG ANGIO  08/02/2019      . IR PERCUTANEOUS ART THROMBECTOMY/INFUSION INTRACRANIAL INC DIAG ANGIO  08/02/2019  . RADIOLOGY WITH ANESTHESIA N/A 08/02/2019   Procedure: IR WITH ANESTHESIA;  Surgeon: Luanne Bras, MD;  Location: [facility];  Service: Radiology;  Laterality: N/A;    OB History   No obstetric history on file.      Home Medications    Prior to Admission medications    Medication Sig Start Date End Date Taking? Authorizing Provider  albuterol (VENTOLIN HFA) 108 (90 Base) MCG/ACT inhaler Inhale 2 puffs into the lungs every 4 (four) hours as needed for wheezing or shortness of breath. 08/09/19  Yes Angiulli, Lavon Paganini, PA-C  doxycycline (VIBRAMYCIN) 100 MG capsule Take 1 capsule (100 mg total) by mouth 2 (two) times daily. 06/23/20  Yes Deborahann Poteat, Leitha Schuller, NP  acetaminophen (TYLENOL) 325 MG tablet Take 2 tablets (650 mg total) by mouth every 4 (four) hours as needed for mild pain (or temp > 37.5 C (99.5 F)). 08/09/19   Angiulli, Lavon Paganini, PA-C  apixaban (ELIQUIS) 5 MG TABS tablet Take 1 tablet (5 mg total) by mouth 2 (two) times daily. 08/09/19   Angiulli, Lavon Paganini, PA-C  atorvastatin (LIPITOR) 40 MG tablet Take 1 tablet (40 mg total) by mouth at bedtime. 08/09/19   Angiulli, Lavon Paganini, PA-C  Cholecalciferol (VITAMIN D) 50 MCG (2000 UT) CAPS Take 1 capsule (2,000 Units total) by mouth daily. 08/09/19   Angiulli, Lavon Paganini, PA-C  furosemide (LASIX) 20 MG tablet Take 1 tablet (20 mg total) by mouth daily. 08/09/19   Angiulli, Lavon Paganini, PA-C  metoprolol tartrate (LOPRESSOR) 25 MG tablet Take 1 tablet (25 mg total) by mouth 2 (two) times daily. 08/09/19   Angiulli, Lavon Paganini, PA-C  polyethylene glycol (MIRALAX / GLYCOLAX) 17 g packet Take 17 g by mouth daily.  08/08/19   Donzetta Starch, NP  senna (SENOKOT) 8.6 MG tablet Take 1 tablet by mouth daily as needed for constipation.    [provider]  verapamil (CALAN) 80 MG tablet Take 2 tablets (160 mg total) by mouth 2 (two) times daily. 08/09/19   Angiulli, Lavon Paganini, PA-C    Family History Family History  Problem Relation Age of Onset  . Prostate cancer Father     Social History Social History   Tobacco Use  . Smoking status: Never Smoker  . Smokeless tobacco: Never Used  Substance Use Topics  . Alcohol use: Not Currently  . Drug use: Not Currently     Allergies   Aciphex [rabeprazole], Aspirin, Hydralazine, and  Morphine and related   Review of Systems Review of Systems  Defer to HPI   Physical Exam Triage Vital Signs ED Triage Vitals  Enc Vitals Group     BP 06/23/20 1049 (!) 168/58     Pulse Rate 06/23/20 1049 (!) 54     Resp 06/23/20 1049 18     Temp 06/23/20 1049 98 F (36.7 C)     Temp src --      SpO2 06/23/20 1049 100 %     Weight --      Height --      Head Circumference --      Peak Flow --      Pain Score 06/23/20 1046 10     Pain Loc --      Pain Edu? --      Excl. in [place]? --    No data found.  Updated Vital Signs BP (!) 168/58   Pulse (!) 54   Temp 98 F (36.7 C)   Resp 18   SpO2 100%   Visual Acuity Right Eye Distance:   Left Eye Distance:   Bilateral Distance:    Right Eye Near:   Left Eye Near:    Bilateral Near:     Physical Exam Constitutional:      Appearance: Normal appearance. She is normal weight.  HENT:     Head: Normocephalic.  Eyes:     Extraocular Movements: Extraocular movements intact.  Pulmonary:     Effort: Pulmonary effort is normal.  Musculoskeletal:        General: Normal range of motion.     Cervical back: Normal range of motion.  Skin:    Comments: Swelling and tenderness present at the base of the left lateral nail fold, scant clear drainage manually expelled from site, ROM of finger intact, joint involvement not noted   Neurological:     Mental Status: She is alert and oriented to person, place, and time. Mental status is at baseline.  Psychiatric:        Mood and Affect: Mood normal.        Behavior: Behavior normal.        Thought Content: Thought content normal.        Judgment: Judgment normal.      UC Treatments / Results  Labs (all labs ordered are listed, but only abnormal results are displayed) Labs Reviewed - No data to display  EKG   Radiology No results found.  Procedures Procedures (including critical care time)  Medications Ordered in UC Medications - No data to display  Initial Impression  / Assessment and Plan / UC Course  I have reviewed the triage vital signs and the nursing notes.  Pertinent labs & imaging results that were  available during my care of the patient were reviewed by me and considered in my medical decision making (see chart for details).  Paronychia of left thumb  1. Doxycycline 100 mg bid for 7 days 2. Warm soaks at least 4 times a day' 3. Strict return precautions given for worsening infection, verbalized understanding  Final Clinical Impressions(s) / UC Diagnoses   Final diagnoses:  Paronychia of left thumb     Discharge Instructions     Take antibiotic twice a day for the next 7 days  Warm soaks to finger at least 4 times a day, warm cup of water and holding finger into the cup for at least 5 to 10 minutes   If areas does not heal, becomes painful, increases in size, if you begin to have fever or chills please return to urgent care for reevaluation of finger   ED Prescriptions    Medication Sig Dispense Auth. Provider   doxycycline (VIBRAMYCIN) 100 MG capsule Take 1 capsule (100 mg total) by mouth 2 (two) times daily. 14 capsule Lewi Drost, Leitha Schuller, NP     PDMP not reviewed this encounter.   Hans Eden, NP 06/23/20 1120

## 2020-06-23 NOTE — Discharge Instructions (Addendum)
Take antibiotic twice a day for the next 7 days  Warm soaks to finger at least 4 times a day, warm cup of water and holding finger into the cup for at least 5 to 10 minutes   If areas does not heal, becomes painful, increases in size, if you begin to have fever or chills please return to urgent care for reevaluation of finger

## 2020-06-23 NOTE — ED Triage Notes (Addendum)
Pt c/o swelling to left thumb near thumb nail for a month with malodorous purulent drainage since Saturday morning.   Unable to update pt medication list as pt states there have been some med changes but is uncertain what was changed. Pt does not have her med list with her.

## 2020-06-23 NOTE — Telephone Encounter (Cosign Needed)
Patient called and discussed with Katie RN, doxycycline pills are too large, requesting liquid medication. Prescription changed to liquid form and sent to pharmacy.

## 2020-11-29 ENCOUNTER — Emergency Department (HOSPITAL_COMMUNITY): Payer: MEDICARE

## 2020-11-29 ENCOUNTER — Encounter (HOSPITAL_COMMUNITY): Payer: Self-pay | Admitting: *Deleted

## 2020-11-29 ENCOUNTER — Emergency Department (HOSPITAL_COMMUNITY)
Admission: EM | Admit: 2020-11-29 | Discharge: 2020-11-30 | Disposition: A | Discharge status: Home / Self Care | Payer: MEDICARE | Attending: Emergency Medicine | Admitting: Emergency Medicine

## 2020-11-29 ENCOUNTER — Other Ambulatory Visit: Payer: Self-pay

## 2020-11-29 DIAGNOSIS — Z85028 Personal history of other malignant neoplasm of stomach: Secondary | ICD-10-CM | POA: Diagnosis not present

## 2020-11-29 DIAGNOSIS — Z79899 Other long term (current) drug therapy: Secondary | ICD-10-CM | POA: Insufficient documentation

## 2020-11-29 DIAGNOSIS — I4891 Unspecified atrial fibrillation: Secondary | ICD-10-CM | POA: Diagnosis not present

## 2020-11-29 DIAGNOSIS — N183 Chronic kidney disease, stage 3 unspecified: Secondary | ICD-10-CM | POA: Diagnosis not present

## 2020-11-29 DIAGNOSIS — Z7901 Long term (current) use of anticoagulants: Secondary | ICD-10-CM | POA: Insufficient documentation

## 2020-11-29 DIAGNOSIS — R42 Dizziness and giddiness: Secondary | ICD-10-CM | POA: Diagnosis present

## 2020-11-29 DIAGNOSIS — I129 Hypertensive chronic kidney disease with stage 1 through stage 4 chronic kidney disease, or unspecified chronic kidney disease: Secondary | ICD-10-CM | POA: Insufficient documentation

## 2020-11-29 DIAGNOSIS — R030 Elevated blood-pressure reading, without diagnosis of hypertension: Secondary | ICD-10-CM

## 2020-11-29 DIAGNOSIS — I2510 Atherosclerotic heart disease of native coronary artery without angina pectoris: Secondary | ICD-10-CM | POA: Insufficient documentation

## 2020-11-29 LAB — CBC WITH DIFFERENTIAL/PLATELET
Abs Immature Granulocytes: 0.02 10*3/uL (ref 0.00–0.07)
Basophils Absolute: 0.1 10*3/uL (ref 0.0–0.1)
Basophils Relative: 1 %
Eosinophils Absolute: 0.1 10*3/uL (ref 0.0–0.5)
Eosinophils Relative: 2 %
HCT: 37.9 % (ref 36.0–46.0)
Hemoglobin: 12 g/dL (ref 12.0–15.0)
Immature Granulocytes: 0 %
Lymphocytes Relative: 26 %
Lymphs Abs: 1.5 10*3/uL (ref 0.7–4.0)
MCH: 29.3 pg (ref 26.0–34.0)
MCHC: 31.7 g/dL (ref 30.0–36.0)
MCV: 92.4 fL (ref 80.0–100.0)
Monocytes Absolute: 0.6 10*3/uL (ref 0.1–1.0)
Monocytes Relative: 11 %
Neutro Abs: 3.4 10*3/uL (ref 1.7–7.7)
Neutrophils Relative %: 60 %
Platelets: 234 10*3/uL (ref 150–400)
RBC: 4.1 MIL/uL (ref 3.87–5.11)
RDW: 14.3 % (ref 11.5–15.5)
WBC: 5.7 10*3/uL (ref 4.0–10.5)
nRBC: 0 % (ref 0.0–0.2)

## 2020-11-29 LAB — URINALYSIS, ROUTINE W REFLEX MICROSCOPIC
Bilirubin Urine: NEGATIVE
Glucose, UA: NEGATIVE mg/dL
Hgb urine dipstick: NEGATIVE
Ketones, ur: NEGATIVE mg/dL
Leukocytes,Ua: NEGATIVE
Nitrite: NEGATIVE
Protein, ur: NEGATIVE mg/dL
Specific Gravity, Urine: 1.008 (ref 1.005–1.030)
pH: 7 (ref 5.0–8.0)

## 2020-11-29 LAB — COMPREHENSIVE METABOLIC PANEL
ALT: 14 U/L (ref 0–44)
AST: 22 U/L (ref 15–41)
Albumin: 3.7 g/dL (ref 3.5–5.0)
Alkaline Phosphatase: 96 U/L (ref 38–126)
Anion gap: 11 (ref 5–15)
BUN: 15 mg/dL (ref 8–23)
CO2: 25 mmol/L (ref 22–32)
Calcium: 9.9 mg/dL (ref 8.9–10.3)
Chloride: 101 mmol/L (ref 98–111)
Creatinine, Ser: 1.23 mg/dL — ABNORMAL HIGH (ref 0.44–1.00)
GFR, Estimated: 43 mL/min — ABNORMAL LOW (ref >60)
Glucose, Bld: 121 mg/dL — ABNORMAL HIGH (ref 70–99)
Potassium: 4.1 mmol/L (ref 3.5–5.1)
Sodium: 137 mmol/L (ref 135–145)
Total Bilirubin: 0.6 mg/dL (ref 0.3–1.2)
Total Protein: 6.6 g/dL (ref 6.5–8.1)

## 2020-11-29 LAB — TROPONIN I (HIGH SENSITIVITY): Troponin I (High Sensitivity): 13 ng/L (ref <18)

## 2020-11-29 NOTE — ED Triage Notes (Signed)
Pt states  at 1730 she felt dizzy and took her bp - stated 218/70.  States took all her bp meds today. Stroke exam neg in triage.

## 2020-11-29 NOTE — ED Provider Notes (Addendum)
[facility] EMERGENCY DEPARTMENT Provider Note   CSN: [id number] Arrival date & time: 11/29/20  1912     History Chief Complaint  Patient presents with   Dizziness    Jodi [name] is a 87 y.o. female.  The history is provided by the patient and medical records. No language interpreter was used.  Dizziness Quality:  Lightheadedness Severity:  Moderate Onset quality:  Gradual Timing:  Rare Progression:  Resolved Chronicity:  New Context: not with loss of consciousness   Relieved by:  Nothing Worsened by:  Nothing Ineffective treatments:  None tried Associated symptoms: no blood in stool, no chest pain, no diarrhea, no headaches, no nausea, no palpitations, no shortness of breath, no syncope and no vomiting   Risk factors: hx of stroke       Past Medical History:  Diagnosis Date   Atrial fibrillation (HCC)    CAD (coronary artery disease)    CKD (chronic kidney disease) stage 3, GFR 30-59 ml/min (HCC)    CVA (cerebral vascular accident) ([place])    HLD (hyperlipidemia)    HTN (hypertension)    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[facility])     Patient Active Problem List   Diagnosis Date Noted   Benign essential HTN    Labile blood pressure    Acute blood loss anemia    Constipation    Hypoalbuminemia due to protein-calorie malnutrition (HCC)    Atrial fibrillation ([place]) 08/07/2019   History of embolic stroke 09/98/3382   Essential hypertension 08/07/2019   Hyperlipidemia 08/07/2019   CKD (chronic kidney disease), stage IIIb 08/07/2019   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     Family History  Problem Relation Age of Onset   Prostate cancer Father     Social History   Tobacco Use   Smoking status: Never   Smokeless tobacco: Never  Substance Use Topics   Alcohol use: Not Currently   Drug use: Not Currently    Home Medications Prior to Admission medications   Medication Sig Start Date End Date Taking? Authorizing Provider  acetaminophen (TYLENOL) 325 MG tablet Take 2 tablets (650 mg total) by mouth every 4 (four) hours as needed for mild pain (or temp > 37.5 C (99.5 F)). 08/09/19   Angiulli, Lavon Paganini, PA-C  albuterol (VENTOLIN HFA) 108 (90 Base) MCG/ACT inhaler Inhale 2 puffs into the lungs every 4 (four) hours as needed for wheezing or shortness of breath. 08/09/19   Angiulli, Lavon Paganini, PA-C  apixaban (ELIQUIS) 5 MG TABS tablet Take 1 tablet (5 mg total) by mouth 2 (two) times daily. 08/09/19   Angiulli, Lavon Paganini, PA-C  atorvastatin (LIPITOR) 40 MG tablet Take 1 tablet (40 mg total) by mouth at bedtime. 08/09/19   Angiulli, Lavon Paganini, PA-C  Cholecalciferol (VITAMIN D) 50 MCG (2000 UT) CAPS Take 1 capsule (2,000 Units total) by mouth daily. 08/09/19   Angiulli, Lavon Paganini, PA-C  doxycycline (VIBRAMYCIN) 100 MG capsule Take 1 capsule (100 mg total) by mouth 2 (two) times daily. 06/23/20   White, Leitha Schuller, NP  furosemide (LASIX) 20 MG tablet Take 1 tablet (20 mg total) by mouth daily. 08/09/19   Angiulli, Lavon Paganini, PA-C  metoprolol tartrate (LOPRESSOR) 25 MG tablet Take 1 tablet (25 mg total) by mouth 2 (two) times daily. 08/09/19   Angiulli, Lavon Paganini, PA-C  polyethylene glycol (MIRALAX / GLYCOLAX) 17 g packet Take 17 g by mouth daily. 08/08/19   Donzetta Starch, NP  senna (SENOKOT) 8.6 MG tablet Take 1 tablet by mouth daily as needed for constipation.    [provider]  verapamil (CALAN)  80 MG tablet Take 2 tablets (160 mg total) by mouth 2 (two) times daily. 08/09/19   Angiulli, Lavon Paganini, PA-C    Allergies    Aciphex [rabeprazole], Aspirin, Hydralazine, and Morphine and related  Review of Systems   Review of Systems  Constitutional:  Negative for chills and fever.  HENT:  Negative for ear pain and sore throat.   Eyes:  Negative for pain and visual disturbance.  Respiratory:  Negative for cough and shortness of breath.   Cardiovascular:  Negative for chest pain, palpitations and syncope.  Gastrointestinal:  Negative for abdominal pain, blood in stool, diarrhea, nausea and vomiting.  Genitourinary:  Negative for dysuria and hematuria.  Musculoskeletal:  Negative for arthralgias and back pain.  Skin:  Negative for color change and rash.  Neurological:  Positive for dizziness. Negative for seizures, syncope and headaches.  All other systems reviewed and are negative.  Physical Exam Updated Vital Signs BP (!) 165/61   Pulse (!) 58   Temp 99 F (37.2 C) (Oral)   Resp 16   SpO2 99%   Physical Exam Vitals and nursing note reviewed.  Constitutional:      General: She is not in acute distress.    Appearance: She is well-developed. She is not ill-appearing, toxic-appearing or diaphoretic.  HENT:     Head: Normocephalic and atraumatic.     Nose: No congestion or rhinorrhea.     Mouth/Throat:     Mouth: Mucous membranes are moist.     Pharynx: No oropharyngeal exudate or posterior oropharyngeal erythema.  Eyes:     Extraocular Movements: Extraocular movements intact.     Conjunctiva/sclera: Conjunctivae normal.     Pupils: Pupils are equal, round, and reactive to light.  Neck:     Vascular: No carotid bruit.  Cardiovascular:     Rate and Rhythm: Normal rate and regular rhythm.     Pulses: Normal pulses.     Heart sounds: No murmur heard. Pulmonary:     Effort: Pulmonary effort is normal. No respiratory distress.     Breath sounds: Normal breath sounds. No  wheezing, rhonchi or rales.  Chest:     Chest wall: No tenderness.  Abdominal:     General: Abdomen is flat.     Palpations: Abdomen is soft.     Tenderness: There is no abdominal tenderness. There is no right CVA tenderness, left CVA tenderness, guarding or rebound.  Musculoskeletal:        General: No tenderness.     Cervical back: Neck supple.     Right lower leg: No edema.     Left lower leg: No edema.  Skin:    General: Skin is warm and dry.     Findings: No erythema.  Neurological:     General: No  focal deficit present.     Mental Status: She is alert.     Sensory: No sensory deficit.     Motor: No weakness.  Psychiatric:        Mood and Affect: Mood normal.    ED Results / Procedures / Treatments   Labs (all labs ordered are listed, but only abnormal results are displayed) Labs Reviewed  COMPREHENSIVE METABOLIC PANEL - Abnormal; Notable for the following components:      Result Value   Glucose, Bld 121 (*)    Creatinine, Ser 1.23 (*)    GFR, Estimated 43 (*)    All other components within normal limits  URINALYSIS, ROUTINE W REFLEX MICROSCOPIC - Abnormal; Notable for the following components:   Color, Urine STRAW (*)    All other components within normal limits  CBC WITH DIFFERENTIAL/PLATELET  TROPONIN I (HIGH SENSITIVITY)  TROPONIN I (HIGH SENSITIVITY)    EKG EKG Interpretation  Date/Time:  Saturday November 29 2020 19:27:53 EDT Ventricular Rate:  70 PR Interval:  340 QRS Duration: 82 QT Interval:  396 QTC Calculation: 427 R Axis:   -56 Text Interpretation: Sinus rhythm with 1st degree A-V block with Premature atrial complexes Left anterior fascicular block Cannot rule out Anterior infarct , age undetermined Abnormal ECG When compared to prior, similar appearance with prolonged PR. No STEMI Confirmed by Antony Blackbird [phone]) on 11/29/2020 10:29:42 PM  Radiology CT Head Wo Contrast  Result Date: 11/29/2020 CLINICAL DATA:  Stroke, fall EXAM: CT HEAD  WITHOUT CONTRAST TECHNIQUE: Contiguous axial images were obtained from the base of the skull through the vertex without intravenous contrast. COMPARISON:  08/02/2019 FINDINGS: Brain: There is atrophy and chronic small vessel disease changes. No acute intracranial abnormality. Specifically, no hemorrhage, hydrocephalus, mass lesion, acute infarction, or significant intracranial injury. Vascular: No hyperdense vessel or unexpected calcification. Skull: No acute calvarial abnormality. Sinuses/Orbits: No acute findings Other: None IMPRESSION: Atrophy, chronic microvascular disease. No acute intracranial abnormality. Electronically Signed   By: Rolm Baptise M.D.   On: 11/29/2020 20:38    Procedures Procedures   Medications Ordered in ED Medications - No data to display  ED Course  I have reviewed the triage vital signs and the nursing notes.  Pertinent labs & imaging results that were available during my care of the patient were reviewed by me and considered in my medical decision making (see chart for details).    MDM Rules/Calculators/A&P                           Alexandrea Westergard is a 87 y.o. female with a past medical history significant for atrial fibrillation on chronic Eliquis therapy, CAD, hypertension, hyperlipidemia, and previous stroke who presents for lightheadedness/near syncope and elevated blood pressures.  Patient reports that she has been feeling well with no complaints until this evening when she had lightheadedness.  She reports that her blood pressure was 230 at home and she was feeling very lightheaded like she might pass out.  She denies any palpitations, chest pain, shortness of breath.  She denied any headache, bruising dizziness, numbness, tingling, weakness, or pain.  She says that she has not had any other recent medication changes and was just concerned about the blood pressure and  lightheadedness given her previous stroke.  She denies any new medications, supplements, or  other complaint.  On exam, lungs clear and chest nontender.  Abdomen nontender.  Patient moving all extremities.  Normal extraocular movements  and symmetric pupils.  Symmetric smile.  Clear speech.  Patient resting company.  Blood pressure has improved into the 160s during my initial evaluation and family reports they gave her her blood pressure medicine just before arrival.  Patient is now asymptomatic.  Clinically given her history of stroke it is reasonable to get the labs and a CT head.  CT head was reassuring.  Initial labs reassuring.  We will get a delta troponin as she felt she was near syncopal in the setting of this elevated blood pressure and if this is reassuring, anticipate discharge home.  Patient agrees and will continue her home blood pressure medicine and will follow-up with PCP if delta troponin is reassuring.  Anticipate discharge shortly.     12:15 AM Second troponin returned just at the top range of normal at 20 from 13.  I had a discussion with the patient that likely rose slightly due to the elevated pressures but as she has been symptomatic and has no symptoms now with an improving blood pressure, she agrees with discharge home.  She will follow-up with her primary doctor to discuss blood pressure medication titration and stay hydrated.  She no other questions or concerns and was discharged in good condition with understanding return precautions   Final Clinical Impression(s) / ED Diagnoses Final diagnoses:  Lightheaded  Elevated blood pressure reading      Clinical Impression: 1. Lightheaded   2. Elevated blood pressure reading     Disposition: Discharge  Condition: Good  I have discussed the results, Dx and Tx plan with the pt(& family if present). He/she/they expressed understanding and agree(s) with the plan. Discharge instructions discussed at great length. Strict return precautions discussed and pt &/or family have verbalized understanding of the  instructions. No further questions at time of discharge.    New Prescriptions   No medications on file    Follow Up: No follow-up provider specified.     Taneka Espiritu, Gwenyth Allegra, MD 11/30/20 0011    Hancel Ion, Gwenyth Allegra, MD 11/30/20 [phone]

## 2020-11-29 NOTE — ED Provider Notes (Signed)
Emergency Medicine Provider Triage Evaluation Note  Jodi [name] , a 87 y.o. female  was evaluated in triage.  Pt complains of feeling lightheaded earlier today and checked her BP - 218/70. Daughter reported that pt had 2 strokes last year 1 week apart and was concerned due to elevated BP and brought her into the ED. She reports last year pt was acting off from her baseline prior to being diagnosed with stroke. Pt is at baseline currently for daughter. Pt denies any unilateral weakness, numbness, speech changes, confusion, headache. Took BP meds this morning. Due at 8 PM for same.   Review of Systems  Positive: + lightheadedness, elevated BP Negative: - unilateral weakness or numbness  Physical Exam  BP (!) 203/80 (BP Location: Right Arm)   Pulse 71   Temp 99 F (37.2 C) (Oral)   Resp 12   SpO2 100%  Gen:   Awake, no distress   Resp:  Normal effort  MSK:   Moves extremities without difficulty  Other:  Neuro intact. CN intact. No pronator drift. Normal finger to nose and heel to shin.   Medical Decision Making  Medically screening exam initiated at 7:45 PM.  Appropriate orders placed.  [name] was informed that the remainder of the evaluation will be completed by another provider, this initial triage assessment does not replace that evaluation, and the importance of remaining in the ED until their evaluation is complete.     [name], PA-C 11/29/20 1948    Davonna Belling, MD 11/29/20 2046

## 2020-11-29 NOTE — ED Provider Notes (Signed)
  Provider Note MRN:  [id number]  Arrival date & time: 11/29/20    ED Course and Medical Decision Making  Assumed care from Dr. Sherry Ruffing at shift change.  Lightheadedness, hypertension, reassuring exam and work-up, awaiting second troponin, plan is for discharge if negative.  Troponin on repeat is 20 compared to initial value of 12, largely flat given the high sensitivity and standard deviation, patient is appropriate for discharge.  Procedures  Final Clinical Impressions(s) / ED Diagnoses     ICD-10-CM   1. Lightheaded  R42     2. Elevated blood pressure reading  R03.0       ED Discharge Orders     None       Discharge Instructions   None     Barth Kirks. Sedonia Small, [place] [email or web address]    Maudie Flakes, MD 11/30/20 [phone]

## 2020-11-30 LAB — TROPONIN I (HIGH SENSITIVITY): Troponin I (High Sensitivity): 20 ng/L — ABNORMAL HIGH (ref <18)

## 2020-11-30 NOTE — Discharge Instructions (Addendum)
You were evaluated in the Emergency Department and after careful evaluation, we did not find any emergent condition requiring admission or further testing in the hospital.  Your exam/testing today was overall reassuring.  Recommend follow-up with your primary care doctor to discuss her symptoms and her blood pressure.  Please return to the Emergency Department if you experience any worsening of your condition.  Thank you for allowing [place] to be a part of your care.

## 2020-12-06 ENCOUNTER — Encounter (HOSPITAL_COMMUNITY): Payer: Self-pay | Admitting: Family Medicine

## 2020-12-06 ENCOUNTER — Emergency Department (HOSPITAL_COMMUNITY): Payer: MEDICARE

## 2020-12-06 ENCOUNTER — Observation Stay (HOSPITAL_COMMUNITY)
Admission: EM | Admit: 2020-12-06 | Discharge: 2020-12-07 | Disposition: A | Discharge status: Home / Self Care | Payer: MEDICARE | Attending: Family Medicine | Admitting: Family Medicine

## 2020-12-06 DIAGNOSIS — N1832 Chronic kidney disease, stage 3b: Secondary | ICD-10-CM | POA: Diagnosis not present

## 2020-12-06 DIAGNOSIS — Z79899 Other long term (current) drug therapy: Secondary | ICD-10-CM | POA: Diagnosis not present

## 2020-12-06 DIAGNOSIS — Z85028 Personal history of other malignant neoplasm of stomach: Secondary | ICD-10-CM | POA: Insufficient documentation

## 2020-12-06 DIAGNOSIS — I2510 Atherosclerotic heart disease of native coronary artery without angina pectoris: Secondary | ICD-10-CM | POA: Insufficient documentation

## 2020-12-06 DIAGNOSIS — I160 Hypertensive urgency: Secondary | ICD-10-CM

## 2020-12-06 DIAGNOSIS — R42 Dizziness and giddiness: Secondary | ICD-10-CM | POA: Diagnosis present

## 2020-12-06 DIAGNOSIS — Z8673 Personal history of transient ischemic attack (TIA), and cerebral infarction without residual deficits: Secondary | ICD-10-CM | POA: Insufficient documentation

## 2020-12-06 DIAGNOSIS — Z20822 Contact with and (suspected) exposure to covid-19: Secondary | ICD-10-CM | POA: Insufficient documentation

## 2020-12-06 DIAGNOSIS — Z7901 Long term (current) use of anticoagulants: Secondary | ICD-10-CM | POA: Insufficient documentation

## 2020-12-06 DIAGNOSIS — K649 Unspecified hemorrhoids: Secondary | ICD-10-CM

## 2020-12-06 DIAGNOSIS — D62 Acute posthemorrhagic anemia: Secondary | ICD-10-CM | POA: Diagnosis present

## 2020-12-06 DIAGNOSIS — I129 Hypertensive chronic kidney disease with stage 1 through stage 4 chronic kidney disease, or unspecified chronic kidney disease: Secondary | ICD-10-CM | POA: Diagnosis not present

## 2020-12-06 LAB — CBC
HCT: 39.7 % (ref 36.0–46.0)
Hemoglobin: 12.2 g/dL (ref 12.0–15.0)
MCH: 28.8 pg (ref 26.0–34.0)
MCHC: 30.7 g/dL (ref 30.0–36.0)
MCV: 93.9 fL (ref 80.0–100.0)
Platelets: 229 10*3/uL (ref 150–400)
RBC: 4.23 MIL/uL (ref 3.87–5.11)
RDW: 14.4 % (ref 11.5–15.5)
WBC: 6 10*3/uL (ref 4.0–10.5)
nRBC: 0 % (ref 0.0–0.2)

## 2020-12-06 LAB — COMPREHENSIVE METABOLIC PANEL
ALT: 13 U/L (ref 0–44)
AST: 22 U/L (ref 15–41)
Albumin: 3.8 g/dL (ref 3.5–5.0)
Alkaline Phosphatase: 88 U/L (ref 38–126)
Anion gap: 8 (ref 5–15)
BUN: 13 mg/dL (ref 8–23)
CO2: 26 mmol/L (ref 22–32)
Calcium: 9.8 mg/dL (ref 8.9–10.3)
Chloride: 103 mmol/L (ref 98–111)
Creatinine, Ser: 1.17 mg/dL — ABNORMAL HIGH (ref 0.44–1.00)
GFR, Estimated: 45 mL/min — ABNORMAL LOW (ref >60)
Glucose, Bld: 115 mg/dL — ABNORMAL HIGH (ref 70–99)
Potassium: 4.1 mmol/L (ref 3.5–5.1)
Sodium: 137 mmol/L (ref 135–145)
Total Bilirubin: 0.7 mg/dL (ref 0.3–1.2)
Total Protein: 6.6 g/dL (ref 6.5–8.1)

## 2020-12-06 LAB — URINALYSIS, ROUTINE W REFLEX MICROSCOPIC
Bacteria, UA: NONE SEEN
Bilirubin Urine: NEGATIVE
Glucose, UA: NEGATIVE mg/dL
Ketones, ur: NEGATIVE mg/dL
Leukocytes,Ua: NEGATIVE
Nitrite: NEGATIVE
Protein, ur: NEGATIVE mg/dL
Specific Gravity, Urine: 1.004 — ABNORMAL LOW (ref 1.005–1.030)
pH: 8 (ref 5.0–8.0)

## 2020-12-06 LAB — RESP PANEL BY RT-PCR (FLU A&B, COVID) ARPGX2
Influenza A by PCR: NEGATIVE
Influenza B by PCR: NEGATIVE
SARS Coronavirus 2 by RT PCR: NEGATIVE

## 2020-12-06 LAB — POC OCCULT BLOOD, ED: Fecal Occult Bld: NEGATIVE

## 2020-12-06 LAB — PROTIME-INR
INR: 1.4 — ABNORMAL HIGH (ref 0.8–1.2)
Prothrombin Time: 17.5 seconds — ABNORMAL HIGH (ref 11.4–15.2)

## 2020-12-06 LAB — CBG MONITORING, ED: Glucose-Capillary: 110 mg/dL — ABNORMAL HIGH (ref 70–99)

## 2020-12-06 IMAGING — MR MR HEAD W/O CM
6 of 10 series · 29 of 48 positions shown · non-contrast
Comparison: Head CT 1 week ago.  MRI [DATE].

CLINICAL DATA: Neuro deficit, acute, stroke suspected. Episode of
dizziness with hypertension.

EXAM:
MRI HEAD WITHOUT CONTRAST
TECHNIQUE: Multiplanar, multiecho pulse sequences of the brain and surrounding
structures were obtained without intravenous contrast.

[Series 2: DWI · axial · 3.0mm · 0.94mm/px · z∈[-107,+33]mm · 9 of 98 slices shown (1 of 2)]
[im 1/98]
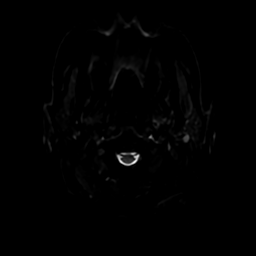
[im 13/98]
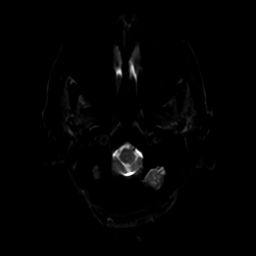
[im 25/98]
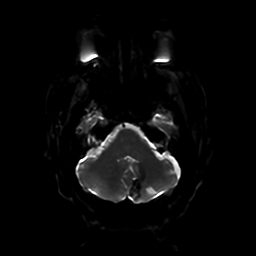
[im 37/98]
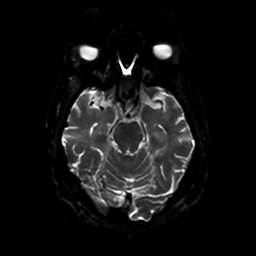
[im 49/98]
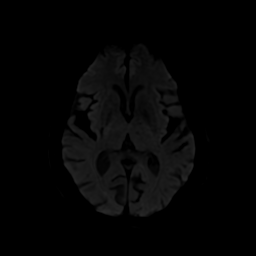
[im 61/98]
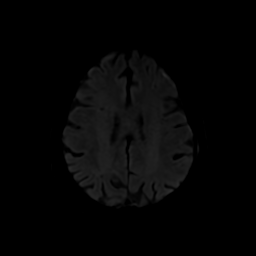
[im 73/98]
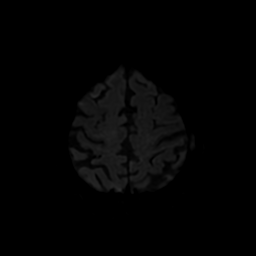
[im 85/98]
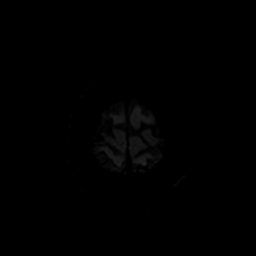
[im 98/98]
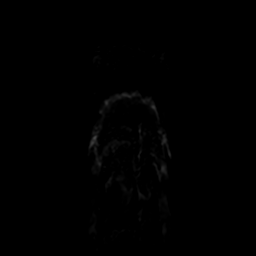

[Series 3: DWI · coronal · 4.0mm · 0.94mm/px · 7 of 73 slices shown (2 of 2)]
[im 1/73]
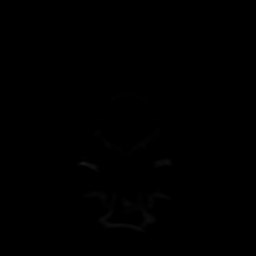
[im 13/73]
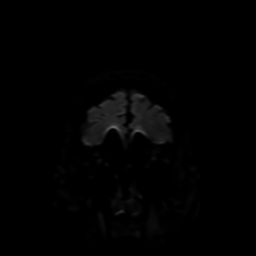
[im 25/73]
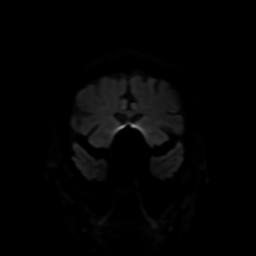
[im 37/73]
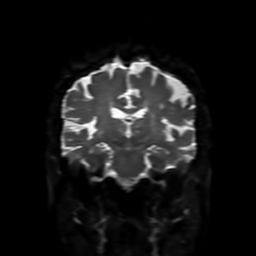
[im 49/73]
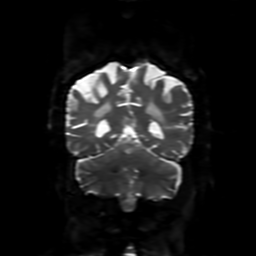
[im 61/73]
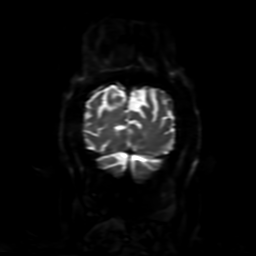
[im 73/73]
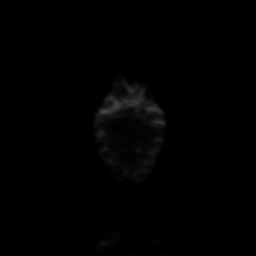

[Series 4: FLAIR · sagittal · 5.0mm · 0.23mm/px · 2 of 23 slices shown (1 of 2)]
[im 1/23]
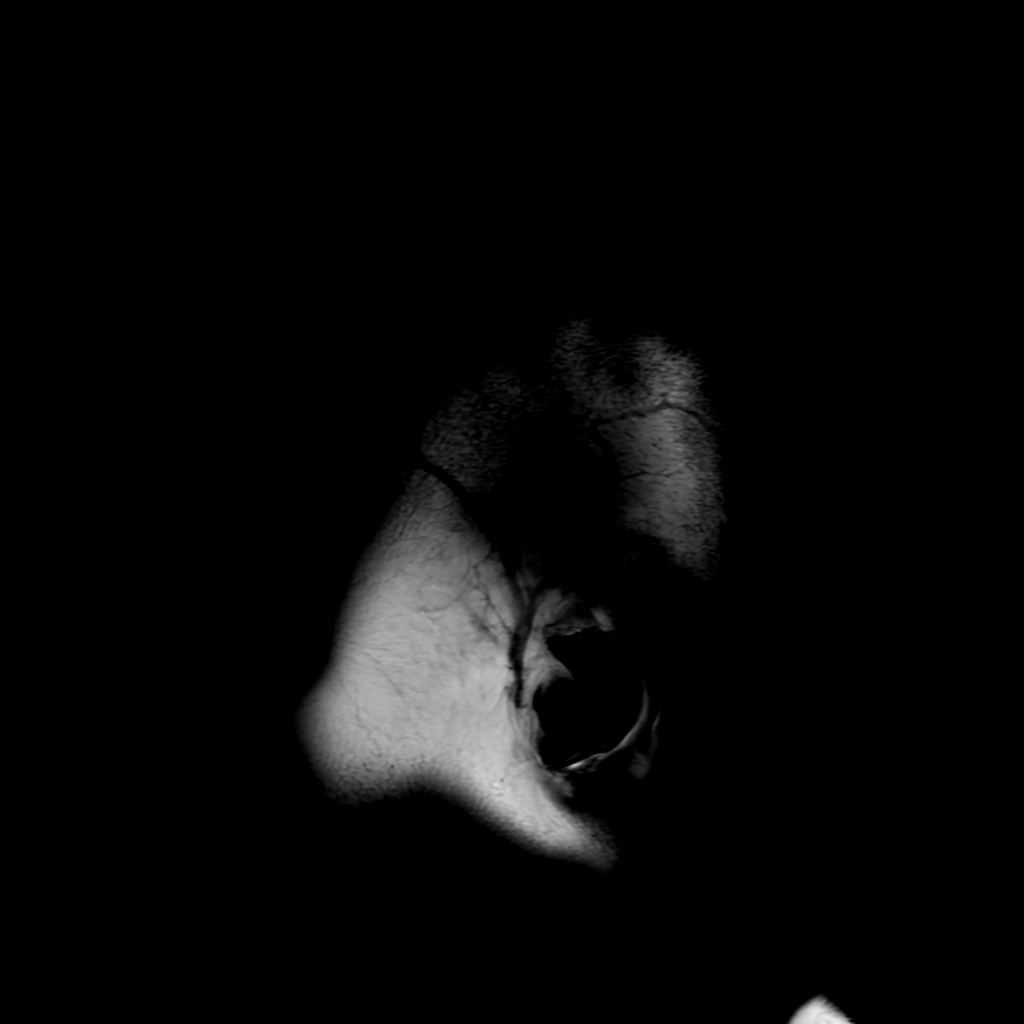
[im 23/23]
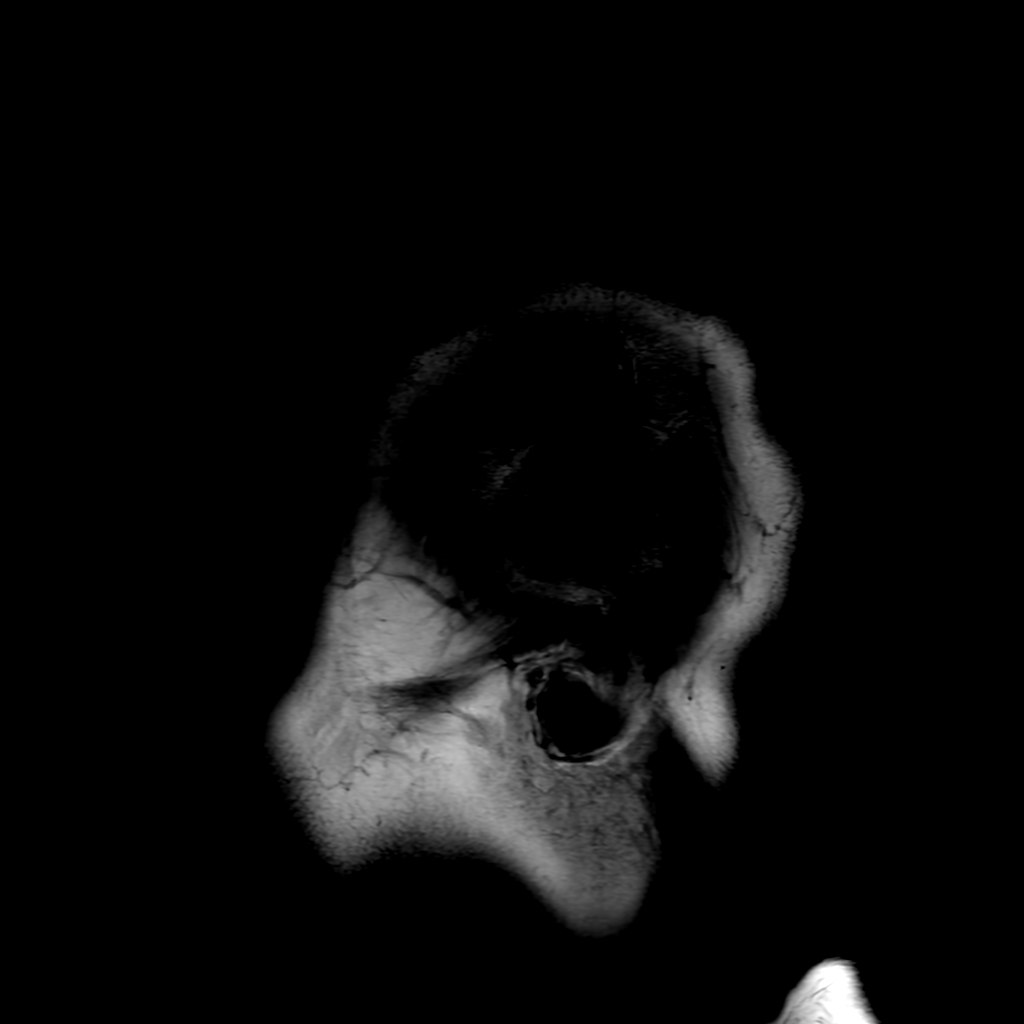

[Series 6: FLAIR · axial · 4.0mm · 0.45mm/px · z∈[-104,+34]mm · 3 of 34 slices shown (2 of 2)]
[im 1/34]
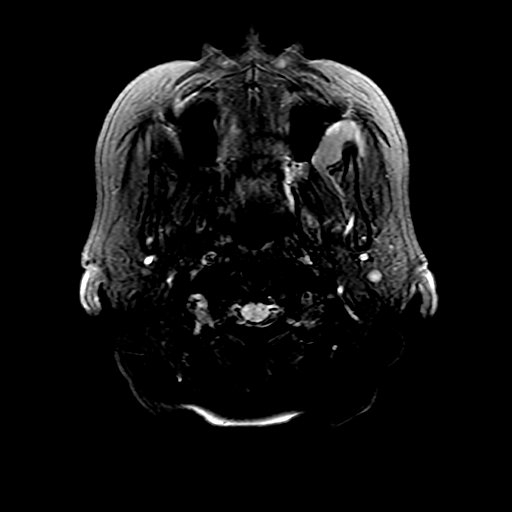
[im 17/34]
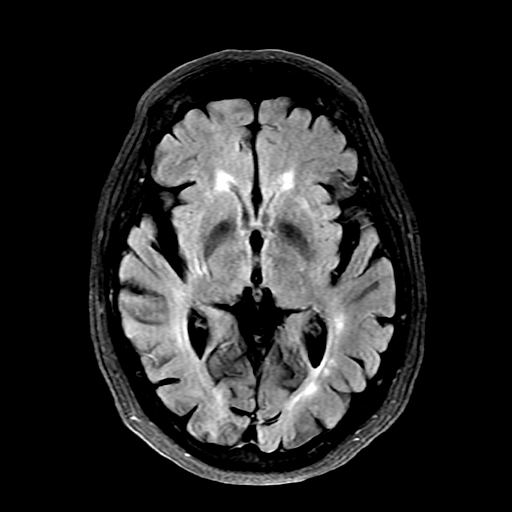
[im 34/34]
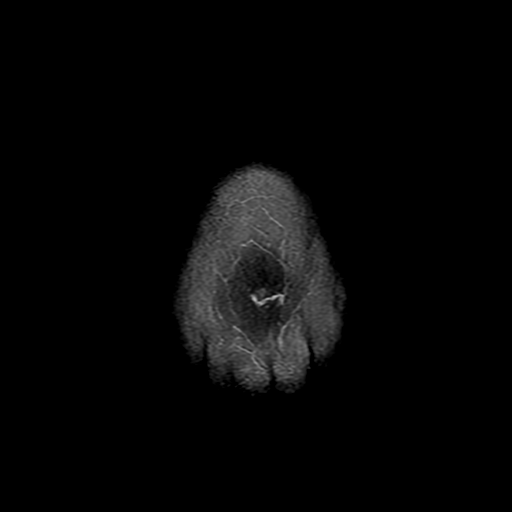

[Series 250: ADC · axial · 3.0mm · 0.94mm/px · z∈[-107,+33]mm · 5 of 50 slices shown (1 of 2)]
[im 1/50]
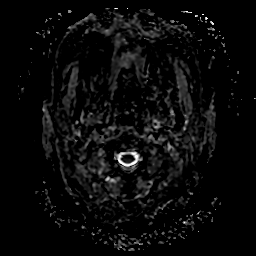
[im 13/50]
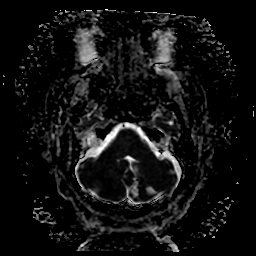
[im 25/50]
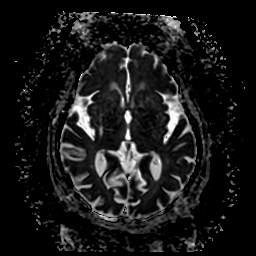
[im 37/50]
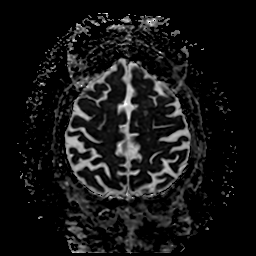
[im 50/50]
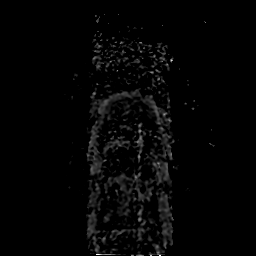

[Series 350: ADC · coronal · 4.0mm · 0.94mm/px · 3 of 36 slices shown (2 of 2)]
[im 1/36]
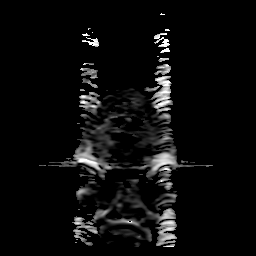
[im 18/36]
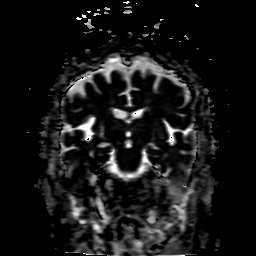
[im 36/36]
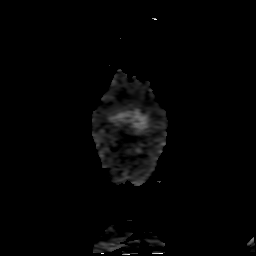

[29 of 48 positions shown; findings below may reference images not displayed]

FINDINGS: Brain: Diffusion imaging does not show any acute or subacute
infarction. Chronic small-vessel ischemic changes affect the pons.
Old small vessel cerebellar infarctions, including an old
hemorrhagic infarction of the medial left cerebellum. Cerebral
hemispheres show moderate to marked changes of chronic small vessel
disease affecting the white matter. No cortical or large vessel
territory infarction. No mass lesion, hydrocephalus or extra-axial
collection.

Vascular: Major vessels at the base of the brain show flow.

Skull and upper cervical spine: Negative

Sinuses/Orbits: Paranasal sinuses are clear. Orbits are negative.
Previous lens implant on the left.

Other: Small right mastoid effusion.
IMPRESSION: No acute intracranial finding. Extensive chronic ischemic changes
throughout the brain as outlined above.

## 2020-12-06 IMAGING — DX DG CHEST 1V PORT
1 series · 1 of 1 positions shown · non-contrast
Comparison: None.

CLINICAL DATA: Hypertension.

EXAM:
PORTABLE CHEST 1 VIEW

[chest]
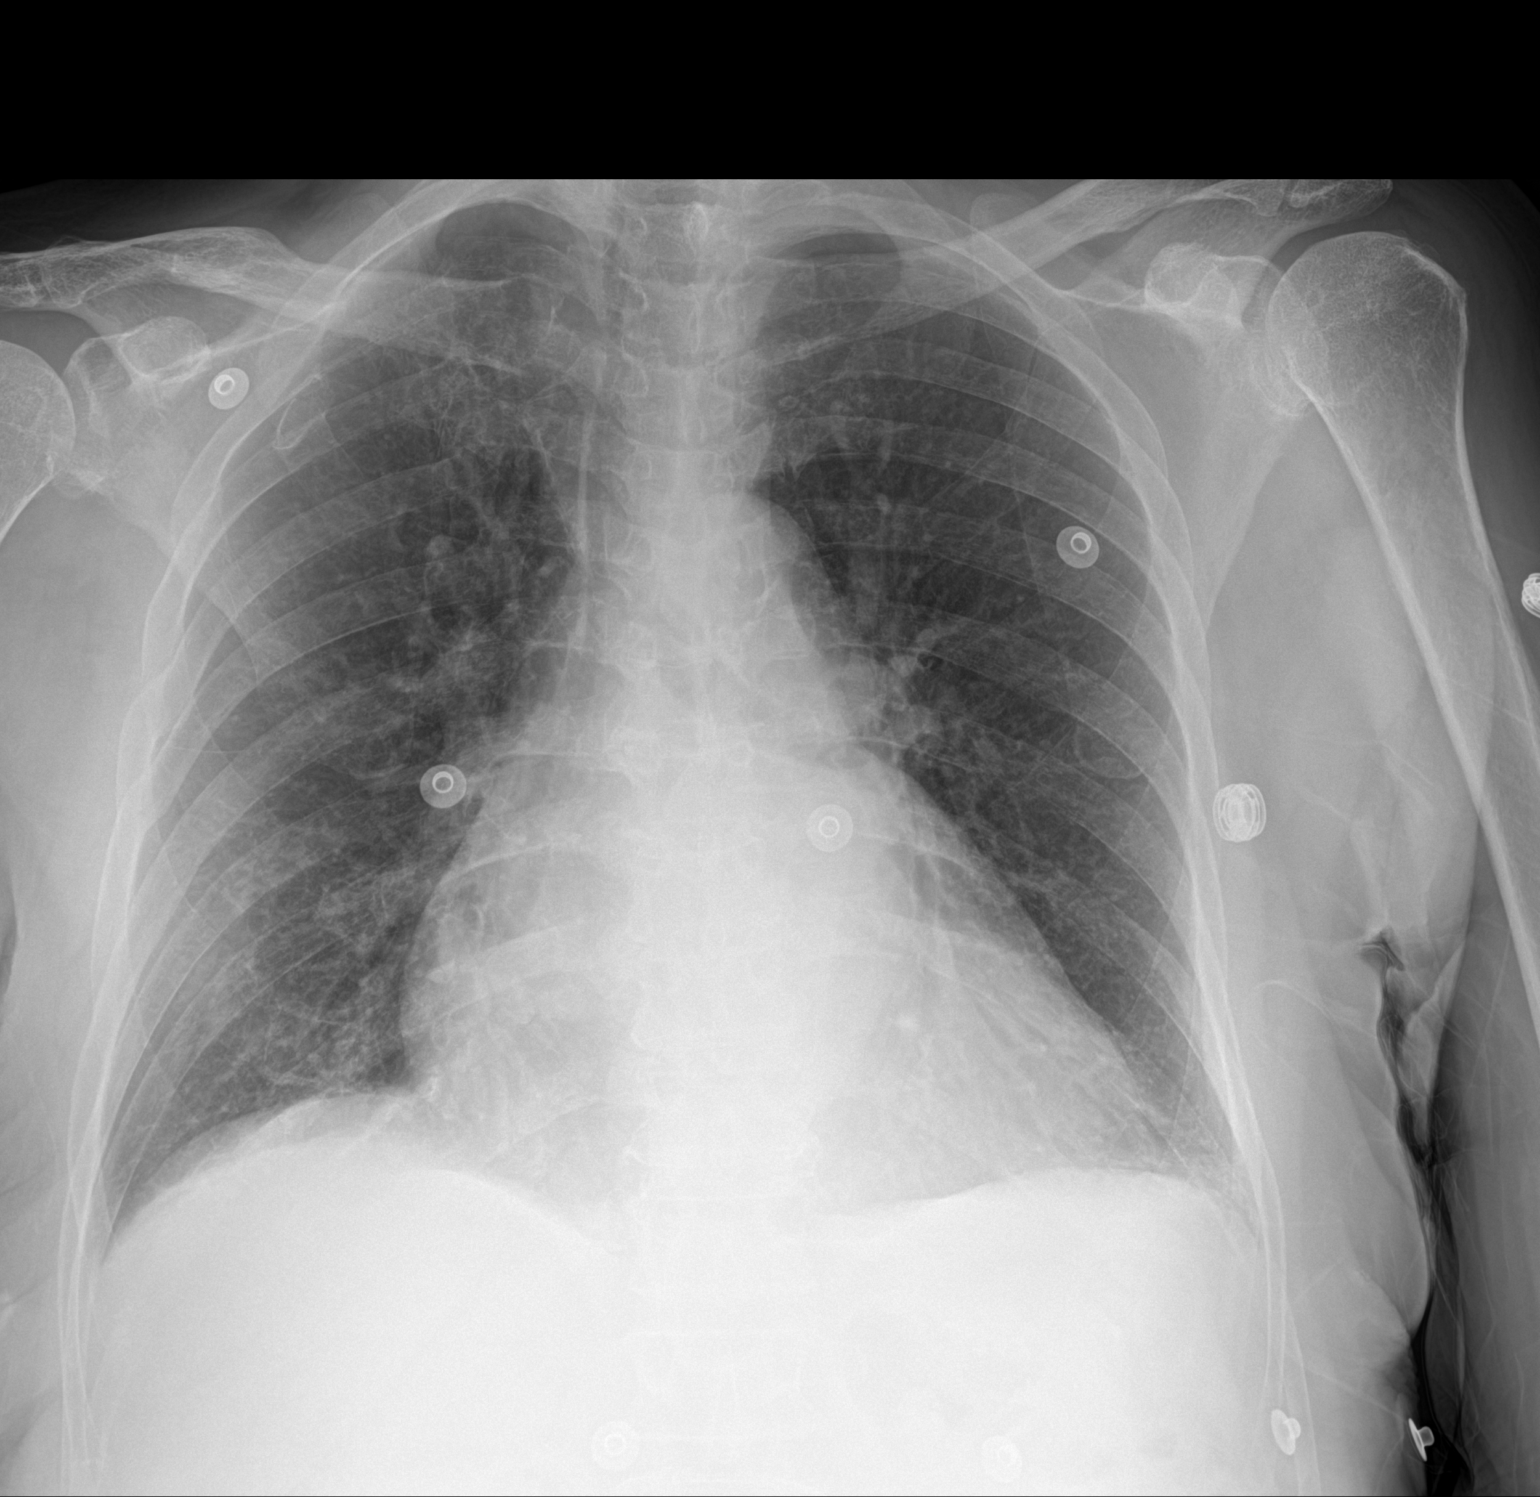

[1 of 1 positions shown; findings below may reference images not displayed]

FINDINGS: Normal cardiomediastinal contours. No pleural effusion or edema
identified. No airspace consolidation identified. Right first,
third, fourth, and fifth rib deformities are identified likely
sequelae of remote trauma. No acute osseous findings.
IMPRESSION: No acute cardiopulmonary abnormalities.

## 2020-12-06 MED ORDER — VERAPAMIL HCL 80 MG PO TABS
160.0000 mg | ORAL_TABLET | Freq: Two times a day (BID) | ORAL | Status: DC
  Filled 2020-12-06: qty 2

## 2020-12-06 MED ORDER — PHENYLEPHRINE IN HARD FAT 0.25 % RE SUPP
1.0000 | Freq: Two times a day (BID) | RECTAL | Status: DC | PRN
  Administered 2020-12-06: 1 via RECTAL
  Filled 2020-12-06 (×2): qty 1

## 2020-12-06 MED ORDER — SODIUM CHLORIDE 0.9% FLUSH: 3.0000 mL | INTRAVENOUS | Status: DC | PRN

## 2020-12-06 MED ORDER — LABETALOL HCL 5 MG/ML IV SOLN
10.0000 mg | Freq: Once | INTRAVENOUS | Status: AC
  Administered 2020-12-06: 10 mg via INTRAVENOUS
  Filled 2020-12-06: qty 4

## 2020-12-06 MED ORDER — HYDRALAZINE HCL 20 MG/ML IJ SOLN
10.0000 mg | Freq: Once | INTRAMUSCULAR | Status: AC
  Administered 2020-12-06: 10 mg via INTRAVENOUS
  Filled 2020-12-06: qty 1

## 2020-12-06 MED ORDER — CARVEDILOL 12.5 MG PO TABS
12.5000 mg | ORAL_TABLET | Freq: Two times a day (BID) | ORAL | Status: DC
  Administered 2020-12-06 – 2020-12-07 (×2): 12.5 mg via ORAL
  Filled 2020-12-06 (×2): qty 1

## 2020-12-06 MED ORDER — APIXABAN 5 MG PO TABS
5.0000 mg | ORAL_TABLET | Freq: Two times a day (BID) | ORAL | Status: DC
  Administered 2020-12-06 – 2020-12-07 (×2): 5 mg via ORAL
  Filled 2020-12-06 (×2): qty 1

## 2020-12-06 MED ORDER — SODIUM CHLORIDE 0.9 % IV SOLN: 250.0000 mL | INTRAVENOUS | Status: DC | PRN

## 2020-12-06 MED ORDER — SODIUM CHLORIDE 0.9% FLUSH
3.0000 mL | Freq: Two times a day (BID) | INTRAVENOUS | Status: DC
  Administered 2020-12-06 – 2020-12-07 (×2): 3 mL via INTRAVENOUS

## 2020-12-06 MED ORDER — PHENYLEPHRINE-MINERAL OIL-PET 0.25-14-74.9 % RE OINT
1.0000 "application " | TOPICAL_OINTMENT | Freq: Two times a day (BID) | RECTAL | Status: DC | PRN
  Filled 2020-12-06: qty 57

## 2020-12-06 MED ORDER — HYDROCORTISONE ACETATE 25 MG RE SUPP: 25.0000 mg | Freq: Two times a day (BID) | RECTAL | 0 refills | Status: AC

## 2020-12-06 MED ORDER — FUROSEMIDE 20 MG PO TABS
20.0000 mg | ORAL_TABLET | Freq: Every day | ORAL | Status: DC
  Administered 2020-12-07: 20 mg via ORAL
  Filled 2020-12-06: qty 1

## 2020-12-06 MED ORDER — SODIUM CHLORIDE 0.9 % IV BOLUS
1000.0000 mL | Freq: Once | INTRAVENOUS | Status: AC
  Administered 2020-12-06: 1000 mL via INTRAVENOUS

## 2020-12-06 MED ORDER — LISINOPRIL 20 MG PO TABS
20.0000 mg | ORAL_TABLET | Freq: Every day | ORAL | Status: DC
  Administered 2020-12-07: 20 mg via ORAL
  Filled 2020-12-06: qty 1

## 2020-12-06 MED ORDER — WITCH HAZEL-GLYCERIN EX PADS
MEDICATED_PAD | CUTANEOUS | Status: DC | PRN
  Filled 2020-12-06: qty 100

## 2020-12-06 MED ORDER — RENA-VITE PO TABS
1.0000 | ORAL_TABLET | Freq: Every day | ORAL | Status: DC
Start: 2020-12-06 — End: 2020-12-07
  Administered 2020-12-06: 1 via ORAL
  Filled 2020-12-06: qty 1

## 2020-12-06 MED ORDER — VERAPAMIL HCL 40 MG PO TABS
160.0000 mg | ORAL_TABLET | Freq: Two times a day (BID) | ORAL | Status: DC
  Filled 2020-12-06: qty 4

## 2020-12-06 MED ORDER — ALBUTEROL SULFATE (2.5 MG/3ML) 0.083% IN NEBU: 2.5000 mg | INHALATION_SOLUTION | RESPIRATORY_TRACT | Status: DC | PRN

## 2020-12-06 MED ORDER — SENNA 8.6 MG PO TABS
1.0000 | ORAL_TABLET | Freq: Every day | ORAL | Status: DC
  Administered 2020-12-06 – 2020-12-07 (×2): 8.6 mg via ORAL
  Filled 2020-12-06 (×2): qty 1

## 2020-12-06 MED ORDER — POLYETHYLENE GLYCOL 3350 17 G PO PACK
17.0000 g | PACK | Freq: Two times a day (BID) | ORAL | Status: DC
  Administered 2020-12-06 – 2020-12-07 (×2): 17 g via ORAL
  Filled 2020-12-06 (×2): qty 1

## 2020-12-06 MED ORDER — SODIUM CHLORIDE 0.9 % IV SOLN: INTRAVENOUS | Status: DC

## 2020-12-06 MED ORDER — ATORVASTATIN CALCIUM 40 MG PO TABS
40.0000 mg | ORAL_TABLET | Freq: Every day | ORAL | Status: DC
  Administered 2020-12-06: 40 mg via ORAL
  Filled 2020-12-06: qty 1

## 2020-12-06 NOTE — ED Provider Notes (Signed)
[facility] EMERGENCY DEPARTMENT Provider Note   CSN: [id number] Arrival date & time: 12/06/20  7124     History Chief Complaint  Patient presents with   Dizziness    Jodi [name] is a 87 y.o. female.  Pt presents to the ED today with dizziness.  Pt has a hx of htn and bp was elevated at home (sbp 170).  She was here on 10/15 for the same.  Pt said she has been taking her bp meds and has otherwise been feeling well.  Pt also said she has hemorrhoids and has a bloody stool this morning.  She has had prior colonoscopies which have been ok.  Pt said she is feeling a little better now.  She does have a hx of afib and is on Eliquis.      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[facility])     Patient Active Problem List   Diagnosis Date Noted   Benign essential HTN    Labile blood pressure    Acute blood loss anemia    Constipation    Hypoalbuminemia due to protein-calorie malnutrition (HCC)    Atrial fibrillation ([place]) 08/07/2019   History of embolic stroke 58/10/9831   Essential hypertension 08/07/2019   Hyperlipidemia 08/07/2019   CKD (chronic kidney disease), stage IIIb 08/07/2019   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     Family History  Problem Relation Age of Onset   Prostate cancer Father     Social History   Tobacco Use   Smoking status: Never   Smokeless tobacco: Never  Substance Use Topics   Alcohol use: Not Currently   Drug use: Not Currently    Home Medications Prior to Admission medications   Medication Sig Start Date End Date Taking? Authorizing Provider  hydrocortisone (ANUSOL-HC) 25 MG suppository Place 1 suppository (25 mg total) rectally 2 (two) times daily. 12/06/20  Yes Isla Pence, MD  acetaminophen (TYLENOL) 325 MG tablet Take 2 tablets (650 mg total) by mouth every 4 (four) hours as needed for mild pain (or temp > 37.5 C (99.5 F)). 08/09/19   Angiulli, Lavon Paganini, PA-C  albuterol (VENTOLIN HFA) 108 (90 Base) MCG/ACT inhaler Inhale 2 puffs into the lungs every 4 (four) hours as needed for wheezing or shortness of breath. 08/09/19   Angiulli, Lavon Paganini, PA-C  apixaban (ELIQUIS) 5 MG TABS tablet Take 1 tablet (5 mg total) by mouth 2 (two) times daily. 08/09/19   Angiulli, Lavon Paganini, PA-C  atorvastatin (LIPITOR) 40 MG tablet Take 1 tablet (40 mg total) by mouth at bedtime. 08/09/19   Angiulli, Lavon Paganini, PA-C  Cholecalciferol (VITAMIN D) 50 MCG (2000 UT) CAPS Take 1 capsule (2,000 Units total) by mouth daily. 08/09/19   Angiulli, Lavon Paganini, PA-C  doxycycline (VIBRAMYCIN) 100 MG capsule Take 1 capsule (100 mg total) by mouth 2 (two) times daily. 06/23/20   White, Leitha Schuller, NP  furosemide (LASIX) 20 MG tablet Take 1 tablet (20 mg total) by mouth daily. 08/09/19   Angiulli, Lavon Paganini, PA-C  metoprolol tartrate (LOPRESSOR) 25 MG tablet Take 1 tablet (25 mg total) by mouth 2 (two) times daily. 08/09/19   Angiulli, Lavon Paganini, PA-C  polyethylene glycol (MIRALAX / GLYCOLAX) 17 g packet Take 17 g by mouth daily. 08/08/19   Donzetta Starch, NP  senna (SENOKOT) 8.6 MG tablet Take 1 tablet by mouth daily as needed for constipation.    [provider]  verapamil (CALAN) 80 MG tablet Take 2 tablets (160 mg total) by mouth 2 (two) times daily. 08/09/19   Angiulli, Lavon Paganini, PA-C    Allergies    Aciphex [rabeprazole], Aspirin, Hydralazine, and Morphine and related  Review of Systems   Review of Systems  Neurological:  Positive for dizziness.  All other systems reviewed and are negative.  Physical Exam Updated Vital Signs BP (!) 195/78   Pulse 76   Temp 98.6 F (37 C) (Oral)   Resp 16   SpO2 99%   Physical Exam Vitals and nursing note reviewed.  Constitutional:      Appearance: Normal appearance.  HENT:     Head: Normocephalic and atraumatic.     Right Ear: External ear normal.     Left Ear: External ear normal.     Nose: Nose normal.     Mouth/Throat:     Mouth: Mucous membranes are moist.     Pharynx: Oropharynx is clear.  Eyes:     Extraocular Movements: Extraocular movements intact.     Conjunctiva/sclera: Conjunctivae normal.     Pupils: Pupils are equal, round, and reactive to light.  Cardiovascular:     Rate and Rhythm: Normal rate and regular rhythm.     Pulses: Normal pulses.     Heart sounds: Normal heart sounds.  Pulmonary:     Effort: Pulmonary effort is normal.     Breath sounds: Normal breath sounds.  Abdominal:     General: Abdomen is flat. Bowel sounds are normal.     Palpations: Abdomen is soft.  Genitourinary:    Rectum: Guaiac result negative. External hemorrhoid present.  Musculoskeletal:        General: Normal range of motion.     Cervical back: Normal range of motion and neck supple.  Skin:    General: Skin is warm.     Capillary Refill: Capillary refill takes less than 2 seconds.  Neurological:     General: No focal deficit present.     Mental Status: She is alert and oriented to person, place, and time.  Psychiatric:        Mood and Affect: Mood normal.        Behavior: Behavior normal.    ED Results / Procedures / Treatments   Labs (all labs ordered are listed, but only  abnormal results are displayed) Labs Reviewed  COMPREHENSIVE METABOLIC PANEL - Abnormal; Notable for the following components:      Result Value   Glucose, Bld 115 (*)    Creatinine, Ser 1.17 (*)    GFR, Estimated 45 (*)    All other components within normal limits  PROTIME-INR -  Abnormal; Notable for the following components:   Prothrombin Time 17.5 (*)    INR 1.4 (*)    All other components within normal limits  CBG MONITORING, ED - Abnormal; Notable for the following components:   Glucose-Capillary 110 (*)    All other components within normal limits  RESP PANEL BY RT-PCR (FLU A&B, COVID) ARPGX2  CBC  URINALYSIS, ROUTINE W REFLEX MICROSCOPIC  POC OCCULT BLOOD, ED    EKG EKG Interpretation  Date/Time:  Saturday December 06 2020 11:00:03 EDT Ventricular Rate:  50 PR Interval:  376 QRS Duration: 86 QT Interval:  430 QTC Calculation: 392 R Axis:   -42 Text Interpretation: Sinus bradycardia with 1st degree A-V block Left axis deviation Possible Anterior infarct , age undetermined Abnormal ECG No significant change since last tracing Confirmed by Isla Pence [phone]) on 12/06/2020 11:31:41 AM  Radiology MR BRAIN WO CONTRAST  Result Date: 12/06/2020 CLINICAL DATA:  Neuro deficit, acute, stroke suspected. Episode of dizziness with hypertension. EXAM: MRI HEAD WITHOUT CONTRAST TECHNIQUE: Multiplanar, multiecho pulse sequences of the brain and surrounding structures were obtained without intravenous contrast. COMPARISON:  Head CT 1 week ago.  MRI 08/03/2019. FINDINGS: Brain: Diffusion imaging does not show any acute or subacute infarction. Chronic small-vessel ischemic changes affect the pons. Old small vessel cerebellar infarctions, including an old hemorrhagic infarction of the medial left cerebellum. Cerebral hemispheres show moderate to marked changes of chronic small vessel disease affecting the white matter. No cortical or large vessel territory infarction. No mass lesion,  hydrocephalus or extra-axial collection. Vascular: Major vessels at the base of the brain show flow. Skull and upper cervical spine: Negative Sinuses/Orbits: Paranasal sinuses are clear. Orbits are negative. Previous lens implant on the left. Other: Small right mastoid effusion. IMPRESSION: No acute intracranial finding. Extensive chronic ischemic changes throughout the brain as outlined above. Electronically Signed   By: Nelson Chimes M.D.   On: 12/06/2020 13:00   DG Chest Port 1 View  Result Date: 12/06/2020 CLINICAL DATA:  Hypertension. EXAM: PORTABLE CHEST 1 VIEW COMPARISON:  None. FINDINGS: Normal cardiomediastinal contours. No pleural effusion or edema identified. No airspace consolidation identified. Right first, third, fourth, and fifth rib deformities are identified likely sequelae of remote trauma. No acute osseous findings. IMPRESSION: No acute cardiopulmonary abnormalities. Electronically Signed   By: Kerby Moors M.D.   On: 12/06/2020 11:47    Procedures Procedures   Medications Ordered in ED Medications  sodium chloride 0.9 % bolus 1,000 mL (0 mLs Intravenous Stopped 12/06/20 1709)    And  0.9 %  sodium chloride infusion ( Intravenous New Bag/Given 12/06/20 1342)  hydrALAZINE (APRESOLINE) injection 10 mg (has no administration in time range)  hydrALAZINE (APRESOLINE) injection 10 mg (10 mg Intravenous Given 12/06/20 1347)  labetalol (NORMODYNE) injection 10 mg (10 mg Intravenous Given 12/06/20 1623)    ED Course  I have reviewed the triage vital signs and the nursing notes.  Pertinent labs & imaging results that were available during my care of the patient were reviewed by me and considered in my medical decision making (see chart for details).    MDM Rules/Calculators/A&P                           Pt is currently taking coreg, lasix, lisinopril, and verapamil for her bp.  At one point she was on hydralazine, but is not taking it now.  She is not sure why.  She has an  allergy of nausea to hydralazine.  However, she does not recall that reaction.  She was given IV hydralazine in the ED and had no problems with nausea.  However, bp is going up instead of down.  Pt given labetalol and bp is still going up.  Pt still feels dizzy.    Pt d/w FP for admission.   Final Clinical Impression(s) / ED Diagnoses Final diagnoses:  Hemorrhoids, unspecified hemorrhoid type  Hypertensive urgency    Rx / DC Orders ED Discharge Orders          Ordered    hydrocortisone (ANUSOL-HC) 25 MG suppository  2 times daily        12/06/20 1631             Isla Pence, MD 12/06/20 1731

## 2020-12-06 NOTE — ED Provider Notes (Signed)
Emergency Medicine Provider Triage Evaluation Note  Jodi [name] , a 87 y.o. female  was evaluated in triage.  Pt complains of an episode of dizziness prior to arrival with hypertension in the 314H systolic.  Additionally, the patient mentions she has hemorrhoids and had a bowel movement prior to arrival and noticed some bright red blood on the tissue and in the toilet. No history of GI bleed. Prior history of two strokes.   Review of Systems  Positive: Dizziness, rectal bleeding Negative: Abdominal pain, nausea, vomiting, fever, chest pain, SOB, weakness  Physical Exam  BP (!) 155/63 (BP Location: Left Arm)   Pulse (!) 51   Temp 98.6 F (37 C) (Oral)   Resp 20   SpO2 99%  Gen:   Awake, no distress   Resp:  Normal effort  MSK:   Moves extremities without difficulty, no focal weakness Other:  Speech normal  Medical Decision Making  Medically screening exam initiated at 10:52 AM.  Appropriate orders placed.  Jodi [name] was informed that the remainder of the evaluation will be completed by another provider, this initial triage assessment does not replace that evaluation, and the importance of remaining in the ED until their evaluation is complete.  Dizziness order set.    Sherrell Puller, PA-C 12/06/20 1056    Jodi Boss, MD 12/07/20 [phone]

## 2020-12-06 NOTE — H&P (Signed)
[facility] Admission History and Physical Service Pager: [phone]  Patient name: Jodi [name] Medical record number: [id number] Date of birth: Oct 02, 1932 Age: 87 y.o. Gender: female  Primary Care Provider: Chase Picket, MD Consultants: None Code Status: Full code Preferred Emergency Contact: Daughter Maryjean Ka, [phone]  Chief Complaint: Dizziness, hemorrhoid bleeding  Assessment and Plan: Jodi [name] is a 87 y.o. female presenting with dizziness, high blood pressure, and bright red blood per rectum. PMH is significant for HTN, A. fib on Eliquis, history CVA x2, CKD stage IIIb, external hemorrhoids, constipation, HLD, adenocarcinoma of uterus, stomach cancer, partial nephrectomy for kidney tumor.   Dizziness and elevated BP  primary hypertension Patient presented to ED from home with complaints of dizziness and high blood pressure.  This concerned her, because she experienced the symptoms with her last stroke.  She measured her systolic blood pressure at home to be in the 170s. Chart review reveals long history of dizziness since stroke. Upon presentation to the ED, BP was found to be 155/63 initially.  CXR and MR brain without contrast unremarkable.  ED EKG taken around 1 pm demonstrated sinus bradycardia at 50 bpm and first degree heart block with PR 376 ms. Orthostatics in the ED were positive, with drop of 22 mmHg from sitting to standing, notably without large compensatory HR increase (only increased from 57 to 67).  Subsequently received 1 L NS bolus then maintenance IV fluids at 125 mL/hour, after which her BP rose to systolics of 185U and 314H with diastolics in the 70Y and 63Z. She was then given hydralazine 10 mg IV x2 and labetalol 10 mg IV x1 without appropriate decrease in blood pressures. Home BP medications carvedilol 12.5 mg twice daily, furosemide 20 mg daily, lisinopril 20 mg daily, verapamil 160 mg twice daily. Patient reports  she gets pill packs from her pharmacy and that she believes there may have been one of her evening medications missing from the pill pack for the last couple of days.  She cannot recall which medicine was missing or what the missing pill would have looked like.  P.m. meds include carvedilol, verapamil, atorvastatin, and Eliquis.  On admission exam closer to 8pm, patient was in A.fib with approximate rate of 75 bpm. 10 pm EKG showed sinus bigeminy at 93 bpm and first degree AV block with improved PR interval of 270 bpm.  Differential diagnosis for worsened hypertension and dizziness includes missed medication at home, new CVA, and carotid stenosis.  New CVA unlikely given MR brain without contrast. - admit for FPTS with Dr. Owens Shark attending - repeat EKG tonight - PT/OT eval and treat - repeat orthostatics in the morning - restart home BP medications of carvedilol, furosemide, lisinopril - home verapamil to restart tomorrow morning (wanted confirmation of improved PR interval prior to administration) - RN to measure BP while patient is standing  - aim to titrate standing diastolic to normal pressure as to avoid orthostatic hypotension and falls at home - DC maintenance IV fluids as patient is eating and drinking near baseline and does not appear dehydrated  A. 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 Not eligible for dose reduction despite her age as her body weight is >60 kg and Cr <1.50.  Though she does have bright red blood per rectum, this is thought to be secondary to external hemorrhoids.  Hemoglobin on admission 12.2, stable from baseline  of 11-12. - continue Eliquis 5 mg twice daily due to high risk of cardio-embolic stroke  CKD stage 3b  Hx partial nephrectomy Creatinine on admission 1.17, consistent with baseline in chart of 1.1-1.2.  We will continue home furosemide 20 mg daily with  attention to creatinine on morning BMP.  Potassium on admission 4.1. She reports history of partial nephrectomy due to kidney infection. Chart in care everywhere indicates she had partial nephrectomy for kidney tumor removal in 2004. She does not believe this tumor was cancerous.  She believes she had HTN prior to this surgery, is not sure if HTN worsened afterwards.  - Continue frusemide 20 mg daily - A.m. BMP for potassium measurement  External hemorrhoids  bright red blood per rectum  history constipation Patient reports history of constipation that aggravates hemorrhoidal bleeding. External hemorrhoids on physical exam in ED. Distant history of stomach cancer (discussed below), chart review reveals a stomach cancer was removed in the mid 1990s; however, given bright red blood color, physical exam, and known hemorrhoids, less likely to be related to history of stomach cancer. - A.m. CBC - Tucks pads as needed, Preparation H as needed - monitor blood loss - MiraLAX twice daily, senna daily  HLD Home meds include atorvastatin 40 mg nightly.  Will order to restart tonight.  History of protein-calorie malnutrition Patient's albumin on admission 3.8.  Last hypoalbuminemia on record was June 2021 of 3.0.  Patient reports she eats very little normally, and has experienced decreased appetite recently. - consult dietician - start renal multivitamin - start Ensure BID  Hx adenocarcinoma of uterus  Per care everywhere chart system, [facility] has noted she underwent exploratory laparotomy, total abdominal hysterectomy, bilateral salpingo-oophorectomy on 12/02/2016. Pathology indicated adenocarcinoma, FIGO stage 1B. No adjunctive therapy. Per [facility], she is undergoing follow up q6 months.   Hx stomach cancer Care everywhere chart review also reports history of stomach cancer.  Not a lot of details, but one note did say this cancer was removed in the mid 1990s.  Patient reports that this was "a few  years ago".  She denies needing follow-up for this at this time.  Not currently under treatment.    FEN/GI: Heart healthy, thin consistency  Prophylaxis: Full dose Eliquis  Disposition: Med-surg  History of Present Illness:  Jodi [name] is a 87 y.o. female presenting from home with complaints of dizziness, high blood pressure, and bright red blood per rectum..  This concerned her, because she experienced the symptoms with her last stroke.  She measured her systolic blood pressure at home to be in the 170s. She also reports bright red blood per rectum that started after a bowel movement.  She has a known history of external hemorrhoids and constipation, she normally bleeds with bowel movements.  However what is different today is that she reports today's bleeding lasted longer than normal.  Upon discussing her dizziness, she also says that she gets dizzy with bowel movements.  After some discussion, it appears that she has also been dizzy at rest during the day today.  She clearly notes though that the dizziness she feels today is less intense and characteristically dissimilar to the dizziness she felt with her last stroke.  Home BP medications carvedilol 12.5 mg twice daily, furosemide 20 mg daily, lisinopril 20 mg daily, verapamil 160 mg twice daily. Patient reports she gets pill packs from her pharmacy and that she believes there may have been one of her evening medications missing from the pill  pack for the last couple of days.  She cannot recall which medicine was missing or what the missing pill would have looked like.  P.m. meds include carvedilol, verapamil, atorvastatin, and Eliquis.  Review Of Systems: Per HPI with the following additions:   Review of Systems  Constitutional:  Positive for appetite change (decreased from baseline, unknown duration). Negative for chills and fatigue.  HENT:  Negative for nosebleeds.   Respiratory:  Positive for shortness of breath (Feels like she cannot  take deep breath in, uncertain duration or pattern). Negative for chest tightness.   Cardiovascular:  Negative for chest pain, palpitations and leg swelling.  Gastrointestinal:  Positive for anal bleeding, blood in stool, constipation and rectal pain.  Skin:  Negative for color change and pallor.  Neurological:  Positive for dizziness. Negative for seizures, syncope, facial asymmetry, weakness, light-headedness, numbness and headaches.    Patient Active Problem List   Diagnosis Date Noted   Dizziness 12/06/2020   Benign essential HTN    Labile blood pressure    Acute blood loss anemia    Constipation    Hypoalbuminemia due to protein-calorie malnutrition [facility])    Atrial fibrillation ([place]) 08/07/2019   History of embolic stroke 78/29/5621   Essential hypertension 08/07/2019   Hyperlipidemia 08/07/2019   CKD (chronic kidney disease), stage IIIb 08/07/2019   Right middle cerebral artery stroke ([place]) 08/07/2019   Acute cardioembolic stroke (HCC) d/t AF, s/p clot retrieval,  08/02/2019    Past Medical History: Past Medical History:  Diagnosis Date   Atrial fibrillation (HCC)    CAD (coronary artery disease)    CKD (chronic kidney disease) stage 3, GFR 30-59 ml/min (HCC)    CVA (cerebral vascular accident) ([place])    HLD (hyperlipidemia)    HTN (hypertension)    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([place])     Past Surgical History: Past Surgical History:  Procedure Laterality Date   IR CT HEAD LTD  08/02/2019   IR PERCUTANEOUS ART THROMBECTOMY/INFUSION INTRACRANIAL INC DIAG ANGIO  08/02/2019       IR PERCUTANEOUS ART THROMBECTOMY/INFUSION INTRACRANIAL INC DIAG ANGIO  08/02/2019   RADIOLOGY WITH ANESTHESIA N/A 08/02/2019   Procedure: IR WITH ANESTHESIA;  Surgeon: Luanne Bras, MD;  Location: [place];  Service: Radiology;  Laterality: N/A;    Social History: Social History   Tobacco Use   Smoking status: Never   Smokeless tobacco: Never  Substance Use Topics   Alcohol use: Not Currently   Drug  use: Not Currently   Additional social history: None Please also refer to relevant sections of EMR.  Family History: Family History  Problem Relation Age of Onset   Prostate cancer Father     Allergies and Medications: Allergies  Allergen Reactions   Aciphex [Rabeprazole]     Hives   Aspirin     Anaphylaxis   Hydralazine     Nausea   Hydroquinone Other (See Comments)   Morphine And Related     Emesis   No current facility-administered medications on file prior to encounter.   Current Outpatient Medications on File Prior to Encounter  Medication Sig Dispense Refill   acetaminophen (TYLENOL) 325 MG tablet Take 2 tablets (650 mg total) by mouth every 4 (four) hours as needed for mild pain (or temp > 37.5 C (99.5 F)).     albuterol (VENTOLIN HFA) 108 (90 Base) MCG/ACT inhaler Inhale 2 puffs into the lungs every 4 (four) hours as needed for wheezing or shortness of breath. (Patient taking differently: Inhale  2 puffs into the lungs every 4 (four) hours as needed for shortness of breath.) 6.7 g 1   apixaban (ELIQUIS) 5 MG TABS tablet Take 1 tablet (5 mg total) by mouth 2 (two) times daily. 60 tablet 1   atorvastatin (LIPITOR) 40 MG tablet Take 1 tablet (40 mg total) by mouth at bedtime. 30 tablet 0   carvedilol (COREG) 12.5 MG tablet Take 12.5 mg by mouth 2 (two) times daily.     Cholecalciferol (VITAMIN D) 50 MCG (2000 UT) CAPS Take 1 capsule (2,000 Units total) by mouth daily. 30 capsule 1   furosemide (LASIX) 20 MG tablet Take 1 tablet (20 mg total) by mouth daily. 30 tablet 1   lisinopril (ZESTRIL) 20 MG tablet Take 20 mg by mouth daily.     verapamil (CALAN) 80 MG tablet Take 2 tablets (160 mg total) by mouth 2 (two) times daily. 60 tablet 0   doxycycline (VIBRAMYCIN) 100 MG capsule Take 1 capsule (100 mg total) by mouth 2 (two) times daily. (Patient not taking: No sig reported) 14 capsule 0   metoprolol tartrate (LOPRESSOR) 25 MG tablet Take 1 tablet (25 mg total) by mouth 2  (two) times daily. (Patient not taking: No sig reported) 60 tablet 1   polyethylene glycol (MIRALAX / GLYCOLAX) 17 g packet Take 17 g by mouth daily. (Patient not taking: No sig reported) 14 each 0    Objective: BP (!) 157/69 (BP Location: Left Arm)   Pulse 98   Temp 98.7 F (37.1 C) (Oral)   Resp 17   SpO2 99%  Exam: General: Awake, alert, no acute distress, dinner tray in front of her Eyes: Sclera anicteric Cardiovascular: irregularly irregular rhythm (c/w known A.fib) without murmur, brisk cap refill, no skin tenting Respiratory: CTAB anteriorly and posteriorly MSK: Gait appears to be at baseline, uses cane to ambulate Derm: No sores, lesions, rashes noted Neuro: Cranial nerves II through X grossly intact, no focal deficits  Labs and Imaging: CBC BMET  Recent Labs  Lab 12/06/20 1104  WBC 6.0  HGB 12.2  HCT 39.7  PLT 229   Recent Labs  Lab 12/06/20 1104  NA 137  K 4.1  CL 103  CO2 26  BUN 13  CREATININE 1.17*  GLUCOSE 115*  CALCIUM 9.8     ED EKG: Sinus bradycardia, rate 50 bpm, first degree heart block with PR 376 ms, narrow QRS  10 pm EKG showed sinus rhythm, 93 bpm, bigeminy pattern, first degree AV block with improved PR interval of 270 bpm.   PORTABLE CHEST 1 VIEW 12/06/2020  COMPARISON:  None. FINDINGS: Normal cardiomediastinal contours. No pleural effusion or edema identified. No airspace consolidation identified. Right first, third, fourth, and fifth rib deformities are identified likely sequelae of remote trauma. No acute osseous findings. IMPRESSION: No acute cardiopulmonary abnormalities.  MRI HEAD WITHOUT CONTRAST 12/06/2020 TECHNIQUE: Multiplanar, multiecho pulse sequences of the brain and surrounding structures were obtained without intravenous contrast. COMPARISON:  Head CT 1 week ago.  MRI 08/03/2019. FINDINGS: Brain: Diffusion imaging does not show any acute or subacute infarction. Chronic small-vessel ischemic changes affect the  pons. Old small vessel cerebellar infarctions, including an old hemorrhagic infarction of the medial left cerebellum. Cerebral hemispheres show moderate to marked changes of chronic small vessel disease affecting the white matter. No cortical or large vessel territory infarction. No mass lesion, hydrocephalus or extra-axial collection. Vascular: Major vessels at the base of the brain show flow. Skull and upper cervical spine: Negative Sinuses/Orbits:  Paranasal sinuses are clear. Orbits are negative. Previous lens implant on the left. Other: Small right mastoid effusion. IMPRESSION: No acute intracranial finding. Extensive chronic ischemic changes throughout the brain as outlined above.     Ezequiel Essex, MD 12/06/2020, 11:25 PM PGY-2, [facility] Intern pager: [phone], text pages welcome

## 2020-12-06 NOTE — ED Notes (Signed)
Called for triage. No answer. 

## 2020-12-06 NOTE — ED Notes (Signed)
Patient transported to MRI 

## 2020-12-06 NOTE — Progress Notes (Signed)
Family medicine teaching service will be admitting this patient. Our pager information can be located in the physician sticky notes, treatment team sticky notes, and the headers of all our official daily progress notes.   FAMILY MEDICINE TEACHING SERVICE Patient - Please contact intern pager [phone] or text page via website [email or web address] (login: [email or web address]) for questions regarding care. DO NOT page listed attending provider unless there is no answer from the number above.   Geraldy Akridge, MD PGY-2, [facility] pager [phone]   

## 2020-12-06 NOTE — ED Notes (Signed)
Report attempted 

## 2020-12-06 NOTE — Discharge Instructions (Addendum)
Dear Jodi [name],   Thank you for letting us participate in your care! In this section, you will find a brief summary of why you were admitted to the hospital, what happened during your admission, your diagnosis/diagnoses, and recommended follow up.  You were admitted because you were experiencing elevated blood pressure as well as dizziness.  Your testing, which included blood work, a chest x-ray, and an MRI of your brain did not show any concerning findings Some of your medications were adjusted You were also seen by physical therapy and occupational therapy while you were here.   POST-HOSPITAL & CARE INSTRUCTIONS Please see medications section of this packet for any medication changes.  Please let PCP/Specialists know of any changes in medications that were made.  You only need to check your blood pressure ever other day at home at the most. You can also check it if you're feeling unwell.  DOCTOR'S APPOINTMENTS & FOLLOW UP Please follow-up with your primary care doctor within 2 weeks.   Thank you for choosing [facility]! Take care and be well!  [facility]  [place], [place] [address] [phone]

## 2020-12-06 NOTE — ED Triage Notes (Signed)
Pt here POV d/t feeling dizzy today. Pt reports BM with bright red pain. Reports hemmroids. Alert and oriented X4. Denies CP

## 2020-12-07 DIAGNOSIS — K649 Unspecified hemorrhoids: Secondary | ICD-10-CM

## 2020-12-07 DIAGNOSIS — R030 Elevated blood-pressure reading, without diagnosis of hypertension: Secondary | ICD-10-CM

## 2020-12-07 DIAGNOSIS — I160 Hypertensive urgency: Secondary | ICD-10-CM

## 2020-12-07 DIAGNOSIS — I129 Hypertensive chronic kidney disease with stage 1 through stage 4 chronic kidney disease, or unspecified chronic kidney disease: Secondary | ICD-10-CM | POA: Diagnosis not present

## 2020-12-07 LAB — CBC
HCT: 39.1 % (ref 36.0–46.0)
Hemoglobin: 12.9 g/dL (ref 12.0–15.0)
MCH: 29.5 pg (ref 26.0–34.0)
MCHC: 33 g/dL (ref 30.0–36.0)
MCV: 89.5 fL (ref 80.0–100.0)
Platelets: 217 10*3/uL (ref 150–400)
RBC: 4.37 MIL/uL (ref 3.87–5.11)
RDW: 14.3 % (ref 11.5–15.5)
WBC: 6.6 10*3/uL (ref 4.0–10.5)
nRBC: 0 % (ref 0.0–0.2)

## 2020-12-07 LAB — BASIC METABOLIC PANEL
Anion gap: 10 (ref 5–15)
BUN: 12 mg/dL (ref 8–23)
CO2: 24 mmol/L (ref 22–32)
Calcium: 10 mg/dL (ref 8.9–10.3)
Chloride: 105 mmol/L (ref 98–111)
Creatinine, Ser: 1.12 mg/dL — ABNORMAL HIGH (ref 0.44–1.00)
GFR, Estimated: 48 mL/min — ABNORMAL LOW (ref >60)
Glucose, Bld: 118 mg/dL — ABNORMAL HIGH (ref 70–99)
Potassium: 3.8 mmol/L (ref 3.5–5.1)
Sodium: 139 mmol/L (ref 135–145)

## 2020-12-07 MED ORDER — HYDROCORTISONE (PERIANAL) 2.5 % EX CREA
1.0000 "application " | TOPICAL_CREAM | Freq: Two times a day (BID) | CUTANEOUS | Status: DC | PRN
  Filled 2020-12-07: qty 28.35

## 2020-12-07 MED ORDER — FUROSEMIDE 20 MG PO TABS: 20.0000 mg | ORAL_TABLET | Freq: Every day | ORAL | Status: DC

## 2020-12-07 NOTE — Care Management Obs Status (Signed)
[facility] NOTIFICATION   Patient Details  Name: Jodi [name] MRN: [id number] Date of Birth: 10-Oct-1932   Medicare Observation Status Notification Given:  Yes    Sharin Mons, RN 12/07/2020, 10:19 AM

## 2020-12-07 NOTE — Evaluation (Signed)
Physical Therapy Evaluation Patient Details Name: Jodi [name] MRN: [id number] DOB: 07/08/1932 Today's Date: 12/07/2020  History of Present Illness  Jodi [name] is a 87 y.o. female presenting with dizziness, high blood pressure, and bright red blood per rectum. PMH is significant for HTN, A. fib on Eliquis, history CVA x2 2021, CKD stage IIIb, external hemorrhoids, constipation, HLD, adenocarcinoma of uterus, stomach cancer, partial nephrectomy for kidney tumor  Clinical Impression  Pt admitted with above. Prior to admission, pt uses a cane for household ambulation and is independent with ADL's. Pt presents likely close to her functional baseline. Pt reports improvement in dizziness. Her systolic BP did drop from sitting to standing, but was not positive for orthostatic hypotension (see below). Pt ambulating 200 feet with a single point cane at a supervision level. No further acute PT needs. Thank you for this consult.   Vitals: BP sitting: 161/64 (95) BP standing: 148/70 (94)   Recommendations for follow up therapy are one component of a multi-disciplinary discharge planning process, led by the attending physician.  Recommendations may be updated based on patient status, additional functional criteria and insurance authorization.  Follow Up Recommendations No PT follow up;Supervision for mobility/OOB    Equipment Recommendations  None recommended by PT    Recommendations for Other Services       Precautions / Restrictions Precautions Precautions: Fall Restrictions Weight Bearing Restrictions: No      Mobility  Bed Mobility               General bed mobility comments: OOB in chair    Transfers Overall transfer level: Needs assistance Equipment used: Straight cane Transfers: Sit to/from Stand Sit to Stand: Supervision         General transfer comment: for safety only  Ambulation/Gait Ambulation/Gait assistance: Supervision Gait Distance (Feet): 200  Feet Assistive device: Straight cane Gait Pattern/deviations: Step-through pattern;Decreased stride length Gait velocity: decreased   General Gait Details: slow and steady pace, mild dynamic instability, supervision for safety  Stairs            Wheelchair Mobility    Modified Rankin (Stroke Patients Only)       Balance Overall balance assessment: Mild deficits observed, not formally tested (Pt has some residual BLE impairments from CVAs in 2021 - some balance deficts noted however pt at supervision level for functional ambulation with [facility])                                           Pertinent Vitals/Pain Pain Assessment: No/denies pain    Home Living Family/patient expects to be discharged to:: Private residence Living Arrangements: Alone Available Help at Discharge: Family;Available PRN/intermittently Type of Home: Apartment Home Access: Stairs to enter Entrance Stairs-Rails: Right Entrance Stairs-Number of Steps: 3 Home Layout: One level Home Equipment: Walker - 2 wheels;Cane - single point;Toilet riser;Wheelchair - manual Additional Comments: pt sponge bathes at the sink    Prior Function Level of Independence: Independent with assistive device(s)         Comments: Pt uses SPC for all mobility, sponge bathes at the sink, indep for ADLs, daughter assists with heavy IADLs. does not drive     Hand Dominance   Dominant Hand: Left    Extremity/Trunk Assessment   Upper Extremity Assessment Upper Extremity Assessment: Overall WFL for tasks assessed    Lower Extremity Assessment Lower Extremity Assessment: RLE deficits/detail;LLE  deficits/detail RLE Deficits / Details: Grossly 4+/5 LLE Deficits / Details: Grossly 4+/5    Cervical / Trunk Assessment Cervical / Trunk Assessment: Kyphotic (Mild)  Communication   Communication: No difficulties  Cognition Arousal/Alertness: Awake/alert Behavior During Therapy: WFL for tasks  assessed/performed Overall Cognitive Status: Within Functional Limits for tasks assessed                                        General Comments      Exercises     Assessment/Plan    PT Assessment Patent does not need any further PT services  PT Problem List         PT Treatment Interventions      PT Goals (Current goals can be found in the Care Plan section)  Acute Rehab PT Goals Patient Stated Goal: home today PT Goal Formulation: All assessment and education complete, DC therapy    Frequency     Barriers to discharge        Co-evaluation               AM-PAC PT "6 Clicks" Mobility  Outcome Measure Help needed turning from your back to your side while in a flat bed without using bedrails?: None Help needed moving from lying on your back to sitting on the side of a flat bed without using bedrails?: None Help needed moving to and from a bed to a chair (including a wheelchair)?: None Help needed standing up from a chair using your arms (e.g., wheelchair or bedside chair)?: A Little Help needed to walk in hospital room?: A Little Help needed climbing 3-5 steps with a railing? : A Little 6 Click Score: 21    End of Session   Activity Tolerance: Patient tolerated treatment well Patient left: in chair;with call bell/phone within reach Nurse Communication: Mobility status PT Visit Diagnosis: Difficulty in walking, not elsewhere classified (R26.2)    Time: 6286-3817 PT Time Calculation (min) (ACUTE ONLY): 21 min   Charges:   PT Evaluation $PT Eval Moderate Complexity: 1 Mod          Wyona Almas, PT, DPT Acute Rehabilitation Services Pager [phone] Office [phone]   Deno Etienne 12/07/2020, 1:04 PM

## 2020-12-07 NOTE — Discharge Summary (Signed)
[facility] Discharge Summary  Patient name: Jodi [name] Medical record number: [id number] Date of birth: [birth year] Age: 87 y.o. Gender: female Date of Admission: 12/06/2020  Date of Discharge: 12/07/2020 Admitting Physician: Martyn Malay, MD  Primary Care Provider: Chase Picket, MD Consultants: None  Indication for Hospitalization: Elevated BP  Discharge Diagnoses/Problem List:  Hypertension A-Fib Hx of CVA x2 CKD Stage IIIb External Hemorrhoids Constipation HLD Adenocarcinoma of uterus 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S/p partial nephrectomy  Disposition: Home  Discharge Condition: Improved, stable  Discharge Exam:  General: alert, non-toxic appearing, NAD CV: RRR, normal S1/S2 Resp: normal effort, lungs CTAB Neuro: oriented x3, grossly intact  Brief Hospital Course:  Jodi [name] is an 87 y.o. female admitted for hypertension and dizziness.   Hypertension Patient presented after she checked her BP at home and it was in the 258N systolic. She had associated dizziness and was concerned about the possibility of stroke (hx of CVA x2 previously). In the ED initial BP 155/63. Orthostatic vitals were positive, so she was started on fluids. Her BP then rose to 277O-242P systolic and did not respond to a dose of IV hydral and IV labetalol so family medicine team was consulted for admission. Workup including basic labs, CXR and MRI brain were unremarkable.  After discontinuing fluids and resuming her home BP medications, her BP improved to 536R systolic. Patient felt well and was discharged home with outpatient follow-up.  Ultimately her dizziness thought to be vagal in nature. Medications could have been contributing as well, therefore her Verapamil was held on discharge.  Remainder of problems were chronic and stable and did not require attention during this very brief hospital admission. Of note, she did report some intermittent rectal bleeding related  to her hemorrhoids, but Hgb was stable and at baseline (12.2 > 12.9).  Issues for Follow Up:  Verapamil was held on discharge, as patient was taking both Coreg and Verapamil prior to admission. Recommend discussing this with her PCP and cardiologist.  Significant Procedures: None  Significant Labs and Imaging:  Recent Labs  Lab 12/06/20 1104 12/07/20 0244  WBC 6.0 6.6  HGB 12.2 12.9  HCT 39.7 39.1  PLT 229 217   Recent Labs  Lab 12/06/20 1104 12/07/20 0244  NA 137 139  K 4.1 3.8  CL 103 105  CO2 26 24  GLUCOSE 115* 118*  BUN 13 12  CREATININE 1.17* 1.12*  CALCIUM 9.8 10.0  ALKPHOS 88  --   AST 22  --   ALT 13  --   ALBUMIN 3.8  --     CT Head Wo Contrast  Result Date: 11/29/2020 CLINICAL DATA:  Stroke, fall EXAM: CT HEAD WITHOUT CONTRAST TECHNIQUE: Contiguous axial images were obtained from the base of the skull through the vertex without intravenous contrast. COMPARISON:  08/02/2019 FINDINGS: Brain: There is atrophy and chronic small vessel disease changes. No acute intracranial abnormality. Specifically, no hemorrhage, hydrocephalus, mass lesion, acute infarction, or significant intracranial injury. Vascular: No hyperdense vessel or unexpected calcification. Skull: No acute calvarial abnormality. Sinuses/Orbits: No acute findings Other: None IMPRESSION: Atrophy, chronic microvascular disease. No acute intracranial abnormality. Electronically Signed   By: Rolm Baptise M.D.   On: 11/29/2020 20:38   MR BRAIN WO CONTRAST  Result Date: 12/06/2020 CLINICAL DATA:  Neuro deficit, acute, stroke suspected. Episode of dizziness with hypertension. EXAM: MRI HEAD WITHOUT CONTRAST TECHNIQUE: Multiplanar, multiecho pulse sequences of the brain and surrounding structures were obtained without intravenous contrast. COMPARISON:  Head CT  1 week ago.  MRI 08/03/2019. FINDINGS: Brain: Diffusion imaging does not show any acute or subacute infarction. Chronic small-vessel ischemic changes  affect the pons. Old small vessel cerebellar infarctions, including an old hemorrhagic infarction of the medial left cerebellum. Cerebral hemispheres show moderate to marked changes of chronic small vessel disease affecting the white matter. No cortical or large vessel territory infarction. No mass lesion, hydrocephalus or extra-axial collection. Vascular: Major vessels at the base of the brain show flow. Skull and upper cervical spine: Negative Sinuses/Orbits: Paranasal sinuses are clear. Orbits are negative. Previous lens implant on the left. Other: Small right mastoid effusion. IMPRESSION: No acute intracranial finding. Extensive chronic ischemic changes throughout the brain as outlined above. Electronically Signed   By: Nelson Chimes M.D.   On: 12/06/2020 13:00   DG Chest Port 1 View  Result Date: 12/06/2020 CLINICAL DATA:  Hypertension. EXAM: PORTABLE CHEST 1 VIEW COMPARISON:  None. FINDINGS: Normal cardiomediastinal contours. No pleural effusion or edema identified. No airspace consolidation identified. Right first, third, fourth, and fifth rib deformities are identified likely sequelae of remote trauma. No acute osseous findings. IMPRESSION: No acute cardiopulmonary abnormalities. Electronically Signed   By: Kerby Moors M.D.   On: 12/06/2020 11:47     Results/Tests Pending at Time of Discharge: None  Discharge Medications:  Allergies as of 12/07/2020       Reactions   Aciphex [rabeprazole]    Hives   Aspirin    Anaphylaxis   Hydralazine    Nausea   Hydroquinone Other (See Comments)   Morphine And Related    Emesis        Medication List     STOP taking these medications    doxycycline 100 MG capsule Commonly known as: VIBRAMYCIN   metoprolol tartrate 25 MG tablet Commonly known as: LOPRESSOR   verapamil 80 MG tablet Commonly known as: CALAN       TAKE these medications    acetaminophen 325 MG tablet Commonly known as: TYLENOL Take 2 tablets (650 mg total) by  mouth every 4 (four) hours as needed for mild pain (or temp > 37.5 C (99.5 F)).   albuterol 108 (90 Base) MCG/ACT inhaler Commonly known as: VENTOLIN HFA Inhale 2 puffs into the lungs every 4 (four) hours as needed for wheezing or shortness of breath. What changed: reasons to take this   apixaban 5 MG Tabs tablet Commonly known as: ELIQUIS Take 1 tablet (5 mg total) by mouth 2 (two) times daily.   atorvastatin 40 MG tablet Commonly known as: LIPITOR Take 1 tablet (40 mg total) by mouth at bedtime.   carvedilol 12.5 MG tablet Commonly known as: COREG Take 12.5 mg by mouth 2 (two) times daily.   furosemide 20 MG tablet Commonly known as: LASIX Take 1 tablet (20 mg total) by mouth daily.   hydrocortisone 25 MG suppository Commonly known as: ANUSOL-HC Place 1 suppository (25 mg total) rectally 2 (two) times daily.   lisinopril 20 MG tablet Commonly known as: ZESTRIL Take 20 mg by mouth daily.   polyethylene glycol 17 g packet Commonly known as: MIRALAX / GLYCOLAX Take 17 g by mouth daily.   Vitamin D 50 MCG (2000 UT) Caps Take 1 capsule (2,000 Units total) by mouth daily.        Discharge Instructions: Please refer to Patient Instructions section of EMR for full details.  Patient was counseled important signs and symptoms that should prompt return to medical care, changes in medications, dietary instructions,  activity restrictions, and follow up appointments.   Follow-Up Appointments: Patient instructed to follow-up with her PCP within 2 weeks of discharge.   Alcus Dad, MD 12/07/2020, 2:19 PM PGY-2, [place]

## 2020-12-07 NOTE — Progress Notes (Signed)
Family Medicine Teaching Service Daily Progress Note Intern Pager: [phone]  Patient name: Jodi [name] Medical record number: [id number] Date of birth: February 09, 1933 Age: 87 y.o. Gender: female  Primary Care Provider: Chase Picket, MD Consultants: None Code Status: Full code  Pt Overview and Major Events to Date:  10/22 admitted  Assessment and Plan: Jodi [name] is a 87 y.o. female presenting with dizziness, high blood pressure, and bright red blood per rectum. PMH is significant for HTN, A. fib on Eliquis, history CVA x2, CKD stage IIIb, external hemorrhoids, constipation, HLD, adenocarcinoma of uterus, stomach cancer, partial nephrectomy for kidney tumor.    Dizziness and elevated BP  primary hypertension Morning blood pressure while standing 144/58.  Patient does report feeling dizzy when she stands up.  Received carvedilol, furosemide, lisinopril last night.  Given blood pressure today, will continue to hold verapamil unless her blood pressure starts to climb.  OT has evaluated, has no further recommendations. - PT eval and treat, may need home health services - Awaiting a.m. repeat orthostatics - Continue home BP medications of carvedilol, furosemide, lisinopril - Hold verapamil - RN to measure BP while patient is standing  - aim to titrate standing diastolic to normal pressure as to avoid orthostatic hypotension and falls at home - possible discharge later today  A. 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  Continues to have small amounts of bright red blood per rectum.  Hemodynamically stable. - continue Eliquis 5 mg twice daily due to high risk of cardio-embolic stroke   CKD stage 3b  Hx partial nephrectomy Creatinine this morning 1.12, stable from 1.17 on admission.  Potassium 3.8, stable - Continue frusemide 20 mg daily - A.m. BMP for potassium measurement   External hemorrhoids  bright red blood per  rectum  history constipation She continues to have small amounts of moderate blood per rectum with bowel movements.  A.m. hemoglobin 12.9, currently at baseline and stable. - A.m. CBC  - Tucks pads as needed, Preparation H as needed - monitor blood loss - MiraLAX twice daily, senna daily    FEN/GI: Regular diet PPx: Full dose Eliquis Dispo:Pending PT recommendations   within 36 hours . Barriers include PT evaluation, possible home health coordination.   Subjective:  Jodi [name] was found sitting in her recliner at bedside, in good spirits.  She reports feeling much better this morning.  She does state that she is getting quite dizzy when standing up.  RN has been measuring her blood pressure while standing up so we can titrate her standing diastolic.  She says the nausea and dizziness at rest are vastly improved.  Objective: Temp:  [98.4 F (36.9 C)-98.7 F (37.1 C)] 98.4 F (36.9 C) (10/23 0325) Pulse Rate:  [51-98] 80 (10/23 0325) Resp:  [12-20] 18 (10/23 0325) BP: (144-199)/(58-83) 144/58 (10/23 0325) SpO2:  [98 %-100 %] 98 % (10/23 0325) Weight:  [65.5 kg] 65.5 kg (10/23 0325) Physical Exam: General: Awake, alert, oriented Cardiovascular: Irregular rhythm, bigeminy, no murmurs appreciated Respiratory: Clear to auscultation bilaterally  Laboratory: Recent Labs  Lab 12/06/20 1104 12/07/20 0244  WBC 6.0 6.6  HGB 12.2 12.9  HCT 39.7 39.1  PLT 229 217   Recent Labs  Lab 12/06/20 1104 12/07/20 0244  NA 137 139  K 4.1 3.8  CL 103 105  CO2 26 24  BUN 13 12  CREATININE 1.17* 1.12*  CALCIUM 9.8 10.0  PROT 6.6  --  BILITOT 0.7  --   ALKPHOS 88  --   ALT 13  --   AST 22  --   GLUCOSE 115* 118*    Imaging/Diagnostic Tests: No new since H&P  Ezequiel Essex, MD 12/07/2020, 10:06 AM PGY-2, [place] Intern pager: [phone], text pages welcome

## 2020-12-07 NOTE — Evaluation (Signed)
Occupational Therapy Evaluation Patient Details Name: Jodi [name] MRN: [id number] DOB: 12-14-1932 Today's Date: 12/07/2020   History of Present Illness Jodi [name] is a 87 y.o. female presenting with dizziness, high blood pressure, and bright red blood per rectum. PMH is significant for HTN, A. fib on Eliquis, history CVA x2 2021, CKD stage IIIb, external hemorrhoids, constipation, HLD, adenocarcinoma of uterus, stomach cancer, partial nephrectomy for kidney tumor   Clinical Impression   [name] was mod I with use of a SPC for all mobility and ADLs. She lives in a 1 level home with 3 STE, her daughter lives close by and can assist as needed; pt manages her meds and finances, daughter assists with heavy housework and driving. Upon evaluation, pt completed all BADLs while standing/sitting at the sink with supervision for safety only. Denied dizziness, no SOB. Functional mobility with SPC, supervision. Pt is at her baseline, no further acute OT needs at this time. Recommend d/c to home with supervision for ADLs and mobility initially.      Recommendations for follow up therapy are one component of a multi-disciplinary discharge planning process, led by the attending physician.  Recommendations may be updated based on patient status, additional functional criteria and insurance authorization.   Follow Up Recommendations  No OT follow up;Supervision - Intermittent    Equipment Recommendations  None recommended by OT       Precautions / Restrictions Precautions Precautions: Fall Restrictions Weight Bearing Restrictions: No      Mobility Bed Mobility Overal bed mobility: Needs Assistance Bed Mobility: Supine to Sit     Supine to sit: Supervision;HOB elevated     General bed mobility comments: for safety only    Transfers Overall transfer level: Needs assistance Equipment used:  [facility]) Transfers: Sit to/from Stand Sit to Stand: Supervision         General transfer  comment: for safety only    Balance Overall balance assessment: Mild deficits observed, not formally tested (Pt has some residual BLE impairments from CVAs in 2021 - some balance deficts noted however pt at supervision level for functional ambulation wtih [facility])                                         ADL either performed or assessed with clinical judgement   ADL Overall ADL's : Needs assistance/impaired Eating/Feeding: Independent;Sitting   Grooming: Supervision/safety;Standing   Upper Body Bathing: Supervision/ safety;Standing   Lower Body Bathing: Supervison/ safety;Sit to/from stand   Upper Body Dressing : Supervision/safety;Standing   Lower Body Dressing: Supervision/safety;Sit to/from stand   Toilet Transfer: Supervision/safety;Ambulation [facility])   Toileting- Clothing Manipulation and Hygiene: Supervision/safety;Sit to/from stand       Functional mobility during ADLs: Supervision/safety [facility]) General ADL Comments: pt completed full BADL session with supervision for safety - tolerated standing at the sink durign funcitonal task for ~10 minutes, only required 1 sit down break. This is pt's baseline. denied dizziness. SPC for functional mobility, supervision for safety only     Vision   Vision Assessment?: No apparent visual deficits     Perception     Praxis      Pertinent Vitals/Pain Pain Assessment: No/denies pain     Hand Dominance Left   Extremity/Trunk Assessment Upper Extremity Assessment Upper Extremity Assessment: Overall WFL for tasks assessed (Pt has mass on R shouler, per pt it is only painful when laying on that side  for a prolonged period of time)   Lower Extremity Assessment Lower Extremity Assessment: Defer to PT evaluation   Cervical / Trunk Assessment Cervical / Trunk Assessment: Kyphotic (Mild)   Communication Communication Communication: No difficulties   Cognition Arousal/Alertness: Awake/alert Behavior During Therapy:  WFL for tasks assessed/performed Overall Cognitive Status: Within Functional Limits for tasks assessed                                     General Comments  VSS on RA    Exercises     Shoulder Instructions      Home Living Family/patient expects to be discharged to:: Private residence Living Arrangements: Alone Available Help at Discharge: Family;Available PRN/intermittently Type of Home: Apartment Home Access: Stairs to enter Entrance Stairs-Number of Steps: 3 Entrance Stairs-Rails: Right Home Layout: One level     Bathroom Shower/Tub: Teacher, early years/pre: Standard (with riser)     Home Equipment: Walker - 2 wheels;Cane - single point;Toilet riser;Wheelchair - manual   Additional Comments: pt sponge bathes at the sink      Prior Functioning/Environment Level of Independence: Independent with assistive device(s)        Comments: Pt uses SPC for all mobility, sponge bathes at the sink, indep for ADLs, daughter assists with heavy IADLs. does not drive        OT Problem List: Decreased activity tolerance;Impaired balance (sitting and/or standing);Decreased safety awareness;Pain      OT Treatment/Interventions:      OT Goals(Current goals can be found in the care plan section) Acute Rehab OT Goals Patient Stated Goal: home today OT Goal Formulation: All assessment and education complete, DC therapy  OT Frequency:     Barriers to D/C:            Co-evaluation              AM-PAC OT "6 Clicks" Daily Activity     Outcome Measure Help from another person eating meals?: None Help from another person taking care of personal grooming?: None Help from another person toileting, which includes using toliet, bedpan, or urinal?: None Help from another person bathing (including washing, rinsing, drying)?: None Help from another person to put on and taking off regular upper body clothing?: None Help from another person to put on and  taking off regular lower body clothing?: None 6 Click Score: 24   End of Session Equipment Utilized During Treatment: Gait belt [facility]) Nurse Communication: Mobility status  Activity Tolerance: Patient tolerated treatment well Patient left: in chair;with call bell/phone within reach  OT Visit Diagnosis: Other abnormalities of gait and mobility (R26.89);Dizziness and giddiness (R42)                Time: 7353-2992 OT Time Calculation (min): 26 min Charges:  OT General Charges $OT Visit: 1 Visit OT Evaluation $OT Eval Moderate Complexity: 1 Mod OT Treatments $Self Care/Home Management : 8-22 mins   Coy Vandoren A Ashayla Subia 12/07/2020, 9:03 AM

## 2020-12-07 NOTE — Progress Notes (Signed)
Discharge instructions (including medications) discussed with and copy provided to patient/caregiver. All belongings sent with patient. 

## 2020-12-07 NOTE — Progress Notes (Signed)
FPTS Brief Progress Note  S: Patient comfortable in bed, no complaints at this time.  O: BP (!) 157/83 (BP Location: Left Arm)   Pulse 92   Temp 98.7 F (37.1 C) (Oral)   Resp 20   SpO2 100%    A/P: No changes to plan outlined in H&P several hours earlier.  Orders reviewed.  A.m. labs in.   Ezequiel Essex, MD 12/07/2020, 12:56 AM PGY-2, [place] [facility] Night Resident  Please page [phone] with questions.

## 2021-01-05 ENCOUNTER — Emergency Department (HOSPITAL_COMMUNITY)
Admission: EM | Admit: 2021-01-05 | Discharge: 2021-01-05 | Disposition: A | Discharge status: Home / Self Care | Payer: MEDICARE | Attending: Emergency Medicine | Admitting: Emergency Medicine

## 2021-01-05 ENCOUNTER — Encounter (HOSPITAL_COMMUNITY): Payer: Self-pay | Admitting: Emergency Medicine

## 2021-01-05 DIAGNOSIS — Z85028 Personal history of other malignant neoplasm of stomach: Secondary | ICD-10-CM | POA: Diagnosis not present

## 2021-01-05 DIAGNOSIS — Z79899 Other long term (current) drug therapy: Secondary | ICD-10-CM | POA: Diagnosis not present

## 2021-01-05 DIAGNOSIS — K644 Residual hemorrhoidal skin tags: Secondary | ICD-10-CM | POA: Diagnosis not present

## 2021-01-05 DIAGNOSIS — I2510 Atherosclerotic heart disease of native coronary artery without angina pectoris: Secondary | ICD-10-CM | POA: Diagnosis not present

## 2021-01-05 DIAGNOSIS — Z20822 Contact with and (suspected) exposure to covid-19: Secondary | ICD-10-CM | POA: Diagnosis not present

## 2021-01-05 DIAGNOSIS — N1832 Chronic kidney disease, stage 3b: Secondary | ICD-10-CM | POA: Insufficient documentation

## 2021-01-05 DIAGNOSIS — I129 Hypertensive chronic kidney disease with stage 1 through stage 4 chronic kidney disease, or unspecified chronic kidney disease: Secondary | ICD-10-CM | POA: Diagnosis not present

## 2021-01-05 DIAGNOSIS — I4891 Unspecified atrial fibrillation: Secondary | ICD-10-CM | POA: Diagnosis not present

## 2021-01-05 DIAGNOSIS — Z7901 Long term (current) use of anticoagulants: Secondary | ICD-10-CM | POA: Diagnosis not present

## 2021-01-05 DIAGNOSIS — K625 Hemorrhage of anus and rectum: Secondary | ICD-10-CM | POA: Diagnosis present

## 2021-01-05 LAB — URINALYSIS, ROUTINE W REFLEX MICROSCOPIC
Bilirubin Urine: NEGATIVE
Glucose, UA: NEGATIVE mg/dL
Ketones, ur: NEGATIVE mg/dL
Leukocytes,Ua: NEGATIVE
Nitrite: NEGATIVE
Protein, ur: NEGATIVE mg/dL
Specific Gravity, Urine: 1.004 — ABNORMAL LOW (ref 1.005–1.030)
pH: 7 (ref 5.0–8.0)

## 2021-01-05 LAB — CBC WITH DIFFERENTIAL/PLATELET
Abs Immature Granulocytes: 0.01 10*3/uL (ref 0.00–0.07)
Basophils Absolute: 0 10*3/uL (ref 0.0–0.1)
Basophils Relative: 1 %
Eosinophils Absolute: 0 10*3/uL (ref 0.0–0.5)
Eosinophils Relative: 1 %
HCT: 39.4 % (ref 36.0–46.0)
Hemoglobin: 12.7 g/dL (ref 12.0–15.0)
Immature Granulocytes: 0 %
Lymphocytes Relative: 20 %
Lymphs Abs: 1.2 10*3/uL (ref 0.7–4.0)
MCH: 29.8 pg (ref 26.0–34.0)
MCHC: 32.2 g/dL (ref 30.0–36.0)
MCV: 92.5 fL (ref 80.0–100.0)
Monocytes Absolute: 0.4 10*3/uL (ref 0.1–1.0)
Monocytes Relative: 6 %
Neutro Abs: 4.2 10*3/uL (ref 1.7–7.7)
Neutrophils Relative %: 72 %
Platelets: 221 10*3/uL (ref 150–400)
RBC: 4.26 MIL/uL (ref 3.87–5.11)
RDW: 14.5 % (ref 11.5–15.5)
WBC: 5.7 10*3/uL (ref 4.0–10.5)
nRBC: 0 % (ref 0.0–0.2)

## 2021-01-05 LAB — RESP PANEL BY RT-PCR (FLU A&B, COVID) ARPGX2
Influenza A by PCR: NEGATIVE
Influenza B by PCR: NEGATIVE
SARS Coronavirus 2 by RT PCR: NEGATIVE

## 2021-01-05 LAB — COMPREHENSIVE METABOLIC PANEL
ALT: 14 U/L (ref 0–44)
AST: 22 U/L (ref 15–41)
Albumin: 3.7 g/dL (ref 3.5–5.0)
Alkaline Phosphatase: 87 U/L (ref 38–126)
Anion gap: 9 (ref 5–15)
BUN: 10 mg/dL (ref 8–23)
CO2: 27 mmol/L (ref 22–32)
Calcium: 9.9 mg/dL (ref 8.9–10.3)
Chloride: 101 mmol/L (ref 98–111)
Creatinine, Ser: 1.3 mg/dL — ABNORMAL HIGH (ref 0.44–1.00)
GFR, Estimated: 40 mL/min — ABNORMAL LOW (ref >60)
Glucose, Bld: 129 mg/dL — ABNORMAL HIGH (ref 70–99)
Potassium: 3.9 mmol/L (ref 3.5–5.1)
Sodium: 137 mmol/L (ref 135–145)
Total Bilirubin: 0.9 mg/dL (ref 0.3–1.2)
Total Protein: 6.9 g/dL (ref 6.5–8.1)

## 2021-01-05 NOTE — ED Provider Notes (Signed)
[facility] EMERGENCY DEPARTMENT Provider Note   CSN: [id number] Arrival date & time: 01/05/21  1033     History Chief Complaint  Patient presents with   Rectal Bleeding    Jodi [name] is a 87 y.o. female is a patient with known history of hemorrhoids anticoagulated on Eliquis for atrial fibrillation who presents today with concern for several episodes of passing thumb sized blood clots in her stool over the last 2 days.  Bleeding only occurs at time of bowel movements and there is no associated pain.  Patient does endorse chronic history of constipation and states that she has to strain to pass bowel movements but denies any abdominal pain, melena, or hematochezia.  She denies any fevers, chills, nausea, vomiting, confusion, or other symptoms recently.  I personally reviewed the patient's medical records.  She has history of CKD, A. fib, hypertension, embolic stroke on Eliquis. 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[facility])     Patient Active Problem List   Diagnosis Date Noted   Dizziness 12/06/2020   Benign essential HTN    Labile blood pressure    Acute blood loss anemia    Constipation    Hypoalbuminemia due to protein-calorie malnutrition (HCC)    Atrial fibrillation ([place]) 08/07/2019   History of embolic stroke 25/85/2778   Essential hypertension 08/07/2019   Hyperlipidemia 08/07/2019   CKD (chronic kidney disease), stage IIIb 08/07/2019   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     Family History  Problem Relation Age of Onset   Prostate cancer Father     Social History   Tobacco Use   Smoking status: Never   Smokeless tobacco: Never  Substance Use Topics   Alcohol use: Not Currently   Drug use: Not Currently    Home Medications Prior to Admission medications   Medication Sig Start Date End Date Taking? Authorizing Provider  acetaminophen (TYLENOL) 325 MG tablet Take 2 tablets (650 mg total) by mouth every 4 (four) hours as needed for mild pain (or temp > 37.5 C (99.5 F)). 08/09/19   Angiulli, Lavon Paganini, PA-C  albuterol (VENTOLIN HFA) 108 (90 Base) MCG/ACT inhaler Inhale 2 puffs into the lungs every 4 (four) hours as needed for wheezing or shortness of breath. Patient taking differently: Inhale 2 puffs into the lungs every 4 (four) hours as needed for shortness of breath. 08/09/19   Angiulli, Lavon Paganini, PA-C  apixaban (ELIQUIS) 5 MG TABS tablet Take 1 tablet (5 mg total) by mouth 2 (two) times daily. 08/09/19   Angiulli, Lavon Paganini, PA-C  atorvastatin (LIPITOR) 40 MG  tablet Take 1 tablet (40 mg total) by mouth at bedtime. 08/09/19   Angiulli, Lavon Paganini, PA-C  carvedilol (COREG) 12.5 MG tablet Take 12.5 mg by mouth 2 (two) times daily. 09/23/20   [provider]  Cholecalciferol (VITAMIN D) 50 MCG (2000 UT) CAPS Take 1 capsule (2,000 Units total) by mouth daily. 08/09/19   Angiulli, Lavon Paganini, PA-C  furosemide (LASIX) 20 MG tablet Take 1 tablet (20 mg total) by mouth daily. 08/09/19   Angiulli, Lavon Paganini, PA-C  hydrocortisone (ANUSOL-HC) 25 MG suppository Place 1 suppository (25 mg total) rectally 2 (two) times daily. 12/06/20   Isla Pence, MD  lisinopril (ZESTRIL) 20 MG tablet Take 20 mg by mouth daily. 09/23/20   [provider]  polyethylene glycol (MIRALAX / GLYCOLAX) 17 g packet Take 17 g by mouth daily. Patient not taking: No sig reported 08/08/19   Donzetta Starch, NP    Allergies    Aciphex [rabeprazole], Aspirin, Hydralazine, Hydroquinone, and Morphine and related  Review of Systems   Review of Systems  Constitutional: Negative.   HENT: Negative.    Eyes: Negative.   Respiratory: Negative.    Cardiovascular: Negative.   Gastrointestinal:  Positive for anal bleeding and constipation. Negative for abdominal distention, abdominal pain, blood in stool, diarrhea, rectal pain and vomiting.  Genitourinary: Negative.   Musculoskeletal: Negative.   Skin: Negative.   Neurological: Negative.    Physical Exam Updated Vital Signs BP (!) 159/82   Pulse 69   Temp 98.2 F (36.8 C)   Resp 17   SpO2 99%   Physical Exam Vitals and nursing note reviewed. Exam conducted with a chaperone present.  Constitutional:      Appearance: She is not ill-appearing or toxic-appearing.  HENT:     Head: Normocephalic and atraumatic.     Nose: Nose normal. No congestion.     Mouth/Throat:     Mouth: Mucous membranes are moist.     Pharynx: No oropharyngeal exudate or posterior oropharyngeal erythema.  Eyes:     General:        Right eye: No discharge.        Left eye: No discharge.     Extraocular Movements: Extraocular movements intact.     Conjunctiva/sclera: Conjunctivae normal.     Pupils: Pupils are equal, round, and reactive to light.  Cardiovascular:     Rate and Rhythm: Normal rate and regular rhythm.     Pulses: Normal pulses.     Heart sounds: Normal heart sounds. No murmur heard. Pulmonary:     Effort: Pulmonary effort is normal. No respiratory distress.     Breath sounds: Normal breath sounds. No wheezing or rales.  Abdominal:     General: Bowel sounds are normal. There is no distension.      Palpations: Abdomen is soft.     Tenderness: There is no abdominal tenderness. There is no right CVA tenderness, left CVA tenderness, guarding or rebound.  Genitourinary:    Rectum: External hemorrhoid present. No mass or anal fissure. Normal anal tone.     Comments: 2.5 cm external hemorrhoid, nonthrombosed, oozing a small amount of blood.  Nonmelanotic appearing stool on the gloved finger of this provider following rectal exam.  Hemoccult not run, as would have been positive due to oozing from hemorrhoid.  Musculoskeletal:        General: No deformity.     Cervical back: Neck supple. No tenderness.     Right lower leg: No edema.  Left lower leg: No edema.  Lymphadenopathy:     Cervical: No cervical adenopathy.  Skin:    General: Skin is warm and dry.     Capillary Refill: Capillary refill takes less than 2 seconds.     Findings: No rash.  Neurological:     General: No focal deficit present.     Mental Status: She is alert and oriented to person, place, and time. Mental status is at baseline.     Gait: Gait is intact. Gait normal.  Psychiatric:        Mood and Affect: Mood normal.    ED Results / Procedures / Treatments   Labs (all labs ordered are listed, but only abnormal results are displayed) Labs Reviewed  COMPREHENSIVE METABOLIC PANEL - Abnormal; Notable for the following components:      Result Value   Glucose, Bld 129 (*)    Creatinine, Ser 1.30 (*)    GFR, Estimated 40 (*)    All other components within normal limits  URINALYSIS, ROUTINE W REFLEX MICROSCOPIC - Abnormal; Notable for the following components:   Color, Urine STRAW (*)    APPearance HAZY (*)    Specific Gravity, Urine 1.004 (*)    Hgb urine dipstick MODERATE (*)    Bacteria, UA RARE (*)    All other components within normal limits  RESP PANEL BY RT-PCR (FLU A&B, COVID) ARPGX2  CBC WITH DIFFERENTIAL/PLATELET  TYPE AND SCREEN  ABO/RH    EKG None  Radiology No results  found.  Procedures Procedures   Medications Ordered in ED Medications - No data to display  ED Course  I have reviewed the triage vital signs and the nursing notes.  Pertinent labs & imaging results that were available during my care of the patient were reviewed by me and considered in my medical decision making (see chart for details).  Clinical Course as of 01/05/21 1608  Mon Jan 06, 6359  8857 87 year old female here for painless rectal bleeding.  She is otherwise in no distress.  Hemoglobin stable.  Rectal exam showing hemorrhoid.  No indications for admission at this time. [MB]    Clinical Course User Index [MB] Hayden Rasmussen, MD   MDM Rules/Calculators/A&P                         87 year old female who presents with concern for anal bleeding x 3 days, hx of hemorrhoids.   Differential diagnosis includes but is not limited to external or internal hemorrhoids that are bleeding, anal fissure or tag, lower GI bleed, upper GI bleed .  Hypertensive on intake, but that was normal.  Cardiopulmonary exam is normal, abdominal exam is benign.  GU exam performed as above with the presence of a chaperone with nonthrombosed external hemorrhoid that is oozing a small amount of blood and normal rectal exam with nonmelanotic appearing stool.  CBC without acute finding.  CMP with creatinine of 1.3 near the patient's baseline.  UA unremarkable and COVID-19 test is negative.  No further work-up warranted in the ER at this time.  Patient's bleeding appears to be secondary to her nonthrombosed external hemorrhoid.  Recommend she continue to use Anusol and Tucks pads as well as to follow-up with her primary care doctor.  Kiyomi voiced understanding of her medical evaluation and treatment plan.  To her questions was answered to her expressed satisfaction.  Return precautions were given.  Patient is well-appearing, stable, and appropriate for  discharge at this time.  This chart was dictated using  voice recognition software, Dragon. Despite the best efforts of this provider to proofread and correct errors, errors may still occur which can change documentation meaning.   Final Clinical Impression(s) / ED Diagnoses Final diagnoses:  External hemorrhoid    Rx / DC Orders ED Discharge Orders     None        Aura Dials 01/05/21 1608    Hayden Rasmussen, MD 01/06/21 [phone]

## 2021-01-05 NOTE — ED Provider Notes (Signed)
Emergency Medicine Provider Triage Evaluation Note  Jodi [name] , a 87 y.o. female  was evaluated in triage.  Pt complains of rectal bleed.  Review of Systems  Positive: Rectal bleed, clots Negative: Rectal pain or itchiness, abd pain, dysuria  Physical Exam  BP (!) 181/65   Pulse (!) 50   Temp 98.2 F (36.8 C)   Resp 18   SpO2 98%  Gen:   Awake, no distress   Resp:  Normal effort MSK:   Moves extremities without difficulty  Other:    Medical Decision Making  Medically screening exam initiated at 10:46 AM.  Appropriate orders placed.  Jodi [name] was informed that the remainder of the evaluation will be completed by another provider, this initial triage assessment does not replace that evaluation, and the importance of remaining in the ED until their evaluation is complete.  Hx of hemorrhoid here with rectal bleeding and clots x 1 week.  Pt on Eliquis.  Denies abd pain.  Did report constipation, taking Miralax.     Domenic Moras, PA-C 01/05/21 1049    Daleen Bo, MD 01/05/21 [phone]

## 2021-01-05 NOTE — ED Triage Notes (Signed)
Patient complains of passing several thumb-sized blood clots in her stool over the last two days. Patient denies pain, denies dizziness, denies weakness. Patient alert, oriented, and in no apparent distress at this time.

## 2021-01-05 NOTE — Discharge Instructions (Signed)
Jodi [name] was seen in the ER today for her anal bleeding.  Her physical exam, vital signs, and blood work are very reassuring.  She has external hemorrhoids.  You may continue your topical steroid cream as well as your Tucks pads.  May follow-up with your primary care doctor.  Return to ER with any new significant rectal bleeding, abdominal pain, nausea or vomiting does not stop, or any other new severe symptoms.

## 2021-01-05 NOTE — ED Notes (Signed)
Patient Alert and oriented to baseline. Stable and ambulatory to baseline. Patient verbalized understanding of the discharge instructions.  Patient belongings were taken by the patient.   

## 2021-01-06 LAB — TYPE AND SCREEN
ABO/RH(D): O POS
Antibody Screen: POSITIVE
PT AG Type: NEGATIVE

## 2021-11-27 ENCOUNTER — Emergency Department (HOSPITAL_COMMUNITY)
Admission: EM | Admit: 2021-11-27 | Discharge: 2021-11-28 | Disposition: A | Discharge status: Home / Self Care | Payer: No Typology Code available for payment source | Attending: Emergency Medicine | Admitting: Emergency Medicine

## 2021-11-27 ENCOUNTER — Encounter (HOSPITAL_COMMUNITY): Payer: Self-pay | Admitting: Emergency Medicine

## 2021-11-27 ENCOUNTER — Other Ambulatory Visit: Payer: Self-pay

## 2021-11-27 DIAGNOSIS — Z79899 Other long term (current) drug therapy: Secondary | ICD-10-CM | POA: Diagnosis not present

## 2021-11-27 DIAGNOSIS — Z7901 Long term (current) use of anticoagulants: Secondary | ICD-10-CM | POA: Insufficient documentation

## 2021-11-27 DIAGNOSIS — I10 Essential (primary) hypertension: Secondary | ICD-10-CM | POA: Diagnosis present

## 2021-11-27 DIAGNOSIS — S0990XA Unspecified injury of head, initial encounter: Secondary | ICD-10-CM | POA: Insufficient documentation

## 2021-11-27 DIAGNOSIS — I6782 Cerebral ischemia: Secondary | ICD-10-CM | POA: Insufficient documentation

## 2021-11-27 MED ORDER — CARVEDILOL 12.5 MG PO TABS
12.5000 mg | ORAL_TABLET | Freq: Two times a day (BID) | ORAL | Status: DC
Start: 2021-11-28 — End: 2021-11-28
  Administered 2021-11-27: 12.5 mg via ORAL
  Filled 2021-11-27: qty 1
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
## 2021-11-28 ENCOUNTER — Emergency Department (HOSPITAL_COMMUNITY): Payer: No Typology Code available for payment source

## 2021-11-28 DIAGNOSIS — I10 Essential (primary) hypertension: Secondary | ICD-10-CM | POA: Diagnosis not present

## 2021-11-28 MED ORDER — CARVEDILOL 12.5 MG PO TABS
12.5000 mg | ORAL_TABLET | ORAL | Status: AC
  Administered 2021-11-28: 12.5 mg via ORAL
  Filled 2021-11-28: qty 1

## 2021-11-28 MED ORDER — LISINOPRIL 20 MG PO TABS
20.0000 mg | ORAL_TABLET | Freq: Once | ORAL | Status: AC
  Administered 2021-11-28: 20 mg via ORAL
  Filled 2021-11-28: qty 1
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
